# Patient Record
Sex: Female | Born: 1960
Health system: Southern US, Community
[De-identification: ages and names within clinical notes are randomized; demographics above are authoritative.]

## PROBLEM LIST (undated history)

## (undated) DIAGNOSIS — L409 Psoriasis, unspecified: Secondary | ICD-10-CM

## (undated) DIAGNOSIS — I951 Orthostatic hypotension: Secondary | ICD-10-CM

## (undated) DIAGNOSIS — C4490 Unspecified malignant neoplasm of skin, unspecified: Secondary | ICD-10-CM

## (undated) DIAGNOSIS — F419 Anxiety disorder, unspecified: Secondary | ICD-10-CM

## (undated) DIAGNOSIS — E079 Disorder of thyroid, unspecified: Secondary | ICD-10-CM

## (undated) DIAGNOSIS — M65839 Other synovitis and tenosynovitis, unspecified forearm: Secondary | ICD-10-CM

## (undated) DIAGNOSIS — M1990 Unspecified osteoarthritis, unspecified site: Secondary | ICD-10-CM

## (undated) DIAGNOSIS — M67439 Ganglion, unspecified wrist: Secondary | ICD-10-CM

## (undated) DIAGNOSIS — R42 Dizziness and giddiness: Secondary | ICD-10-CM

## (undated) DIAGNOSIS — R0602 Shortness of breath: Secondary | ICD-10-CM

## (undated) DIAGNOSIS — K5090 Crohn's disease, unspecified, without complications: Secondary | ICD-10-CM

## (undated) HISTORY — DX: Anxiety disorder, unspecified: F41.9

## (undated) HISTORY — DX: Disorder of thyroid, unspecified: E07.9

## (undated) HISTORY — DX: Dizziness and giddiness: R42

## (undated) HISTORY — DX: Shortness of breath: R06.02

## (undated) HISTORY — PX: OTHER SURGICAL HISTORY: SHX169

## (undated) HISTORY — DX: Psoriasis, unspecified: L40.9

## (undated) HISTORY — DX: Other synovitis and tenosynovitis, unspecified forearm: M65.839

## (undated) HISTORY — DX: Ganglion, unspecified wrist: M67.439

## (undated) HISTORY — DX: Orthostatic hypotension: I95.1

---

## 2001-04-11 HISTORY — PX: BREAST BIOPSY: SHX20

## 2002-04-11 HISTORY — PX: COLON SURGERY: SHX602

## 2002-04-11 HISTORY — PX: BREAST SURGERY: SHX581

## 2003-04-12 HISTORY — PX: REDUCTION MAMMAPLASTY: SUR839

## 2004-03-15 ENCOUNTER — Ambulatory Visit: Payer: Self-pay | Admitting: General Surgery

## 2004-03-19 ENCOUNTER — Ambulatory Visit: Payer: Self-pay | Admitting: General Surgery

## 2004-09-17 ENCOUNTER — Ambulatory Visit: Payer: Self-pay | Admitting: General Surgery

## 2005-03-16 ENCOUNTER — Ambulatory Visit: Payer: Self-pay | Admitting: General Surgery

## 2005-09-29 ENCOUNTER — Ambulatory Visit: Payer: Self-pay | Admitting: Internal Medicine

## 2005-12-27 ENCOUNTER — Ambulatory Visit: Payer: Self-pay | Admitting: Internal Medicine

## 2006-10-09 ENCOUNTER — Ambulatory Visit: Payer: Self-pay | Admitting: Internal Medicine

## 2007-10-10 ENCOUNTER — Ambulatory Visit: Payer: Self-pay | Admitting: Internal Medicine

## 2007-10-11 ENCOUNTER — Ambulatory Visit: Payer: Self-pay | Admitting: Internal Medicine

## 2007-10-17 ENCOUNTER — Ambulatory Visit: Payer: Self-pay | Admitting: Family Medicine

## 2007-11-06 ENCOUNTER — Ambulatory Visit: Payer: Self-pay | Admitting: Internal Medicine

## 2008-09-17 ENCOUNTER — Ambulatory Visit: Payer: Self-pay | Admitting: Internal Medicine

## 2008-10-30 ENCOUNTER — Ambulatory Visit: Payer: Self-pay | Admitting: Internal Medicine

## 2009-11-05 ENCOUNTER — Ambulatory Visit: Payer: Self-pay | Admitting: Internal Medicine

## 2010-12-16 ENCOUNTER — Ambulatory Visit: Payer: Self-pay

## 2010-12-17 ENCOUNTER — Ambulatory Visit: Payer: Self-pay

## 2011-01-26 ENCOUNTER — Ambulatory Visit: Payer: Self-pay

## 2011-03-18 ENCOUNTER — Ambulatory Visit: Payer: Self-pay

## 2011-03-23 ENCOUNTER — Ambulatory Visit: Payer: Self-pay

## 2011-12-08 ENCOUNTER — Ambulatory Visit: Payer: Self-pay

## 2011-12-08 LAB — CBC WITH DIFFERENTIAL/PLATELET
Basophil %: 0.7 %
Eosinophil %: 0.8 %
HCT: 38 % (ref 35.0–47.0)
Lymphocyte %: 22.2 %
MCH: 32.3 pg (ref 26.0–34.0)
Monocyte #: 0.5 x10 3/mm (ref 0.2–0.9)
Neutrophil #: 4.9 10*3/uL (ref 1.4–6.5)
Neutrophil %: 69.8 %
RBC: 4.06 10*6/uL (ref 3.80–5.20)
WBC: 7 10*3/uL (ref 3.6–11.0)

## 2011-12-08 LAB — URIC ACID: Uric Acid: 4.8 mg/dL (ref 2.6–6.0)

## 2011-12-15 ENCOUNTER — Ambulatory Visit: Payer: Self-pay | Admitting: Internal Medicine

## 2011-12-21 ENCOUNTER — Ambulatory Visit: Payer: Self-pay | Admitting: Internal Medicine

## 2012-05-29 ENCOUNTER — Ambulatory Visit: Payer: Self-pay | Admitting: Physician Assistant

## 2013-05-01 ENCOUNTER — Ambulatory Visit: Payer: Self-pay | Admitting: Family Medicine

## 2013-06-19 ENCOUNTER — Ambulatory Visit: Payer: Self-pay | Admitting: Physician Assistant

## 2013-07-08 ENCOUNTER — Ambulatory Visit: Payer: Self-pay

## 2013-11-21 DIAGNOSIS — R0602 Shortness of breath: Secondary | ICD-10-CM

## 2013-11-21 DIAGNOSIS — F419 Anxiety disorder, unspecified: Secondary | ICD-10-CM

## 2013-11-21 DIAGNOSIS — R42 Dizziness and giddiness: Secondary | ICD-10-CM

## 2013-11-21 HISTORY — DX: Shortness of breath: R06.02

## 2013-11-21 HISTORY — DX: Dizziness and giddiness: R42

## 2013-11-21 HISTORY — DX: Anxiety disorder, unspecified: F41.9

## 2013-11-22 DIAGNOSIS — I951 Orthostatic hypotension: Secondary | ICD-10-CM

## 2013-11-22 HISTORY — DX: Orthostatic hypotension: I95.1

## 2013-12-24 DIAGNOSIS — M65939 Unspecified synovitis and tenosynovitis, unspecified forearm: Secondary | ICD-10-CM

## 2013-12-24 DIAGNOSIS — M65839 Other synovitis and tenosynovitis, unspecified forearm: Secondary | ICD-10-CM

## 2013-12-24 DIAGNOSIS — M67439 Ganglion, unspecified wrist: Secondary | ICD-10-CM | POA: Insufficient documentation

## 2013-12-24 HISTORY — DX: Ganglion, unspecified wrist: M67.439

## 2013-12-24 HISTORY — DX: Unspecified synovitis and tenosynovitis, unspecified forearm: M65.939

## 2013-12-24 HISTORY — DX: Other synovitis and tenosynovitis, unspecified forearm: M65.839

## 2014-01-03 ENCOUNTER — Ambulatory Visit: Payer: Self-pay | Admitting: Unknown Physician Specialty

## 2014-03-11 ENCOUNTER — Ambulatory Visit: Payer: Self-pay

## 2014-03-11 LAB — CBC WITH DIFFERENTIAL/PLATELET
BASOS PCT: 0.7 %
Basophil #: 0.1 10*3/uL (ref 0.0–0.1)
EOS ABS: 0.1 10*3/uL (ref 0.0–0.7)
Eosinophil %: 0.8 %
HCT: 39.8 % (ref 35.0–47.0)
HGB: 13 g/dL (ref 12.0–16.0)
LYMPHS ABS: 1.5 10*3/uL (ref 1.0–3.6)
Lymphocyte %: 11.2 %
MCH: 30.1 pg (ref 26.0–34.0)
MCHC: 32.6 g/dL (ref 32.0–36.0)
MCV: 92 fL (ref 80–100)
Monocyte #: 0.6 x10 3/mm (ref 0.2–0.9)
Monocyte %: 4.2 %
NEUTROS ABS: 10.9 10*3/uL — AB (ref 1.4–6.5)
NEUTROS PCT: 83.1 %
Platelet: 172 10*3/uL (ref 150–440)
RBC: 4.32 10*6/uL (ref 3.80–5.20)
RDW: 13.1 % (ref 11.5–14.5)
WBC: 13.1 10*3/uL — AB (ref 3.6–11.0)

## 2014-03-11 LAB — COMPREHENSIVE METABOLIC PANEL
ANION GAP: 10 (ref 7–16)
AST: 28 U/L (ref 15–37)
Albumin: 3.9 g/dL (ref 3.4–5.0)
Alkaline Phosphatase: 77 U/L
BUN: 19 mg/dL — AB (ref 7–18)
Bilirubin,Total: 0.4 mg/dL (ref 0.2–1.0)
CALCIUM: 9.8 mg/dL (ref 8.5–10.1)
CO2: 27 mmol/L (ref 21–32)
Chloride: 101 mmol/L (ref 98–107)
Creatinine: 0.96 mg/dL (ref 0.60–1.30)
EGFR (African American): >60
EGFR (Non-African Amer.): >60
Glucose: 112 mg/dL — ABNORMAL HIGH (ref 65–99)
Osmolality: 279 (ref 275–301)
Potassium: 4.3 mmol/L (ref 3.5–5.1)
SGPT (ALT): 41 U/L
Sodium: 138 mmol/L (ref 136–145)
Total Protein: 7.8 g/dL (ref 6.4–8.2)

## 2014-03-11 LAB — LIPASE, BLOOD: Lipase: 130 U/L (ref 73–393)

## 2014-03-11 LAB — AMYLASE: AMYLASE: 36 U/L (ref 25–115)

## 2014-03-12 ENCOUNTER — Inpatient Hospital Stay: Payer: Self-pay | Admitting: Surgery

## 2014-03-12 LAB — URINALYSIS, COMPLETE
BACTERIA: NONE SEEN
Bilirubin,UR: NEGATIVE
Glucose,UR: NEGATIVE mg/dL (ref 0–75)
Ketone: NEGATIVE
Leukocyte Esterase: NEGATIVE
NITRITE: NEGATIVE
PROTEIN: NEGATIVE
Ph: 6 (ref 4.5–8.0)
Specific Gravity: 1.04 (ref 1.003–1.030)
Squamous Epithelial: <1
WBC UR: 2 /HPF (ref 0–5)

## 2014-03-12 LAB — BASIC METABOLIC PANEL
Anion Gap: 6 — ABNORMAL LOW (ref 7–16)
BUN: 8 mg/dL (ref 7–18)
CREATININE: 0.87 mg/dL (ref 0.60–1.30)
Calcium, Total: 8.3 mg/dL — ABNORMAL LOW (ref 8.5–10.1)
Chloride: 107 mmol/L (ref 98–107)
Co2: 28 mmol/L (ref 21–32)
EGFR (African American): >60
EGFR (Non-African Amer.): >60
GLUCOSE: 110 mg/dL — AB (ref 65–99)
OSMOLALITY: 280 (ref 275–301)
Potassium: 4.3 mmol/L (ref 3.5–5.1)
Sodium: 141 mmol/L (ref 136–145)

## 2014-03-13 LAB — BASIC METABOLIC PANEL
ANION GAP: 6 — AB (ref 7–16)
BUN: 7 mg/dL (ref 7–18)
CREATININE: 0.89 mg/dL (ref 0.60–1.30)
Calcium, Total: 8.2 mg/dL — ABNORMAL LOW (ref 8.5–10.1)
Chloride: 108 mmol/L — ABNORMAL HIGH (ref 98–107)
Co2: 28 mmol/L (ref 21–32)
Glucose: 108 mg/dL — ABNORMAL HIGH (ref 65–99)
OSMOLALITY: 282 (ref 275–301)
Potassium: 4.3 mmol/L (ref 3.5–5.1)
Sodium: 142 mmol/L (ref 136–145)

## 2014-03-13 LAB — CBC WITH DIFFERENTIAL/PLATELET
BASOS ABS: 0 10*3/uL (ref 0.0–0.1)
BASOS PCT: 0.7 %
EOS ABS: 0.2 10*3/uL (ref 0.0–0.7)
Eosinophil %: 4.1 %
HCT: 34.7 % — AB (ref 35.0–47.0)
HGB: 11.3 g/dL — AB (ref 12.0–16.0)
LYMPHS ABS: 2 10*3/uL (ref 1.0–3.6)
Lymphocyte %: 40.9 %
MCH: 30.8 pg (ref 26.0–34.0)
MCHC: 32.7 g/dL (ref 32.0–36.0)
MCV: 94 fL (ref 80–100)
MONOS PCT: 7 %
Monocyte #: 0.3 x10 3/mm (ref 0.2–0.9)
Neutrophil #: 2.3 10*3/uL (ref 1.4–6.5)
Neutrophil %: 47.3 %
PLATELETS: 135 10*3/uL — AB (ref 150–440)
RBC: 3.68 10*6/uL — ABNORMAL LOW (ref 3.80–5.20)
RDW: 13.5 % (ref 11.5–14.5)
WBC: 4.9 10*3/uL (ref 3.6–11.0)

## 2014-08-02 NOTE — H&P (Signed)
PATIENT NAME:  Ruth Morales, Ruth Morales MR#:  [id number] DATE OF BIRTH:  05/24/1960  DATE OF ADMISSION:  03/12/2014  CHIEF COMPLAINT: Abdominal pain.   HISTORY OF PRESENT ILLNESS: This is a patient who had the onset of abdominal pain and nausea at 1630 on September 1 less than 12 hours ago. She has never had an episode like this before but describes mid abdominal pain, nausea, no passing gas. Her last bowel movement was shortly before this onset of her symptoms. Of note, she has had a total colectomy with J pouch for ulcerative colitis. This was performed in 2004 by Dr. Marland KitchenDictation Anomaly) <<Karruda>> at [facility]. She has occasional pouchitis and takes Cipro periodically. She has taken 2 doses of Cipro in the last 10 days. She did have an episode of what she called obstruction in the immediate postoperative period. Possibly about 2 weeks after her surgery she had an ileostomy and that was ultimately closed. Her obstruction resolved without a nasogastric tube and with only hospitalization and resolved spontaneously without surgery. She states that she vomited after the CT scan this evening and her pain is much better now but still present. She has still not passed any gas. Denies melena or hematochezia.   PAST MEDICAL HISTORY: Ulcerative colitis and pouchitis.   PAST SURGICAL HISTORY: Subtotal colectomy, breast reduction, ileostomy closure.   ALLERGIES: BENADRYL, MORPHINE, PENICILLIN.   MEDICATIONS: See chart reconciliation.   FAMILY HISTORY: Noncontributory.   SOCIAL HISTORY: The patient works in an office. Does not smoke or drink.   REVIEW OF SYSTEMS: Ten system review was performed and negative with the exception of that mentioned in the HPI.   PHYSICAL EXAMINATION:  VITAL SIGNS: Pulse is 73, respirations 18, blood pressure 137/86, pain scale of 4, no temperature is available nor is a BMI or patient's weight or height.  GENERAL: She appears somewhat uncomfortable.  HEENT: No scleral icterus.   NECK: No palpable neck nodes.  CHEST: Clear to auscultation.  CARDIAC: Regular rate and rhythm.  ABDOMEN: Soft. There is a low Pfannenstiel-type incision which is Ruth Morales healed. I cannot see the incision or scar from ileostomy. The abdomen is nondistended, nontympanitic, essentially nontender. No guarding, no rebound, no percussion tenderness and nondistended.  EXTREMITIES: Without edema.  NEUROLOGIC: Grossly intact.  INTEGUMENT: No jaundice.   LABORATORY VALUES: Demonstrate a white blood cell count of 13.1, H and H of 13 and 40 with a platelet count of 172. Electrolytes are within normal limits. BUN slightly elevated at 19.   KUB and CT scan are personally reviewed demonstrating a fairly obvious obstruction in the right lower quadrant with dilated bowel proximal.   ASSESSMENT AND PLAN: This is a patient with a bowel obstruction. It has been going on for less than 12 hours, and in fact, after a single emesis, the patient feels better. I will hold off on placing a nasogastric tube although in all likelihood this will be required unless she spontaneously resolves.  My thoughts are that this patient may be resolving as she has had a single emesis only and feels better, but with the findings on CT scan, I would recommend repeating KUB after admission to the hospital, hydration, and re-examination. I will discuss the patient with Dr. Marina Gravel.  I have discussed this plan with the patient and her husband. They understood and agreed to proceed.   ____________________________ Jerrol Banana Burt Knack, MD rec:AT D: 03/12/2014 04:06:03 ET T: 03/12/2014 05:12:37 ET JOB#: [id number]  cc: Jerrol Banana. Burt Knack, MD, <Dictator>  Florene Glen MD ELECTRONICALLY SIGNED 03/12/2014 7:17

## 2014-08-02 NOTE — H&P (Signed)
Subjective/Chief Complaint abd pain   History of Present Illness mid abd [pain and nausea, single emesis, pain better started at 1630, last BM before that no flatus, single emesis after CT, now better   Past History PMH UC PSH total colectomy 2004, postop obstruction did not require surgery breast reduction   Past Med/Surgical Hx:  Ulcerative Colitis:   breast reduction:   Colectomy:   ALLERGIES:  Penicillin: Other  PCN: Other  Benadryl: Other  Morphine: Other  Family and Social History:  Family History Non-Contributory   Social History negative tobacco, negative ETOH, office   Place of Living Home   Review of Systems:  Fever/Chills No   Cough No   Abdominal Pain Yes   Diarrhea No   Constipation No   Nausea/Vomiting Yes   SOB/DOE No   Chest Pain No   Dysuria No   Tolerating Diet No  Nauseated  Vomiting   Medications/Allergies Reviewed Medications/Allergies reviewed   Physical Exam:  GEN uncomfortable   HEENT pink conjunctivae   NECK supple   RESP normal resp effort  clear BS   CARD regular rate   ABD denies tenderness  soft nondistended, nontender, scar   LYMPH negative neck   EXTR negative edema   SKIN normal to palpation   PSYCH alert, A+O to time, place, person, good insight   Lab Results: Routine UA:  02-Dec-15 03:03   Color (UA) Yellow  Clarity (UA) Clear  Glucose (UA) Negative  Bilirubin (UA) Negative  Ketones (UA) Negative  Specific Gravity (UA) 1.040  Blood (UA) 1+  pH (UA) 6.0  Protein (UA) Negative  Nitrite (UA) Negative  Leukocyte Esterase (UA) Negative (Result(s) reported on 12 Mar 2014 at 03:28AM.)  RBC (UA) 1 /HPF  WBC (UA) 2 /HPF  Bacteria (UA) NONE SEEN  Epithelial Cells (UA) <1 /HPF  Mucous (UA) PRESENT  Hyaline Cast (UA) 3 /LPF (Result(s) reported on 12 Mar 2014 at [facility].)   Radiology Results: XRay:    01-Dec-15 21:20, Abdomen 3-Way (Incl PA CXR) ([place])  Abdomen 3-Way (Incl PA CXR) ([place])   REASON FOR EXAM:    abdominal pain  COMMENTS:       PROCEDURE: MDR - MDR ABDOMEN 3-WAY (INCL PA [facility])  - Mar 11 2014  9:20PM     CLINICAL DATA:  Acute onset abdominal pain today, worsening.    EXAM:  ABDOMEN SERIES    COMPARISON:  None.    FINDINGS:  Single view of the chest demonstrates clear lungs and normal heart  size. No pneumothorax or pleural effusion.  Two views of the abdomen show no free intraperitoneal air. There  gas-filled and dilated loops of small bowel measuring up to 3.8 cm.  A markedly dilated loop of bowel is seen centrally in the pelvis  with extensive suture material just inferior to this loop. No free  intraperitoneal air is seen.     IMPRESSION:  Abnormal bowel gas pattern worrisome for obstruction. CT abdomen and  pelviswith contrast is recommended for further evaluation.    No acute cardiopulmonary disease.      Electronically Signed    By: Inge Rise M.D.    On: 03/11/2014 21:38         Verified By: Ramond Dial, M.D.,  CT:    02-Dec-15 01:33, CT Abdomen and Pelvis With Contrast  CT Abdomen and Pelvis With Contrast  REASON FOR EXAM:    (1) abd pain with h/o colectomy eval obstruction; (2)  abd pain with h/o colectom  COMMENTS:       PROCEDURE: CT  - CT ABDOMEN / PELVIS  W  - Mar 12 2014  1:33AM     CLINICAL DATA:  54 year old female with right-sided abdominal and  pelvic pain. History of ulcerative colitis and colectomy. Initial  encounter.    EXAM:  CT ABDOMEN AND PELVIS WITH CONTRAST    TECHNIQUE:  Multidetector CT imaging of the abdomen and pelvis was performed  using the standard protocol following bolus administration of  intravenous contrast.    CONTRAST:  100 cc intravenous Isovue 300    COMPARISON:  03/11/2014 radiographs.    FINDINGS:  Dilated proximal and mid small bowel loops are noted with collapsed  distal loops compatible with small bowel obstruction. The transition  is located within the right  abdomen without identifiable cause.  Inflammation along dilated small bowel loops distally noted. There  is no evidence of pneumoperitoneum or abscess.    Patient is status post colectomy.  The liver, spleen, adrenal glands, pancreas, gallbladder and kidneys  are unremarkable except for a nonobstructing 4 mm right lower pole  renal calculus.    There is no evidence of free fluid, enlarged lymph nodes, biliary  dilation or abdominal aortic aneurysm.    Bladder, uterus and adnexal regions are unremarkable.    No acute or suspicious bony abnormalities are identified.     IMPRESSION:  Small bowel obstruction with transition located in the right abdomen  without identifiable cause and may be related to an adhesion.  Inflammation along dilated distal small bowel loops noted without  pneumoperitoneum or abscess.      Electronically Signed    By: Hassan Rowan M.D.    On: 03/12/2014 02:57         Verified By: Lura Em, M.D.,    Assessment/Admission Diagnosis bowel obstruction single emesis and improvement in pain with benign exam will hold NG for now but may need it if vomits again reexamine will discuss with Dr Marina Gravel   Electronic Signatures: Florene Glen (MD)  (Signed 02-Dec-15 03:55)  Authored: CHIEF COMPLAINT and HISTORY, PAST MEDICAL/SURGIAL HISTORY, ALLERGIES, FAMILY AND SOCIAL HISTORY, REVIEW OF SYSTEMS, PHYSICAL EXAM, LABS, Radiology, ASSESSMENT AND PLAN   Last Updated: 02-Dec-15 03:55 by Florene Glen (MD)

## 2014-08-02 NOTE — Discharge Summary (Signed)
PATIENT NAME:  Ruth Morales, [place] MR#:  [id number] DATE OF BIRTH:  1961/01/12  DATE OF ADMISSION:  03/12/2014 DATE OF DISCHARGE:  03/13/2014    FINAL DIAGNOSES:  1.  Partial small bowel obstruction, resolved.  2.  History of ulcerative colitis status post total abdominal proctocolectomy with ileal pouch anal anastomosis approximately 12 years ago.   HOSPITAL COURSE:   The patient was admitted with what looked to be an early partial small bowel obstruction.  She had prompt resolution of her pain.  Follow-up x-rays later that day demonstrated fairly nonspecific pattern. Her diet was able to be advanced.  By hospital day 1, the patient was doing much better, passing gas, hungry.  Her diet was able to be advanced.  Her abdomen remained benign.  The patient was therefore discharged home in stable condition with outpatient follow-up as needed.  Medication reconciliation form was performed.  follow up in our office as needed.     ____________________________ Jeannette How Marina Gravel, MD mab:DT D: 03/16/2014 11:17:05 ET T: 03/16/2014 17:53:05 ET JOB#: [id number]  cc: Elta [place] A. Marina Gravel, MD, <Dictator> Hortencia Conradi MD ELECTRONICALLY SIGNED 03/16/2014 18:49

## 2015-01-15 ENCOUNTER — Encounter: Payer: Self-pay | Admitting: Emergency Medicine

## 2015-01-15 ENCOUNTER — Ambulatory Visit: Payer: 59

## 2015-01-15 ENCOUNTER — Ambulatory Visit
Admission: EM | Admit: 2015-01-15 | Discharge: 2015-01-15 | Disposition: A | Discharge status: Home / Self Care | Payer: 59 | Attending: Family Medicine | Admitting: Family Medicine

## 2015-01-15 DIAGNOSIS — N390 Urinary tract infection, site not specified: Secondary | ICD-10-CM

## 2015-01-15 DIAGNOSIS — R109 Unspecified abdominal pain: Secondary | ICD-10-CM

## 2015-01-15 HISTORY — DX: Crohn's disease, unspecified, without complications: K50.90

## 2015-01-15 LAB — URINALYSIS COMPLETE WITH MICROSCOPIC (ARMC ONLY)
Bilirubin Urine: NEGATIVE
Glucose, UA: NEGATIVE mg/dL
Hgb urine dipstick: NEGATIVE
KETONES UR: NEGATIVE mg/dL
Leukocytes, UA: NEGATIVE
NITRITE: NEGATIVE
PH: 5.5 (ref 5.0–8.0)
PROTEIN: NEGATIVE mg/dL
Specific Gravity, Urine: 1.02 (ref 1.005–1.030)

## 2015-01-15 MED ORDER — SULFAMETHOXAZOLE-TRIMETHOPRIM 800-160 MG PO TABS: 1.0000 | ORAL_TABLET | Freq: Two times a day (BID) | ORAL | Status: AC

## 2015-01-15 MED ORDER — TRAMADOL HCL 50 MG PO TABS: 50.0000 mg | ORAL_TABLET | Freq: Three times a day (TID) | ORAL | Status: DC | PRN

## 2015-01-15 NOTE — ED Notes (Signed)
Pelvic pain, lower back for 1 day

## 2015-01-15 NOTE — Discharge Instructions (Signed)
Urinary Tract Infection A urinary tract infection (UTI) can occur any place along the urinary tract. The tract includes the kidneys, ureters, bladder, and urethra. A type of germ called bacteria often causes a UTI. UTIs are often helped with antibiotic medicine.  HOME CARE   If given, take antibiotics as told by your doctor. Finish them even if you start to feel better.  Drink enough fluids to keep your pee (urine) clear or pale yellow.  Avoid tea, drinks with caffeine, and bubbly (carbonated) drinks.  Pee often. Avoid holding your pee in for a long time.  Pee before and after having sex (intercourse).  Wipe from front to back after you poop (bowel movement) if you are a woman. Use each tissue only once. GET HELP RIGHT AWAY IF:   You have back pain.  You have lower belly (abdominal) pain.  You have chills.  You feel sick to your stomach (nauseous).  You throw up (vomit).  Your burning or discomfort with peeing does not go away.  You have a fever.  Your symptoms are not better in 3 days. MAKE SURE YOU:   Understand these instructions.  Will watch your condition.  Will get help right away if you are not doing well or get worse.   This information is not intended to replace advice given to you by your health care provider. Make sure you discuss any questions you have with your health care provider.   Document Released: 09/14/2007 Document Revised: 04/18/2014 Document Reviewed: 10/27/2011 Elsevier Interactive Patient Education 2016 Elsevier Inc.  Flank Pain Flank pain refers to pain that is located on the side of the body between the upper abdomen and the back. The pain may occur over a short period of time (acute) or may be long-term or reoccurring (chronic). It may be mild or severe. Flank pain can be caused by many things. CAUSES  Some of the more common causes of flank pain include:  Muscle strains.   Muscle spasms.   A disease of your spine (vertebral disk  disease).   A lung infection (pneumonia).   Fluid around your lungs (pulmonary edema).   A kidney infection.   Kidney stones.   A very painful skin rash caused by the chickenpox virus (shingles).   Gallbladder disease.  [place] care will depend on the cause of your pain. In general,  Rest as directed by your caregiver.  Drink enough fluids to keep your urine clear or pale yellow.  Only take over-the-counter or prescription medicines as directed by your caregiver. Some medicines may help relieve the pain.  Tell your caregiver about any changes in your pain.  Follow up with your caregiver as directed. SEEK IMMEDIATE MEDICAL CARE IF:   Your pain is not controlled with medicine.   You have new or worsening symptoms.  Your pain increases.   You have abdominal pain.   You have shortness of breath.   You have persistent nausea or vomiting.   You have swelling in your abdomen.   You feel faint or pass out.   You have blood in your urine.  You have a fever or persistent symptoms for more than 2-3 days.  You have a fever and your symptoms suddenly get worse. MAKE SURE YOU:   Understand these instructions.  Will watch your condition.  Will get help right away if you are not doing well or get worse.   This information is not intended to replace advice given to  you by your health care provider. Make sure you discuss any questions you have with your health care provider.   Document Released: 05/19/2005 Document Revised: 12/21/2011 Document Reviewed: 11/10/2011 Elsevier Interactive Patient Education [employer].

## 2015-01-15 NOTE — ED Provider Notes (Signed)
[facility] Emergency Department Provider Note  ____________________________________________  Time seen: Approximately 8:49 AM  I have reviewed the triage vital signs and the nursing notes.   HISTORY  Chief Complaint Pelvic Pain   HPI Ruth Morales is a 54 y.o. female presents with a complaint of 1 day of pelvic discomfort. Patient states yesterday afternoon she was sitting still and noticed some right back pain and states that it is a very mild ache pain. States that she got up and stretch and the pain seemed to feel better. States yesterday as the day progressed she noticed that she had to feel like she had to urinate more frequently. Patient also states that when she went to urinate she had some discomfort urinating and states that she had some discomfort in between urinating. Patient states that this discomfort was located at her very low mid abdomen. Denies vaginal discomfort. Denies vaginal bleeding, discharge, redness or irritation. States she is postmenopausal. Denies recent sexual encounter. States current discomfort is 2 out of 10. Denies difficulty urinating or urinary stream. Denies hematuria. Denies history of kidney stones. Reports UTI "years ago."   Patient reports continues to move bowels well and normally. States last bowel movement this morning. States continued to pass flatus without any difficulty. Denies nausea, vomiting, diarrhea, constipation, chest pain, shortness of breath, current back pain. Patient reports she currently has no back pain. Denies fall or injury. Denies fever, pain radiation or rash.  Reports last BM this am, normal color and consistency. Denies blood in stool or toilet or black stool.    Past Medical History  Diagnosis Date  . Crohn disease ([place])     There are no active problems to display for this patient.   Past Surgical History  Procedure Laterality Date  . Colon surgery    . Breast surgery      breast reduction   History of ulcerative colitis status post colectomy and ileostomy reversal.    Current Outpatient Rx  Name  Route  Sig  Dispense  Refill  . loperamide (IMODIUM) 2 MG capsule   Oral   Take 2 mg by mouth as needed for diarrhea or loose stools.           Allergies Penicillins  No family history on file.  Social History Social History  Substance Use Topics  . Smoking status: Former Research scientist (life sciences)  . Smokeless tobacco: Never Used  . Alcohol Use: No    Review of Systems Constitutional: No fever/chills Eyes: No visual changes. ENT: No sore throat. Cardiovascular: Denies chest pain. Respiratory: Denies shortness of breath. Gastrointestinal: No abdominal pain.  No nausea, no vomiting.  No diarrhea.  No constipation. Genitourinary: positive for dysuria. Musculoskeletal: positive for back pain. Skin: Negative for rash. Neurological: Negative for headaches, focal weakness or numbness.  10-point ROS otherwise negative.  ____________________________________________   PHYSICAL EXAM:  VITAL SIGNS: ED Triage Vitals  Enc Vitals Group     BP 01/15/15 0828 143/82 mmHg     Pulse Rate 01/15/15 0828 76     Resp 01/15/15 0828 16     Temp 01/15/15 0828 97.5 F (36.4 C)     Temp Source 01/15/15 0828 Tympanic     SpO2 01/15/15 0828 100 %     Weight 01/15/15 0828 176 lb (79.833 kg)     Height 01/15/15 0828 5\' 9"  (1.753 m)     Head Cir --      Peak Flow --  Pain Score 01/15/15 0828 3     Pain Loc --      Pain Edu? --      Excl. in [place]? --     Constitutional: Alert and oriented. Well appearing and in no acute distress. Eyes: Conjunctivae are normal. PERRL. EOMI. Head: Atraumatic.  Nose: No congestion/rhinnorhea.  Mouth/Throat: Mucous membranes are moist.  Oropharynx non-erythematous. Neck: No stridor.  No cervical spine tenderness to palpation. Hematological/Lymphatic/Immunilogical: No cervical lymphadenopathy. Cardiovascular: Normal rate, regular rhythm. Grossly normal heart  sounds.  Good peripheral circulation. Respiratory: Normal respiratory effort.  No retractions. Lungs CTAB. Gastrointestinal: Soft. Minimal suprapubic TTP. Abdomen otherwise soft and nontender. No distention. Normal Bowel sounds.  No abdominal bruits. No CVA tenderness. Musculoskeletal: No lower or upper extremity tenderness nor edema.  No joint effusions. Bilateral pedal pulses equal and easily palpated. No midline cervical, thoracic or lumbar TTP.  Neurologic:  Normal speech and language. No gross focal neurologic deficits are appreciated. No gait instability. Skin:  Skin is warm, dry and intact. No rash noted. Psychiatric: Mood and affect are normal. Speech and behavior are normal.  ____________________________________________   LABS (all labs ordered are listed, but only abnormal results are displayed)  Labs Reviewed  URINALYSIS COMPLETEWITH MICROSCOPIC ([place]) - Abnormal; Notable for the following:    Color, Urine STRAW (*)    Squamous Epithelial / LPF Bacteria 0-5 (*) RARE    All other components within normal limits    RADIOLOGY  EXAM: ABDOMEN - 1 VIEW  COMPARISON: March 12, 2014  FINDINGS: There are multiple surgical clips in the upper abdomen and pelvic regions. There is a 5 mm calcification in the upper right pelvis which was not present on prior study. No other abnormal calcifications are identified. There is moderate stool in the colon. The bowel gas pattern is normal.  IMPRESSION: 5 mm calcification in the upper right pelvis. Suspect phlebolith, although a ureteral calculus could present in this manner. There are multiple surgical clips present. Bowel gas pattern unremarkable. No obstruction or free air apparent on this supine examination.   Electronically Signed By: Lowella Grip III M.D. On: 01/15/2015 09:38      I, Marylene Land, personally viewed and evaluated these images (plain radiographs) as part of my medical decision making.    _________________________________________   INITIAL IMPRESSION / ASSESSMENT AND PLAN / ED COURSE  Pertinent labs & imaging results that were available during my care of the patient were reviewed by me and considered in my medical decision making (see chart for details).   Very well-appearing patient. No acute distress. Presents for the complaint of one day of right lower back pain which is now resolved ( as of last night)  as well as some urinary frequency and suprapubic discomfort. Reports continues to eat and drink well. Denies fevers. Very mild suprapubic tenderness to palpation. Abdomen otherwise soft and nontender. Normal bowel sounds. Reports last BM this am.   Discussed patient and plan of with Dr Alveta Heimlich.   Patient reexamined. Abdomen soft and nontender. No suprapubic tenderness at this time. Patient states she is currently in no pain. Denies pain at this time. Denies flank pain. Urinalysis with with rare bacteria present. KUB reviewed. 5 mm calcification in the upper right pelvis. Per radiologist's suspect phlebolith, although a ureteral calculus could be present in this manner. There are multiple surgical clips present bowel gas pattern unremarkable. No instruction or  free air apparent on the supine examination.   After reviewing the x-ray and  urinalysis as well as reexamined patient. Patient very well-appearing and denies pain. States even yesterday when pain was present is very minimal. Suspect urinary tract infection other concern for possible ureteral calculus. Urine strainer given to patient. Patient denies pain medicine needed this time. States that she would "not even take ibuprofen at this time because it does not hurt.Marland Kitchen "Will treat patient with 3 day course of Bactrim, culture urine as well as use urine strainer. Prn tramadol. Directed to drink plenty of water. Rest. Patient to follow-up with primary care physician Dr. Humphrey Rolls next week. Discussed follow up with Primary care  physician. Discussed follow up and return parameters including no resolution or any worsening concerns. Patient verbalized understanding and agreed to plan.    ____________________________________________   FINAL CLINICAL IMPRESSION(S) / ED DIAGNOSES  Final diagnoses:  UTI (lower urinary tract infection)  Flank pain       Marylene Land, NP 01/15/15 1135

## 2015-01-17 LAB — URINE CULTURE: Special Requests: NORMAL

## 2015-01-19 NOTE — ED Notes (Signed)
Final report of UA culture shows in significant growth= negative report

## 2015-02-03 ENCOUNTER — Ambulatory Visit: Payer: Self-pay | Admitting: Obstetrics and Gynecology

## 2015-02-05 ENCOUNTER — Ambulatory Visit (INDEPENDENT_AMBULATORY_CARE_PROVIDER_SITE_OTHER): Payer: 59 | Admitting: Obstetrics and Gynecology

## 2015-02-05 ENCOUNTER — Encounter: Payer: Self-pay | Admitting: Obstetrics and Gynecology

## 2015-02-05 VITALS — BP 108/75 | HR 80 | Resp 16 | Ht 69.0 in | Wt 177.2 lb

## 2015-02-05 DIAGNOSIS — N200 Calculus of kidney: Secondary | ICD-10-CM

## 2015-02-05 LAB — URINALYSIS, COMPLETE
Bilirubin, UA: NEGATIVE
GLUCOSE, UA: NEGATIVE
Ketones, UA: NEGATIVE
Nitrite, UA: NEGATIVE
PROTEIN UA: NEGATIVE
Specific Gravity, UA: 1.02 (ref 1.005–1.030)
Urobilinogen, Ur: 0.2 mg/dL (ref 0.2–1.0)
pH, UA: 5 (ref 5.0–7.5)

## 2015-02-05 LAB — MICROSCOPIC EXAMINATION
BACTERIA UA: NONE SEEN
RENAL EPITHEL UA: NONE SEEN /HPF

## 2015-02-05 MED ORDER — TAMSULOSIN HCL 0.4 MG PO CAPS: 0.4000 mg | ORAL_CAPSULE | Freq: Every day | ORAL | Status: DC

## 2015-02-05 NOTE — Progress Notes (Signed)
02/05/2015 1:37 PM   Ruth Morales [birth year] [id number]  Referring provider: No referring provider defined for this encounter.  Chief Complaint  Patient presents with  . Establish Care    Urgent Care ref.  . Nephrolithiasis    HPI: Patient is a 54 year old female presenting today for follow-up after being seen at an urgent care on 01/15/15 with complaints of right flank pain radiating to right lower quadrant. A KUB was performed showing a 5 mm calcification in the upper right pelvis. It was suspected to be a phlebolith although a ureteral calculus could also present in this manner. Patient reports the pain has been mild and intermittent since. She was prescribed tramadol and has only required to take 2 tablets. She denies any urinary symptoms and specifically no dysuria gross hematuria.  No fevers. No previous history of renal stones.  PMH: Past Medical History  Diagnosis Date  . Crohn disease ([place])   . Hypotension, postural 11/22/2013  . Extensor tenosynovitis of wrist 12/24/2013  . Ganglion cyst of wrist 12/24/2013  . Anxiety 11/21/2013  . Breath shortness 11/21/2013  . Dizziness 11/21/2013    Surgical History: Past Surgical History  Procedure Laterality Date  . Colon surgery    . Breast surgery      breast reduction    Home Medications:    Medication List       This list is accurate as of: 02/05/15  1:37 PM.  Always use your most recent med list.               ciprofloxacin 500 MG tablet  Commonly known as:  CIPRO     cyanocobalamin 1000 MCG/ML injection  Commonly known as:  (VITAMIN B-12)  Inject into the muscle.     loperamide 2 MG capsule  Commonly known as:  IMODIUM  Take 2 mg by mouth as needed for diarrhea or loose stools.     tamsulosin 0.4 MG Caps capsule  Commonly known as:  FLOMAX  Take 1 capsule (0.4 mg total) by mouth daily.     traMADol 50 MG tablet  Commonly known as:  ULTRAM  Take 1 tablet (50 mg total) by mouth every 8 (eight) hours as  needed (Do not drive or operate machinery while taking as can cause drowsiness.).     Vitamin D (Ergocalciferol) 50000 UNITS Caps capsule  Commonly known as:  DRISDOL  Take 5,000 Units by mouth daily.        Allergies:  Allergies  Allergen Reactions  . Morphine     Other reaction(s): Other (See Comments) Hallucination.  Marland Kitchen Penicillins Anaphylaxis    headache    Family History: Family History  Problem Relation Age of Onset  . Diabetes Father   . Diabetes Mother   . Heart disease Maternal Grandmother     Social History:  reports that she has quit smoking. She has never used smokeless tobacco. She reports that she does not drink alcohol. Her drug history is not on file.  ROS: UROLOGY Frequent Urination?: No Hard to postpone urination?: No Burning/pain with urination?: No Get up at night to urinate?: No Leakage of urine?: No Urine stream starts and stops?: No Trouble starting stream?: No Do you have to strain to urinate?: Yes Blood in urine?: No Urinary tract infection?: Yes Sexually transmitted disease?: No Injury to kidneys or bladder?: No Painful intercourse?: Yes Weak stream?: No Currently pregnant?: No Vaginal bleeding?: No Last menstrual period?: 2013  Gastrointestinal Nausea?: No Vomiting?: No Indigestion/heartburn?: No  Diarrhea?: Yes Constipation?: No  Constitutional Fever: No Night sweats?: No Weight loss?: No Fatigue?: No  Skin Skin rash/lesions?: No Itching?: No  Eyes Blurred vision?: No Double vision?: No  Ears/Nose/Throat Sore throat?: No Sinus problems?: No  Hematologic/Lymphatic Swollen glands?: No Easy bruising?: No  Cardiovascular Leg swelling?: No Chest pain?: No  Respiratory Cough?: No Shortness of breath?: No  Endocrine Excessive thirst?: No  Musculoskeletal Back pain?: No Joint pain?: No  Neurological Headaches?: No Dizziness?: No  Psychologic Depression?: No Anxiety?: No  Physical Exam: BP 108/75  mmHg  Pulse 80  Resp 16  Ht 5\' 9"  (1.753 m)  Wt 177 lb 3.2 oz (80.377 kg)  BMI 26.16 kg/m2  Constitutional:  Alert and oriented, No acute distress. HEENT: [place] AT, moist mucus membranes.  Trachea midline, no masses. Cardiovascular: No clubbing, cyanosis, or edema. Respiratory: Normal respiratory effort, no increased work of breathing. GI: Abdomen is soft, nontender, nondistended, no abdominal masses GU: No CVA tenderness. Skin: No rashes, bruises or suspicious lesions. Lymph: No cervical or inguinal adenopathy. Neurologic: Grossly intact, no focal deficits, moving all 4 extremities. Psychiatric: Normal mood and affect.  Laboratory Data:   Urinalysis    Component Value Date/Time   COLORURINE STRAW* 01/15/2015 0830   COLORURINE Yellow 03/12/2014 0303   APPEARANCEUR CLEAR 01/15/2015 0830   APPEARANCEUR Clear 03/12/2014 0303   LABSPEC 1.020 01/15/2015 0830   LABSPEC 1.040 03/12/2014 0303   PHURINE 5.5 01/15/2015 0830   PHURINE 6.0 03/12/2014 0303   GLUCOSEU NEGATIVE 01/15/2015 0830   GLUCOSEU Negative 03/12/2014 0303   HGBUR NEGATIVE 01/15/2015 0830   HGBUR 1+ 03/12/2014 0303   BILIRUBINUR NEGATIVE 01/15/2015 0830   BILIRUBINUR Negative 03/12/2014 0303   KETONESUR NEGATIVE 01/15/2015 0830   KETONESUR Negative 03/12/2014 0303   PROTEINUR NEGATIVE 01/15/2015 0830   PROTEINUR Negative 03/12/2014 0303   NITRITE NEGATIVE 01/15/2015 0830   NITRITE Negative 03/12/2014 0303   LEUKOCYTESUR NEGATIVE 01/15/2015 0830   LEUKOCYTESUR Negative 03/12/2014 0303    Pertinent Imaging:  CLINICAL DATA: Right flank pain for 2 days  EXAM: ABDOMEN - 1 VIEW  COMPARISON: March 12, 2014  FINDINGS: There are multiple surgical clips in the upper abdomen and pelvic regions. There is a 5 mm calcification in the upper right pelvis which was not present on prior study. No other abnormal calcifications are identified. There is moderate stool in the colon. The bowel gas pattern is  normal.  IMPRESSION: 5 mm calcification in the upper right pelvis. Suspect phlebolith, although a ureteral calculus could present in this manner. There are multiple surgical clips present. Bowel gas pattern unremarkable. No obstruction or free air apparent on this supine examination.   Electronically Signed  By: Lowella Grip III M.D.  On: 01/15/2015 09:38      Assessment & Plan:    1. Nephrolithiasis- UA unremarkable. Possible 73mm right distal ureteral calculus vs phlebolith. Patient with continued intermittent pain. CT Renal Stone Protocol ordered. Patient advised to seek immediate medical attention for fevers, uncontrolled pain or vomiting. Tamsulosin prescribed. - Urinalysis, Complete   Return for CT results.  These notes generated with voice recognition software. I apologize for typographical errors.  Herbert Moors, [facility] [address] [place], [place] [address] [phone]

## 2015-02-17 ENCOUNTER — Ambulatory Visit
Admission: RE | Admit: 2015-02-17 | Discharge: 2015-02-17 | Disposition: A | Discharge status: Home / Self Care | Payer: 59 | Source: Ambulatory Visit | Attending: Obstetrics and Gynecology | Admitting: Obstetrics and Gynecology

## 2015-02-17 DIAGNOSIS — N201 Calculus of ureter: Secondary | ICD-10-CM | POA: Insufficient documentation

## 2015-02-17 DIAGNOSIS — N200 Calculus of kidney: Secondary | ICD-10-CM | POA: Diagnosis present

## 2015-02-17 DIAGNOSIS — K429 Umbilical hernia without obstruction or gangrene: Secondary | ICD-10-CM | POA: Diagnosis not present

## 2015-02-19 ENCOUNTER — Ambulatory Visit (INDEPENDENT_AMBULATORY_CARE_PROVIDER_SITE_OTHER): Payer: 59 | Admitting: Obstetrics and Gynecology

## 2015-02-19 ENCOUNTER — Encounter: Payer: Self-pay | Admitting: Obstetrics and Gynecology

## 2015-02-19 VITALS — BP 114/73 | HR 76 | Resp 16 | Ht 69.0 in | Wt 176.6 lb

## 2015-02-19 DIAGNOSIS — N200 Calculus of kidney: Secondary | ICD-10-CM

## 2015-02-19 LAB — URINALYSIS, COMPLETE
Bilirubin, UA: NEGATIVE
GLUCOSE, UA: NEGATIVE
KETONES UA: NEGATIVE
NITRITE UA: NEGATIVE
PH UA: 6.5 (ref 5.0–7.5)
Protein, UA: NEGATIVE
Specific Gravity, UA: 1.02 (ref 1.005–1.030)
Urobilinogen, Ur: 0.2 mg/dL (ref 0.2–1.0)

## 2015-02-19 LAB — MICROSCOPIC EXAMINATION
Bacteria, UA: NONE SEEN
RBC MICROSCOPIC, UA: NONE SEEN /HPF (ref 0–?)

## 2015-02-19 NOTE — Progress Notes (Signed)
02/19/2015 1:32 PM   Ruth Morales May 09, 1960 [id number]  Referring provider: Christie Nottingham, [place], [place] [address]  Chief Complaint  Patient presents with  . Nephrolithiasis  . Results    HPI: Patient is a 54 year old female with a history of right flank pain presenting today to review her recent CT results. CT performed 02/17/15 shows a 5 x 4 mm distal RIGHT ureteral calculus several cm above the ureterovesical junction without hydronephrosis or hydroureter. Patient continues to denies fevers or gross hematuria.  Previous History: Patient is a 54 year old female presenting today for follow-up after being seen at an urgent care on 01/15/15 with complaints of right flank pain radiating to right lower quadrant. A KUB was performed showing a 5 mm calcification in the upper right pelvis. It was suspected to be a phlebolith although a ureteral calculus could also present in this manner. Patient reports the pain has been mild and intermittent since. She was prescribed tramadol and has only required to take 2 tablets. She denies any urinary symptoms and specifically no dysuria gross hematuria. No fevers. No previous history of renal stones.    PMH: Past Medical History  Diagnosis Date  . Crohn disease ([place])   . Hypotension, postural 11/22/2013  . Extensor tenosynovitis of wrist 12/24/2013  . Ganglion cyst of wrist 12/24/2013  . Anxiety 11/21/2013  . Breath shortness 11/21/2013  . Dizziness 11/21/2013    Surgical History: Past Surgical History  Procedure Laterality Date  . Colon surgery    . Breast surgery      breast reduction    Home Medications:    Medication List       This list is accurate as of: 02/19/15  1:32 PM.  Always use your most recent med list.               ciprofloxacin 500 MG tablet  Commonly known as:  CIPRO     cyanocobalamin 1000 MCG/ML injection  Commonly known as:  (VITAMIN B-12)  Inject into the muscle.     loperamide 2 MG capsule   Commonly known as:  IMODIUM  Take 2 mg by mouth as needed for diarrhea or loose stools.     tamsulosin 0.4 MG Caps capsule  Commonly known as:  FLOMAX  Take 1 capsule (0.4 mg total) by mouth daily.     traMADol 50 MG tablet  Commonly known as:  ULTRAM  Take 1 tablet (50 mg total) by mouth every 8 (eight) hours as needed (Do not drive or operate machinery while taking as can cause drowsiness.).     Vitamin D (Ergocalciferol) 50000 UNITS Caps capsule  Commonly known as:  DRISDOL  Take 5,000 Units by mouth daily.        Allergies:  Allergies  Allergen Reactions  . Morphine     Other reaction(s): Other (See Comments) Hallucination.  Marland Kitchen Penicillins Anaphylaxis    headache    Family History: Family History  Problem Relation Age of Onset  . Diabetes Father   . Diabetes Mother   . Heart disease Maternal Grandmother     Social History:  reports that she has quit smoking. She has never used smokeless tobacco. She reports that she does not drink alcohol. Her drug history is not on file.  ROS: UROLOGY Frequent Urination?: No Hard to postpone urination?: No Burning/pain with urination?: No Get up at night to urinate?: No Leakage of urine?: No Urine stream starts and stops?: No Trouble starting stream?:  No Do you have to strain to urinate?: No Blood in urine?: No Urinary tract infection?: No Sexually transmitted disease?: No Injury to kidneys or bladder?: No Painful intercourse?: No Weak stream?: No Currently pregnant?: No Vaginal bleeding?: No Last menstrual period?: n  Gastrointestinal Nausea?: No Vomiting?: No Indigestion/heartburn?: No Diarrhea?: No Constipation?: No  Constitutional Fever: No Night sweats?: No Weight loss?: No Fatigue?: No  Skin Skin rash/lesions?: No Itching?: No  Eyes Blurred vision?: No Double vision?: No  Ears/Nose/Throat Sore throat?: No Sinus problems?: No  Hematologic/Lymphatic Swollen glands?: No Easy bruising?:  No  Cardiovascular Leg swelling?: No Chest pain?: No  Respiratory Cough?: No Shortness of breath?: No  Endocrine Excessive thirst?: No  Musculoskeletal Back pain?: No Joint pain?: No  Neurological Headaches?: No Dizziness?: No  Psychologic Depression?: No Anxiety?: No  Physical Exam: BP 114/73 mmHg  Pulse 76  Resp 16  Ht 5\' 9"  (1.753 m)  Wt 176 lb 9.6 oz (80.105 kg)  BMI 26.07 kg/m2  Constitutional:  Alert and oriented, No acute distress. HEENT: [place] AT, moist mucus membranes.  Trachea midline, no masses. Cardiovascular: No clubbing, cyanosis, or edema, RRR Respiratory: Normal respiratory effort, no increased work of breathing, CTAB GI: Abdomen is soft, nontender, nondistended, no abdominal masses GU: No CVA tenderness.  Skin: No rashes, bruises or suspicious lesions. Lymph: No cervical or inguinal adenopathy. Neurologic: Grossly intact, no focal deficits, moving all 4 extremities. Psychiatric: Normal mood and affect.  Laboratory Data:   Urinalysis    Component Value Date/Time   COLORURINE STRAW* 01/15/2015 0830   COLORURINE Yellow 03/12/2014 0303   APPEARANCEUR CLEAR 01/15/2015 0830   APPEARANCEUR Clear 03/12/2014 0303   LABSPEC 1.020 01/15/2015 0830   LABSPEC 1.040 03/12/2014 0303   PHURINE 5.5 01/15/2015 0830   PHURINE 6.0 03/12/2014 0303   GLUCOSEU Negative 02/19/2015 0842   GLUCOSEU Negative 03/12/2014 0303   HGBUR NEGATIVE 01/15/2015 0830   HGBUR 1+ 03/12/2014 0303   BILIRUBINUR Negative 02/19/2015 0842   BILIRUBINUR NEGATIVE 01/15/2015 0830   BILIRUBINUR Negative 03/12/2014 0303   KETONESUR NEGATIVE 01/15/2015 0830   KETONESUR Negative 03/12/2014 0303   PROTEINUR NEGATIVE 01/15/2015 0830   PROTEINUR Negative 03/12/2014 0303   NITRITE Negative 02/19/2015 0842   NITRITE NEGATIVE 01/15/2015 0830   NITRITE Negative 03/12/2014 0303   LEUKOCYTESUR 1+* 02/19/2015 0842   LEUKOCYTESUR NEGATIVE 01/15/2015 0830   LEUKOCYTESUR Negative 03/12/2014 0303     Pertinent Imaging: CLINICAL DATA: RIGHT flank pain since 01/15/2015, no gross hematuria, nephrolithiasis; personal history of Crohn's disease with total colectomy, former smoker  EXAM: CT ABDOMEN AND PELVIS WITHOUT CONTRAST  TECHNIQUE: Multidetector CT imaging of the abdomen and pelvis was performed following the standard protocol without IV contrast. Sagittal and coronal MPR images reconstructed from axial data set.  COMPARISON: 03/12/2014  FINDINGS: Lung bases clear.  Liver, gallbladder, spleen, pancreas, kidneys, and adrenal glands grossly unremarkable for technique.  Specifically, no RIGHT hydronephrosis or RIGHT renal calculus are identified.  However, newly seen is a 5 x 4 mm distal RIGHT ureteral calculus several cm above the ureterovesical junction ; this was previously located at the inferior pole of the RIGHT kidney.  No significant ureteral dilatation.  Prior colectomy with ileorectal versus ileoanal anastomosis.  Visualized bowel loops are unopacified and incompletely distended, making it impossible to exclude subtle wall thickening at multiple sites, including the distal anastomosis.  No definite areas of bowel wall thickening, dilatation, or point of obstruction seen.  Stomach unremarkable.  Scattered normal sized para-aortic and  mesenteric lymph nodes.  Minimal atherosclerotic calcification.  Unremarkable uterus and adnexa.  No mass, adenopathy, free air or free fluid.  Tiny umbilical hernia containing fat.  Osseous structures unremarkable.  IMPRESSION: Post colectomy as above.  5 x 4 mm distal RIGHT ureteral calculus several cm above the ureterovesical junction without hydronephrosis or hydroureter.  Tiny umbilical hernia containing fat. Electronically Signed  By: Lavonia Dana M.D.  On: 02/17/2015 10:35  Assessment & Plan:  1.   Nephrolithiasis-  5 x 4 mm distal RIGHT ureteral calculus several cm above  the ureterovesical junction without hydronephrosis or hydroureter. Patient offered continued medical management in hopes that she will pass the stone spontaneously. She opted to be scheduled for stone removal procedure. We discussed various treatment options including ESWL vs. ureteroscopy, laser lithotripsy, and stent. We discussed the risks and benefits of both including bleeding, infection, damage to surrounding structures, efficacy with need for possible further intervention, and need for temporary ureteral stent.We discussed general stone prevention techniques including drinking plenty water with goal of producing 2.5 L urine daily, increased citric acid intake, avoidance of high oxalate containing foods, and decreased salt intake.  Information about dietary recommendations given today.  Patient elected to proceed with ESWL.  Risks and benefits reviewed including damage to surrounding organs, bleeding, infection and anesthesia risk. Patient states understanding and willingness to proceed.  There are no diagnoses linked to this encounter.  No Follow-up on file.  These notes generated with voice recognition software. I apologize for typographical errors.  Herbert Moors, [facility] [address], [place], [place] [address] [phone]

## 2015-02-19 NOTE — Patient Instructions (Signed)
Lithotripsy Lithotripsy is a treatment that can sometimes help eliminate kidney stones and pain that they cause. A form of lithotripsy, also known as extracorporeal shock wave lithotripsy, is a nonsurgical procedure that helps your body rid itself of the kidney stone when it is too big to pass on its own. Extracorporeal shock wave lithotripsy is a method of crushing a kidney stone with shock waves. These shock waves pass through your body and are focused on your stone. They cause the kidney stones to crumble while still in the urinary tract. It is then easier for the smaller pieces of stone to pass in the urine. Lithotripsy usually takes about an hour. It is done in a hospital, a lithotripsy center, or a mobile unit. It usually does not require an overnight stay. Your health care provider will instruct you on preparation for the procedure. Your health care provider will tell you what to expect afterward. LET [facility] CARE PROVIDER KNOW ABOUT:  Any allergies you have.  All medicines you are taking, including vitamins, herbs, eye drops, creams, and over-the-counter medicines.  Previous problems you or members of your family have had with the use of anesthetics.  Any blood disorders you have.  Previous surgeries you have had.  Medical conditions you have. RISKS AND COMPLICATIONS Generally, lithotripsy for kidney stones is a safe procedure. However, as with any procedure, complications can occur. Possible complications include:  Infection.  Bleeding of the kidney.  Bruising of the kidney or skin.  Obstruction of the ureter.  Failure of the stone to fragment. BEFORE THE PROCEDURE  Do not eat or drink for 6-8 hours prior to the procedure. You may, however, take the medications with a sip of water that your physician instructs you to take  Do not take aspirin or aspirin-containing products for 7 days prior to your procedure  Do not take nonsteroidal anti-inflammatory products for 7 days  prior to your procedure PROCEDURE A stent (flexible tube with holes) may be placed in your ureter. The ureter is the tube that transports the urine from the kidneys to the bladder. Your health care provider may place a stent before the procedure. This will help keep urine flowing from the kidney if the fragments of the stone block the ureter. You may have an IV tube placed in one of your veins to give you fluids and medicines. These medicines may help you relax or make you sleep. During the procedure, you will lie comfortably on a fluid-filled cushion or in a warm-water bath. After an X-ray or ultrasound exam to locate your stone, shock waves are aimed at the stone. If you are awake, you may feel a tapping sensation as the shock waves pass through your body. If large stone particles remain after treatment, a second procedure may be necessary at a later date. For comfort during the test:  Relax as much as possible.  Try to remain still as much as possible.  Try to follow instructions to speed up the test.  Let your health care provider know if you are uncomfortable, anxious, or in pain. AFTER THE PROCEDURE  After surgery, you will be taken to the recovery area. A nurse will watch and check your progress. Once you're awake, stable, and taking fluids well, you will be allowed to go home as long as there are no problems. You will also be allowed to pass your urine before discharge.You may be given antibiotics to help prevent infection. You may also be prescribed pain medicine if  needed. In a week or two, your health care provider may remove your stent, if you have one. You may first have an X-ray exam to check on how successful the fragmentation of your stone has been and how much of the stone has passed. Your health care provider will check to see whether or not stone particles remain. SEEK IMMEDIATE MEDICAL CARE IF:  You develop a fever or shaking chills.  Your pain is not relieved by  medicine.  You feel sick to your stomach (nauseated) and you vomit.  You develop heavy bleeding.  You have difficulty urinating.  You start to pass your stent from your penis.   This information is not intended to replace advice given to you by your health care provider. Make sure you discuss any questions you have with your health care provider.   Document Released: 03/25/2000 Document Revised: 04/18/2014 Document Reviewed: 10/11/2012 Elsevier Interactive Patient Education 2016 [place]. Dietary Guidelines to Help Prevent Kidney Stones Your risk of kidney stones can be decreased by adjusting the foods you eat. The most important thing you can do is drink enough fluid. You should drink enough fluid to keep your urine clear or pale yellow. The following guidelines provide specific information for the type of kidney stone you have had. GUIDELINES ACCORDING TO TYPE OF KIDNEY STONE Calcium Oxalate Kidney Stones  Reduce the amount of salt you eat. Foods that have a lot of salt cause your body to release excess calcium into your urine. The excess calcium can combine with a substance called oxalate to form kidney stones.  Reduce the amount of animal protein you eat if the amount you eat is excessive. Animal protein causes your body to release excess calcium into your urine. Ask your dietitian how much protein from animal sources you should be eating.  Avoid foods that are high in oxalates. If you take vitamins, they should have less than 500 mg of vitamin C. Your body turns vitamin C into oxalates. You do not need to avoid fruits and vegetables high in vitamin C. Calcium Phosphate Kidney Stones  Reduce the amount of salt you eat to help prevent the release of excess calcium into your urine.  Reduce the amount of animal protein you eat if the amount you eat is excessive. Animal protein causes your body to release excess calcium into your urine. Ask your dietitian how much protein from animal  sources you should be eating.  Get enough calcium from food or take a calcium supplement (ask your dietitian for recommendations). Food sources of calcium that do not increase your risk of kidney stones include:  Broccoli.  Dairy products, such as cheese and yogurt.  Pudding. Uric Acid Kidney Stones  Do not have more than 6 oz of animal protein per day. FOOD SOURCES Animal Protein Sources  Meat (all types).  Poultry.  Eggs.  Fish, seafood. Foods High in [employer] seasonings.  Soy sauce.  Teriyaki sauce.  Cured and processed meats.  Salted crackers and snack foods.  Fast food.  Canned soups and most canned foods. Foods High in Oxalates  Grains:  Amaranth.  Barley.  Grits.  Wheat germ.  Bran.  Buckwheat flour.  All bran cereals.  Pretzels.  Whole wheat bread.  Vegetables:  Beans (wax).  Beets and beet greens.  Collard greens.  Eggplant.  [place].  Leeks.  Okra.  Parsley.  Rutabagas.  Spinach.  Swiss chard.  Tomato paste.  Fried potatoes.  Sweet potatoes.  Fruits:  Red currants.  Figs.  Kiwi.  Rhubarb.  Meat and Other Protein Sources:  Beans (dried).  Soy burgers and other soybean products.  Miso.  Nuts (peanuts, almonds, pecans, cashews, hazelnuts).  Nut butters.  Sesame seeds and tahini (paste made of sesame seeds).  Poppy seeds.  Beverages:  Chocolate drink mixes.  Soy milk.  Instant iced tea.  Juices made from high-oxalate fruits or vegetables.  Other:  Carob.  Chocolate.  Fruitcake.  Marmalades.   This information is not intended to replace advice given to you by your health care provider. Make sure you discuss any questions you have with your health care provider.   Document Released: 07/23/2010 Document Revised: 04/02/2013 Document Reviewed: 02/22/2013 Elsevier Interactive Patient Education [employer].

## 2015-02-22 LAB — CULTURE, URINE COMPREHENSIVE

## 2015-02-23 ENCOUNTER — Telehealth: Payer: Self-pay | Admitting: Obstetrics and Gynecology

## 2015-02-23 MED ORDER — CIPROFLOXACIN HCL 500 MG PO TABS: 500.0000 mg | ORAL_TABLET | Freq: Two times a day (BID) | ORAL | Status: DC

## 2015-02-23 NOTE — Telephone Encounter (Signed)
Please notify patient that her urine culture did grow out some bacteria. It is most likely vaginal contamination but since she is going to have a procedure 3 days from now would like to start her on antibiotic. I sent in prescription for Cipro for her to take twice a day 7 days.  thanks

## 2015-02-25 ENCOUNTER — Encounter: Payer: Self-pay | Admitting: *Deleted

## 2015-02-26 ENCOUNTER — Other Ambulatory Visit: Payer: Self-pay | Admitting: Urology

## 2015-02-26 ENCOUNTER — Encounter
Admission: RE | Disposition: A | Discharge status: Home / Self Care | Payer: Self-pay | Source: Ambulatory Visit | Attending: Urology

## 2015-02-26 ENCOUNTER — Ambulatory Visit
Admission: RE | Admit: 2015-02-26 | Discharge: 2015-02-26 | Disposition: A | Discharge status: Home / Self Care | Payer: 59 | Source: Ambulatory Visit | Attending: Urology | Admitting: Urology

## 2015-02-26 ENCOUNTER — Ambulatory Visit: Payer: 59

## 2015-02-26 ENCOUNTER — Encounter: Payer: Self-pay | Admitting: *Deleted

## 2015-02-26 DIAGNOSIS — Z880 Allergy status to penicillin: Secondary | ICD-10-CM | POA: Insufficient documentation

## 2015-02-26 DIAGNOSIS — Z885 Allergy status to narcotic agent status: Secondary | ICD-10-CM | POA: Diagnosis not present

## 2015-02-26 DIAGNOSIS — R42 Dizziness and giddiness: Secondary | ICD-10-CM | POA: Diagnosis not present

## 2015-02-26 DIAGNOSIS — N201 Calculus of ureter: Secondary | ICD-10-CM | POA: Diagnosis not present

## 2015-02-26 DIAGNOSIS — N200 Calculus of kidney: Secondary | ICD-10-CM

## 2015-02-26 DIAGNOSIS — Z85038 Personal history of other malignant neoplasm of large intestine: Secondary | ICD-10-CM | POA: Diagnosis not present

## 2015-02-26 DIAGNOSIS — Z79899 Other long term (current) drug therapy: Secondary | ICD-10-CM | POA: Insufficient documentation

## 2015-02-26 DIAGNOSIS — I951 Orthostatic hypotension: Secondary | ICD-10-CM | POA: Diagnosis not present

## 2015-02-26 DIAGNOSIS — K5090 Crohn's disease, unspecified, without complications: Secondary | ICD-10-CM | POA: Insufficient documentation

## 2015-02-26 DIAGNOSIS — F419 Anxiety disorder, unspecified: Secondary | ICD-10-CM | POA: Insufficient documentation

## 2015-02-26 DIAGNOSIS — Z888 Allergy status to other drugs, medicaments and biological substances status: Secondary | ICD-10-CM | POA: Insufficient documentation

## 2015-02-26 HISTORY — PX: EXTRACORPOREAL SHOCK WAVE LITHOTRIPSY: SHX1557

## 2015-02-26 SURGERY — LITHOTRIPSY, ESWL
Anesthesia: Moderate Sedation | Laterality: Right

## 2015-02-26 MED ORDER — DIAZEPAM 5 MG PO TABS
ORAL_TABLET | ORAL | Status: AC
  Filled 2015-02-26: qty 2

## 2015-02-26 MED ORDER — CIPROFLOXACIN HCL 500 MG PO TABS
ORAL_TABLET | ORAL | Status: AC
  Filled 2015-02-26: qty 1

## 2015-02-26 MED ORDER — DIAZEPAM 5 MG PO TABS
10.0000 mg | ORAL_TABLET | ORAL | Status: AC
  Administered 2015-02-26: 10 mg via ORAL

## 2015-02-26 MED ORDER — CIPROFLOXACIN HCL 500 MG PO TABS
500.0000 mg | ORAL_TABLET | ORAL | Status: AC
  Administered 2015-02-26: 500 mg via ORAL

## 2015-02-26 MED ORDER — DEXTROSE-NACL 5-0.45 % IV SOLN: INTRAVENOUS | Status: DC

## 2015-02-26 NOTE — Discharge Instructions (Addendum)
Follow Dr. Guinevere Ferrari postop instruction sheet as reviewed

## 2015-02-27 ENCOUNTER — Encounter: Payer: Self-pay | Admitting: Urology

## 2015-03-12 ENCOUNTER — Ambulatory Visit (INDEPENDENT_AMBULATORY_CARE_PROVIDER_SITE_OTHER): Payer: 59 | Admitting: Obstetrics and Gynecology

## 2015-03-12 ENCOUNTER — Ambulatory Visit
Admission: RE | Admit: 2015-03-12 | Discharge: 2015-03-12 | Disposition: A | Discharge status: Home / Self Care | Payer: 59 | Source: Ambulatory Visit | Attending: Urology | Admitting: Urology

## 2015-03-12 ENCOUNTER — Encounter: Payer: Self-pay | Admitting: Obstetrics and Gynecology

## 2015-03-12 VITALS — BP 133/84 | HR 84 | Resp 16 | Ht 69.0 in | Wt 176.9 lb

## 2015-03-12 DIAGNOSIS — Z09 Encounter for follow-up examination after completed treatment for conditions other than malignant neoplasm: Secondary | ICD-10-CM | POA: Diagnosis not present

## 2015-03-12 DIAGNOSIS — N200 Calculus of kidney: Secondary | ICD-10-CM | POA: Diagnosis not present

## 2015-03-12 DIAGNOSIS — R109 Unspecified abdominal pain: Secondary | ICD-10-CM | POA: Diagnosis not present

## 2015-03-12 NOTE — Progress Notes (Signed)
03/12/2015 4:27 PM   Ruth Morales [birth year] [id number]  Referring provider: Christie Nottingham, [place], [place] [address]  Chief Complaint  Patient presents with  . Results    KUB  . Nephrolithiasis    HPI: Patient is a 54 year old female who recently underwent ESWL on 02/26/15 for management of 5 x 4 mm distal RIGHT ureteral calculus several cm above the ureterovesical junction.  She has passed some stone fragments which she brought with her today.  Pain has completley resolved. She denies fevers or gross hematuria.    PMH: Past Medical History  Diagnosis Date  . Crohn disease ([place])   . Hypotension, postural 11/22/2013  . Extensor tenosynovitis of wrist 12/24/2013  . Ganglion cyst of wrist 12/24/2013  . Anxiety 11/21/2013  . Breath shortness 11/21/2013  . Dizziness 11/21/2013    Surgical History: Past Surgical History  Procedure Laterality Date  . Breast surgery Bilateral 2004    breast reduction  . Colon surgery  2004    colon removed  . Extracorporeal shock wave lithotripsy Right 02/26/2015    Procedure: EXTRACORPOREAL SHOCK WAVE LITHOTRIPSY (ESWL);  Surgeon: Collier Flowers, MD;  Location: [facility] ORS;  Service: Urology;  Laterality: Right;    Home Medications:    Medication List       This list is accurate as of: 03/12/15  4:27 PM.  Always use your most recent med list.               cyanocobalamin 1000 MCG/ML injection  Commonly known as:  (VITAMIN B-12)  Inject into the muscle every 30 (thirty) days.     loperamide 2 MG capsule  Commonly known as:  IMODIUM  Take 2 mg by mouth as needed for diarrhea or loose stools.     tamsulosin 0.4 MG Caps capsule  Commonly known as:  FLOMAX  Take 1 capsule (0.4 mg total) by mouth daily.     traMADol 50 MG tablet  Commonly known as:  ULTRAM  Take 1 tablet (50 mg total) by mouth every 8 (eight) hours as needed (Do not drive or operate machinery while taking as can cause drowsiness.).     Vitamin D  (Ergocalciferol) 50000 UNITS Caps capsule  Commonly known as:  DRISDOL  Take 5,000 Units by mouth daily.        Allergies:  Allergies  Allergen Reactions  . Morphine     Other reaction(s): Other (See Comments) Hallucination.  Marland Kitchen Penicillins Anaphylaxis    Headaches per patient  . Benadryl [Diphenhydramine Hcl] Anxiety    Per patient "craziness"    Family History: Family History  Problem Relation Age of Onset  . Diabetes Father   . Diabetes Mother   . Heart disease Maternal Grandmother     Social History:  reports that she has quit smoking. She has never used smokeless tobacco. She reports that she does not drink alcohol. Her drug history is not on file.  ROS: UROLOGY Frequent Urination?: No Hard to postpone urination?: No Burning/pain with urination?: No Get up at night to urinate?: No Leakage of urine?: No Urine stream starts and stops?: No Trouble starting stream?: No Do you have to strain to urinate?: No Blood in urine?: No Urinary tract infection?: No Sexually transmitted disease?: No Injury to kidneys or bladder?: No Painful intercourse?: No Weak stream?: No Currently pregnant?: No Vaginal bleeding?: No Last menstrual period?: n  Gastrointestinal Nausea?: No Vomiting?: No Indigestion/heartburn?: No Diarrhea?: No Constipation?: No  Constitutional  Fever: No Night sweats?: No Weight loss?: No Fatigue?: No  Skin Skin rash/lesions?: No Itching?: No  Eyes Blurred vision?: No Double vision?: No  Ears/Nose/Throat Sore throat?: No Sinus problems?: No  Hematologic/Lymphatic Swollen glands?: No Easy bruising?: No  Cardiovascular Leg swelling?: No Chest pain?: No  Respiratory Cough?: No Shortness of breath?: No  Endocrine Excessive thirst?: No  Musculoskeletal Back pain?: No Joint pain?: No  Neurological Headaches?: No Dizziness?: No  Psychologic Depression?: No Anxiety?: No  Physical Exam: BP 133/84 mmHg  Pulse 84  Resp  16  Ht 5\' 9"  (1.753 m)  Wt 176 lb 14.4 oz (80.241 kg)  BMI 26.11 kg/m2  Constitutional:  Alert and oriented, No acute distress. HEENT: [place] AT, moist mucus membranes.  Trachea midline, no masses. Cardiovascular: No clubbing, cyanosis, or edema. Respiratory: Normal respiratory effort, no increased work of breathing. Skin: No rashes, bruises or suspicious lesions. Neurologic: Grossly intact, no focal deficits, moving all 4 extremities. Psychiatric: Normal mood and affect.  Laboratory Data:   Urinalysis  Pertinent Imaging:   Assessment & Plan:    1.  Kidney stones- Status post ESWL. Patient reports feeling well today. Stone fragments sent for analysis.  Follow-up in 4 weeks to review renal ultrasound results as well as stone analysis report.  2. Flank pain- Resolved.  RUS scheduled in 4 weeks.   There are no diagnoses linked to this encounter.  Return for RUS in 4 weeks results after.  These notes generated with voice recognition software. I apologize for typographical errors.  Herbert Moors, [facility] [address] [place], [place] [address] [phone]

## 2015-03-23 ENCOUNTER — Ambulatory Visit
Admission: RE | Admit: 2015-03-23 | Discharge: 2015-03-23 | Disposition: A | Discharge status: Home / Self Care | Payer: 59 | Source: Ambulatory Visit | Attending: Obstetrics and Gynecology | Admitting: Obstetrics and Gynecology

## 2015-03-23 DIAGNOSIS — R109 Unspecified abdominal pain: Secondary | ICD-10-CM | POA: Diagnosis present

## 2015-03-23 DIAGNOSIS — N200 Calculus of kidney: Secondary | ICD-10-CM

## 2015-03-24 ENCOUNTER — Encounter: Payer: Self-pay | Admitting: Obstetrics and Gynecology

## 2015-03-27 ENCOUNTER — Ambulatory Visit: Payer: 59

## 2015-04-09 ENCOUNTER — Ambulatory Visit (INDEPENDENT_AMBULATORY_CARE_PROVIDER_SITE_OTHER): Payer: 59 | Admitting: Obstetrics and Gynecology

## 2015-04-09 ENCOUNTER — Encounter: Payer: Self-pay | Admitting: Obstetrics and Gynecology

## 2015-04-09 VITALS — BP 118/80 | HR 77 | Resp 16 | Ht 69.0 in | Wt 176.7 lb

## 2015-04-09 DIAGNOSIS — N200 Calculus of kidney: Secondary | ICD-10-CM | POA: Diagnosis not present

## 2015-04-09 NOTE — Patient Instructions (Signed)
Dietary Guidelines to Help Prevent Kidney Stones Your risk of kidney stones can be decreased by adjusting the foods you eat. The most important thing you can do is drink enough fluid. You should drink enough fluid to keep your urine clear or pale yellow. The following guidelines provide specific information for the type of kidney stone you have had. GUIDELINES ACCORDING TO TYPE OF KIDNEY STONE Calcium Oxalate Kidney Stones  Reduce the amount of salt you eat. Foods that have a lot of salt cause your body to release excess calcium into your urine. The excess calcium can combine with a substance called oxalate to form kidney stones.  Reduce the amount of animal protein you eat if the amount you eat is excessive. Animal protein causes your body to release excess calcium into your urine. Ask your dietitian how much protein from animal sources you should be eating.  Avoid foods that are high in oxalates. If you take vitamins, they should have less than 500 mg of vitamin C. Your body turns vitamin C into oxalates. You do not need to avoid fruits and vegetables high in vitamin C. Calcium Phosphate Kidney Stones  Reduce the amount of salt you eat to help prevent the release of excess calcium into your urine.  Reduce the amount of animal protein you eat if the amount you eat is excessive. Animal protein causes your body to release excess calcium into your urine. Ask your dietitian how much protein from animal sources you should be eating.  Get enough calcium from food or take a calcium supplement (ask your dietitian for recommendations). Food sources of calcium that do not increase your risk of kidney stones include:  Broccoli.  Dairy products, such as cheese and yogurt.  Pudding. Uric Acid Kidney Stones  Do not have more than 6 oz of animal protein per day. FOOD SOURCES Animal Protein Sources  Meat (all types).  Poultry.  Eggs.  Fish, seafood. Foods High in Salt  Salt seasonings.  Soy  sauce.  Teriyaki sauce.  Cured and processed meats.  Salted crackers and snack foods.  Fast food.  Canned soups and most canned foods. Foods High in Oxalates  Grains:  Amaranth.  Barley.  Grits.  Wheat germ.  Bran.  Buckwheat flour.  All bran cereals.  Pretzels.  Whole wheat bread.  Vegetables:  Beans (wax).  Beets and beet greens.  Collard greens.  Eggplant.  [place]ole.  Leeks.  Okra.  Parsley.  Rutabagas.  Spinach.  Swiss chard.  Tomato paste.  Fried potatoes.  Sweet potatoes.  Fruits:  Red currants.  Figs.  Kiwi.  Rhubarb.  Meat and Other Protein Sources:  Beans (dried).  Soy burgers and other soybean products.  Miso.  Nuts (peanuts, almonds, pecans, cashews, hazelnuts).  Nut butters.  Sesame seeds and tahini (paste made of sesame seeds).  Poppy seeds.  Beverages:  Chocolate drink mixes.  Soy milk.  Instant iced tea.  Juices made from high-oxalate fruits or vegetables.  Other:  Carob.  Chocolate.  Fruitcake.  Marmalades.   This information is not intended to replace advice given to you by your health care provider. Make sure you discuss any questions you have with your health care provider.   Document Released: 07/23/2010 Document Revised: 04/02/2013 Document Reviewed: 02/22/2013 Elsevier Interactive Patient Education 2016 Elsevier Inc.  

## 2015-04-09 NOTE — Progress Notes (Signed)
11:08 AM   Ruth Morales [birth year] [id number]  Referring provider: Christie Nottingham, [place], [place] [address]  Chief Complaint  Patient presents with  . Results    RUS    HPI: Patient is a 54 year old female who recently underwent ESWL on 02/26/15 for management of 5 x 4 mm distal RIGHT ureteral calculus several cm above the ureterovesical junction.  She has passed some stone fragments which she brought with her today.  Pain has completley resolved. She denies fevers or gross hematuria.    Current Status: Presents today to review renal ultrasound results. She reports no further flank pain. No gross hematuria or any other urinary symptoms. No fevers.  PMH: Past Medical History  Diagnosis Date  . Crohn disease ([place])   . Hypotension, postural 11/22/2013  . Extensor tenosynovitis of wrist 12/24/2013  . Ganglion cyst of wrist 12/24/2013  . Anxiety 11/21/2013  . Breath shortness 11/21/2013  . Dizziness 11/21/2013    Surgical History: Past Surgical History  Procedure Laterality Date  . Breast surgery Bilateral 2004    breast reduction  . Colon surgery  2004    colon removed  . Extracorporeal shock wave lithotripsy Right 02/26/2015    Procedure: EXTRACORPOREAL SHOCK WAVE LITHOTRIPSY (ESWL);  Surgeon: Collier Flowers, MD;  Location: [facility] ORS;  Service: Urology;  Laterality: Right;    Home Medications:    Medication List       This list is accurate as of: 04/09/15 11:08 AM.  Always use your most recent med list.               cyanocobalamin 1000 MCG/ML injection  Commonly known as:  (VITAMIN B-12)  Inject into the muscle every 30 (thirty) days.     loperamide 2 MG capsule  Commonly known as:  IMODIUM  Take 2 mg by mouth as needed for diarrhea or loose stools.     Vitamin D (Ergocalciferol) 50000 units Caps capsule  Commonly known as:  DRISDOL  Take 5,000 Units by mouth daily.        Allergies:  Allergies  Allergen Reactions  . Morphine     Other  reaction(s): Other (See Comments) Hallucination.  Marland Kitchen Penicillins Anaphylaxis    Headaches per patient  . Benadryl [Diphenhydramine Hcl] Anxiety    Per patient "craziness"    Family History: Family History  Problem Relation Age of Onset  . Diabetes Father   . Diabetes Mother   . Heart disease Maternal Grandmother     Social History:  reports that she has quit smoking. She has never used smokeless tobacco. She reports that she does not drink alcohol. Her drug history is not on file.  ROS: UROLOGY Frequent Urination?: No Hard to postpone urination?: No Burning/pain with urination?: No Get up at night to urinate?: No Leakage of urine?: No Urine stream starts and stops?: No Trouble starting stream?: No Do you have to strain to urinate?: No Blood in urine?: No Urinary tract infection?: No Sexually transmitted disease?: No Injury to kidneys or bladder?: No Painful intercourse?: No Weak stream?: No Currently pregnant?: No Vaginal bleeding?: No Last menstrual period?: n  Gastrointestinal Nausea?: No Vomiting?: No Indigestion/heartburn?: No Diarrhea?: No Constipation?: No  Constitutional Fever: No Night sweats?: No Weight loss?: No Fatigue?: No  Skin Skin rash/lesions?: No Itching?: No  Eyes Blurred vision?: No Double vision?: No  Ears/Nose/Throat Sore throat?: No Sinus problems?: No  Hematologic/Lymphatic Swollen glands?: No Easy bruising?: No  Cardiovascular Leg swelling?:  No Chest pain?: No  Respiratory Cough?: No Shortness of breath?: No  Endocrine Excessive thirst?: No  Musculoskeletal Back pain?: No Joint pain?: No  Neurological Headaches?: No Dizziness?: No  Psychologic Depression?: No Anxiety?: No  Physical Exam: BP 118/80 mmHg  Pulse 77  Resp 16  Ht 5\' 9"  (1.753 m)  Wt 176 lb 11.2 oz (80.151 kg)  BMI 26.08 kg/m2  Constitutional:  Alert and oriented, No acute distress. HEENT: [place] AT, moist mucus membranes.  Trachea midline,  no masses. Cardiovascular: No clubbing, cyanosis, or edema. Respiratory: Normal respiratory effort, no increased work of breathing. Skin: No rashes, bruises or suspicious lesions. Neurologic: Grossly intact, no focal deficits, moving all 4 extremities. Psychiatric: Normal mood and affect.  Laboratory Data:   Urinalysis CLINICAL DATA: Flank pain. Personal history of nephrolithiasis.  EXAM: RENAL / URINARY TRACT ULTRASOUND COMPLETE  COMPARISON: CT of the abdomen and pelvis 02/17/2015  FINDINGS: Right Kidney:  Length: 10.5 cm, within normal limits. Echogenicity within normal limits. No mass or hydronephrosis visualized.  Left Kidney:  Length: 11.1 cm, within normal limits. Echogenicity within normal limits. No mass or hydronephrosis visualized.  Bladder:  Appears normal for degree of bladder distention.  IMPRESSION: Negative bilateral renal ultrasound. Electronically Signed  By: San Morelle M.D.  Pertinent Imaging:   Assessment & Plan:    1.  Kidney stones- Status post ESWL. Patient reports feeling well today. Stone fragments sent for analysis.  Follow-up renal ultrasound negative for hydronephrosis or other GU abnormalities. We discussed general stone prevention techniques including drinking plenty water with goal of producing 2.5 L urine daily, increased citric acid intake, avoidance of high oxalate containing foods, and decreased salt intake.  Information about dietary recommendations given today.   2. Flank pain- Resolved.  RUS negative.    There are no diagnoses linked to this encounter.  Return if symptoms worsen or fail to improve.  These notes generated with voice recognition software. I apologize for typographical errors.  Herbert Moors, [facility] [address] [place], [place] [address] [phone]

## 2015-08-11 ENCOUNTER — Ambulatory Visit
Admission: EM | Admit: 2015-08-11 | Discharge: 2015-08-11 | Disposition: A | Discharge status: Home / Self Care | Payer: 59 | Attending: Family Medicine | Admitting: Family Medicine

## 2015-08-11 DIAGNOSIS — J040 Acute laryngitis: Secondary | ICD-10-CM

## 2015-08-11 DIAGNOSIS — J029 Acute pharyngitis, unspecified: Secondary | ICD-10-CM | POA: Diagnosis not present

## 2015-08-11 HISTORY — DX: Unspecified malignant neoplasm of skin, unspecified: C44.90

## 2015-08-11 LAB — RAPID STREP SCREEN (MED CTR MEBANE ONLY): Streptococcus, Group A Screen (Direct): NEGATIVE

## 2015-08-11 MED ORDER — LIDOCAINE VISCOUS 2 % MT SOLN: 15.0000 mL | Freq: Three times a day (TID) | OROMUCOSAL | Status: DC | PRN

## 2015-08-11 NOTE — ED Provider Notes (Signed)
[facility]  ____________________________________________  Time seen: Approximately 9:40 AM  I have reviewed the triage vital signs and the nursing notes.   HISTORY  Chief Complaint Sore Throat  HPI Ruth Morales is a 55 y.o. female presents for the complaint of worsening voice and sore throat for the last 2 days. Patient states that when she woke up Sunday morning with a mild scratchy throat that then progressed to a hoarse voice as Sunday went on. Patient states that she had more of a sore throat this morning upon awakening. Patient states that she is frequently around her grandchildren who have also had runny nose and sore throats, and patient states she will make sure she did not strep throat. Patient reports that she does not normally have seasonal allergies but states the symptoms occurred after her having worked outside on Saturday.  Reports continues to eat and drink well. Denies fevers. Denies cough, nasal congestion, chest pain, shortness of breath, abdominal pain, dysuria, neck pain, back pain or other complaints.  PCP: Radford Pax  No LMP recorded. Patient is postmenopausal.   Past Medical History  Diagnosis Date  . Crohn disease ([place])   . Hypotension, postural 11/22/2013  . Extensor tenosynovitis of wrist 12/24/2013  . Ganglion cyst of wrist 12/24/2013  . Anxiety 11/21/2013  . Breath shortness 11/21/2013  . Dizziness 11/21/2013  . Skin cancer     Patient Active Problem List   Diagnosis Date Noted  . Extensor tenosynovitis of wrist 12/24/2013  . Ganglion cyst of wrist 12/24/2013  . Hypotension, postural 11/22/2013  . Anxiety 11/21/2013  . Dizziness 11/21/2013  . Breath shortness 11/21/2013    Past Surgical History  Procedure Laterality Date  . Breast surgery Bilateral 2004    breast reduction  . Colon surgery  2004    colon removed  . Extracorporeal shock wave lithotripsy Right 02/26/2015    Procedure: EXTRACORPOREAL SHOCK WAVE LITHOTRIPSY (ESWL);  Surgeon:  Collier Flowers, MD;  Location: [facility] ORS;  Service: Urology;  Laterality: Right;    Current Outpatient Rx  Name  Route  Sig  Dispense  Refill  . loperamide (IMODIUM) 2 MG capsule   Oral   Take 2 mg by mouth as needed for diarrhea or loose stools.         . cyanocobalamin (,VITAMIN B-12,) 1000 MCG/ML injection   Intramuscular   Inject into the muscle every 30 (thirty) days.          .           . Vitamin D, Ergocalciferol, (DRISDOL) 50000 UNITS CAPS capsule   Oral   Take 5,000 Units by mouth daily.           Allergies Morphine; Penicillins; and Benadryl  Family History  Problem Relation Age of Onset  . Diabetes Father   . Diabetes Mother   . Heart disease Maternal Grandmother     Social History Social History  Substance Use Topics  . Smoking status: Former Research scientist (life sciences)  . Smokeless tobacco: Never Used  . Alcohol Use: No     Comment: occasional drink, once a month    Review of Systems Constitutional: No fever/chills Eyes: No visual changes. ENT: As above. Cardiovascular: Denies chest pain. Respiratory: Denies shortness of breath. Gastrointestinal: No abdominal pain.  No nausea, no vomiting.  No diarrhea.  No constipation. Genitourinary: Negative for dysuria. Musculoskeletal: Negative for back pain. Skin: Negative for rash. Neurological: Negative for headaches, focal weakness or numbness.  10-point ROS otherwise negative.  ____________________________________________   PHYSICAL EXAM:  VITAL SIGNS: ED Triage Vitals  Enc Vitals Group     BP 08/11/15 0820 114/64 mmHg     Pulse Rate 08/11/15 0820 64     Resp 08/11/15 0820 18     Temp 08/11/15 0820 97.8 F (36.6 C)     Temp Source 08/11/15 0820 Oral     SpO2 08/11/15 0820 100 %     Weight 08/11/15 0820 178 lb (80.74 kg)     Height 08/11/15 0820 5' 8.5" (1.74 m)     Head Cir --      Peak Flow --      Pain Score 08/11/15 0824 1     Pain Loc --      Pain Edu? --      Excl. in [place]? --      Constitutional: Alert and oriented. Well appearing and in no acute distress. Eyes: Conjunctivae are normal. PERRL. EOMI. Head: Atraumatic.No sinus tenderness to palpation. No swelling. No erythema.  Ears: no erythema, normal TMs bilaterally.   Nose: No congestion/rhinnorhea.  Mouth/Throat: Mucous membranes are moist.  Oropharynx non-erythematous. No tonsillar swelling or exudate. Neck: No stridor.  No cervical spine tenderness to palpation. Hematological/Lymphatic/Immunilogical: No cervical lymphadenopathy. Cardiovascular: Normal rate, regular rhythm. Grossly normal heart sounds.  Good peripheral circulation. Respiratory: Normal respiratory effort.  No retractions. Lungs CTAB. Gastrointestinal: Soft and nontender.  Musculoskeletal: No lower or upper extremity tenderness nor edema.   Neurologic:  Normal speech and language. No gross focal neurologic deficits are appreciated. No gait instability. Skin:  Skin is warm, dry and intact. No rash noted. Psychiatric: Mood and affect are normal. Speech and behavior are normal.  ____________________________________________   LABS (all labs ordered are listed, but only abnormal results are displayed)  Labs Reviewed  RAPID STREP SCREEN (NOT AT [facility])  CULTURE, GROUP A STREP [facility])     INITIAL IMPRESSION / ASSESSMENT AND PLAN / ED COURSE  Pertinent labs & imaging results that were available during my care of the patient were reviewed by me and considered in my medical decision making (see chart for details).  Very well-appearing patient. No acute distress. Presents for complaints of 2 days of hoarse voice and mild sore throat. Denies fevers or other complaints symptoms. Recently around grandchildren with similar symptoms. Reports continues to eat and drink well. Quick strep negative, will culture. Suspect viral laryngitis and pharyngitis. Encouraged rest, fluids, when necessary Viscous Lidocaine gargles, lozenges. Work note for today  given.  Discussed follow up with Primary care physician this week. Discussed follow up and return parameters including no resolution or any worsening concerns. Patient verbalized understanding and agreed to plan.   ____________________________________________   FINAL CLINICAL IMPRESSION(S) / ED DIAGNOSES  Final diagnoses:  Laryngitis  Pharyngitis      Note: This dictation was prepared with Dragon dictation along with smaller phrase technology. Any transcriptional errors that result from this process are unintentional.    Marylene Land, NP 08/11/15 0945  Marylene Land, NP 08/11/15 0945

## 2015-08-11 NOTE — Discharge Instructions (Signed)
Take medication as prescribed. Rest. Drink plenty of fluids. Use lozenges as needed.   Follow up with your primary care physician this week as needed. Return to Urgent care for new or worsening concerns.    Laryngitis Laryngitis is inflammation of your vocal cords. This causes hoarseness, coughing, loss of voice, sore throat, or a dry throat. Your vocal cords are two bands of muscles that are found in your throat. When you speak, these cords come together and vibrate. These vibrations come out through your mouth as sound. When your vocal cords are inflamed, your voice sounds different. Laryngitis can be temporary (acute) or long-term (chronic). Most cases of acute laryngitis improve with time. Chronic laryngitis is laryngitis that lasts for more than three weeks. CAUSES Acute laryngitis may be caused by:  A viral infection.  Lots of talking, yelling, or singing. This is also called vocal strain.  Bacterial infections. Chronic laryngitis may be caused by:  Vocal strain.  Injury to your vocal cords.  Acid reflux (gastroesophageal reflux disease or GERD).  Allergies.  Sinus infection.  Smoking.  Alcohol abuse.  Breathing in chemicals or dust.  Growths on the vocal cords. RISK FACTORS Risk factors for laryngitis include:  Smoking.  Alcohol abuse.  Having allergies. SIGNS AND SYMPTOMS Symptoms of laryngitis may include:  Low, hoarse voice.  Loss of voice.  Dry cough.  Sore throat.  Stuffy nose. DIAGNOSIS Laryngitis may be diagnosed by:  Physical exam.  Throat culture.  Blood test.  Laryngoscopy. This procedure allows your health care provider to look at your vocal cords with a mirror or viewing tube. TREATMENT Treatment for laryngitis depends on what is causing it. Usually, treatment involves resting your voice and using medicines to soothe your throat. However, if your laryngitis is caused by a bacterial infection, you may need to take antibiotic  medicine. If your laryngitis is caused by a growth, you may need to have a procedure to remove it. HOME CARE INSTRUCTIONS  Drink enough fluid to keep your urine clear or pale yellow.  Breathe in moist air. Use a humidifier if you live in a dry climate.  Take medicines only as directed by your health care provider.  If you were prescribed an antibiotic medicine, finish it all even if you start to feel better.  Do not smoke cigarettes or electronic cigarettes. If you need help quitting, ask your health care provider.  Talk as little as possible. Also avoid whispering, which can cause vocal strain.  Write instead of talking. Do this until your voice is back to normal. SEEK MEDICAL CARE IF:  You have a fever.  You have increasing pain.  You have difficulty swallowing. SEEK IMMEDIATE MEDICAL CARE IF:  You cough up blood.  You have trouble breathing.   This information is not intended to replace advice given to you by your health care provider. Make sure you discuss any questions you have with your health care provider.   Document Released: 03/28/2005 Document Revised: 04/18/2014 Document Reviewed: 09/10/2013 Elsevier Interactive Patient Education 2016 Elsevier Inc[employer]  Pharyngitis Pharyngitis is redness, pain, and swelling (inflammation) of your pharynx.  CAUSES  Pharyngitis is usually caused by infection. Most of the time, these infections are from viruses (viral) and are part of a cold. However, sometimes pharyngitis is caused by bacteria (bacterial). Pharyngitis can also be caused by allergies. Viral pharyngitis may be spread from person to person by coughing, sneezing, and personal items or utensils (cups, forks, spoons, toothbrushes). Bacterial pharyngitis may be  spread from person to person by more intimate contact, such as kissing.  SIGNS AND SYMPTOMS  Symptoms of pharyngitis include:   Sore throat.   Tiredness (fatigue).   Low-grade fever.   Headache.  Joint pain  and muscle aches.  Skin rashes.  Swollen lymph nodes.  Plaque-like film on throat or tonsils (often seen with bacterial pharyngitis). DIAGNOSIS  Your health care provider will ask you questions about your illness and your symptoms. Your medical history, along with a physical exam, is often all that is needed to diagnose pharyngitis. Sometimes, a rapid strep test is done. Other lab tests may also be done, depending on the suspected cause.  TREATMENT  Viral pharyngitis will usually get better in 3-4 days without the use of medicine. Bacterial pharyngitis is treated with medicines that kill germs (antibiotics).  HOME CARE INSTRUCTIONS   Drink enough water and fluids to keep your urine clear or pale yellow.   Only take over-the-counter or prescription medicines as directed by your health care provider:   If you are prescribed antibiotics, make sure you finish them even if you start to feel better.   Do not take aspirin.   Get lots of rest.   Gargle with 8 oz of salt water ( tsp of salt per 1 qt of water) as often as every 1-2 hours to soothe your throat.   Throat lozenges (if you are not at risk for choking) or sprays may be used to soothe your throat. SEEK MEDICAL CARE IF:   You have large, tender lumps in your neck.  You have a rash.  You cough up green, yellow-brown, or bloody spit. SEEK IMMEDIATE MEDICAL CARE IF:   Your neck becomes stiff.  You drool or are unable to swallow liquids.  You vomit or are unable to keep medicines or liquids down.  You have severe pain that does not go away with the use of recommended medicines.  You have trouble breathing (not caused by a stuffy nose). MAKE SURE YOU:   Understand these instructions.  Will watch your condition.  Will get help right away if you are not doing well or get worse.   This information is not intended to replace advice given to you by your health care provider. Make sure you discuss any questions you  have with your health care provider.   Document Released: 03/28/2005 Document Revised: 01/16/2013 Document Reviewed: 12/03/2012 Elsevier Interactive Patient Education [employer].

## 2015-08-13 LAB — CULTURE, GROUP A STREP (THRC)

## 2015-08-17 MED ORDER — AZITHROMYCIN 250 MG PO TABS: ORAL_TABLET | ORAL | Status: DC

## 2015-08-19 ENCOUNTER — Telehealth: Payer: Self-pay | Admitting: Emergency Medicine

## 2015-08-19 NOTE — ED Notes (Signed)
Patient was notified that her throat culture came back positive for Strep and that Marylene Land, NP sent in a prescription for a Z-Pack to her pharmacy.  Patient was instructed to take her antibiotic as directed and to follow-up here or with her PCP if her symptoms do not improve or worsen.  Patient verbalized understanding.

## 2015-11-13 ENCOUNTER — Other Ambulatory Visit: Payer: Self-pay | Admitting: Physician Assistant

## 2015-11-13 DIAGNOSIS — Z1231 Encounter for screening mammogram for malignant neoplasm of breast: Secondary | ICD-10-CM

## 2015-11-18 ENCOUNTER — Ambulatory Visit: Admission: RE | Admit: 2015-11-18 | Payer: 59 | Source: Ambulatory Visit

## 2015-11-23 ENCOUNTER — Ambulatory Visit
Admission: RE | Admit: 2015-11-23 | Discharge: 2015-11-23 | Disposition: A | Discharge status: Home / Self Care | Payer: 59 | Source: Ambulatory Visit | Attending: Physician Assistant | Admitting: Physician Assistant

## 2015-11-23 ENCOUNTER — Other Ambulatory Visit: Payer: Self-pay | Admitting: Physician Assistant

## 2015-11-23 DIAGNOSIS — Z1231 Encounter for screening mammogram for malignant neoplasm of breast: Secondary | ICD-10-CM

## 2016-03-12 ENCOUNTER — Encounter: Payer: Self-pay | Admitting: Emergency Medicine

## 2016-03-12 ENCOUNTER — Ambulatory Visit
Admission: EM | Admit: 2016-03-12 | Discharge: 2016-03-12 | Disposition: A | Discharge status: Home / Self Care | Payer: 59 | Attending: Family Medicine | Admitting: Family Medicine

## 2016-03-12 DIAGNOSIS — J0100 Acute maxillary sinusitis, unspecified: Secondary | ICD-10-CM

## 2016-03-12 MED ORDER — AZITHROMYCIN 250 MG PO TABS: ORAL_TABLET | ORAL | 0 refills | Status: DC

## 2016-03-12 NOTE — ED Provider Notes (Signed)
[facility]    CSN: [id number] Arrival date & time: 03/12/16  1349     History   Chief Complaint Chief Complaint  Patient presents with  . Facial Pain    HPI Ruth Morales is a 55 y.o. female.   The history is provided by the patient.  URI  Presenting symptoms: congestion, cough, facial pain and fatigue   Severity:  Moderate Onset quality:  Sudden Duration:  2 weeks Timing:  Constant Progression:  Worsening Chronicity:  New Relieved by:  Nothing Ineffective treatments:  OTC medications Associated symptoms: headaches and sinus pain   Associated symptoms: no wheezing   Risk factors: not elderly, no chronic cardiac disease, no chronic kidney disease, no chronic respiratory disease, no diabetes mellitus, no immunosuppression, no recent illness, no recent travel and no sick contacts     Past Medical History:  Diagnosis Date  . Anxiety 11/21/2013  . Breath shortness 11/21/2013  . Crohn disease ([place])   . Dizziness 11/21/2013  . Extensor tenosynovitis of wrist 12/24/2013  . Ganglion cyst of wrist 12/24/2013  . Hypotension, postural 11/22/2013  . Skin cancer     Patient Active Problem List   Diagnosis Date Noted  . Extensor tenosynovitis of wrist 12/24/2013  . Ganglion cyst of wrist 12/24/2013  . Hypotension, postural 11/22/2013  . Anxiety 11/21/2013  . Dizziness 11/21/2013  . Breath shortness 11/21/2013    Past Surgical History:  Procedure Laterality Date  . BREAST BIOPSY Left 2003   needle bx-neg  . BREAST SURGERY Bilateral 2004   breast reduction  . COLON SURGERY  2004   colon removed  . EXTRACORPOREAL SHOCK WAVE LITHOTRIPSY Right 02/26/2015   Procedure: EXTRACORPOREAL SHOCK WAVE LITHOTRIPSY (ESWL);  Surgeon: Collier Flowers, MD;  Location: [facility] ORS;  Service: Urology;  Laterality: Right;  . REDUCTION MAMMAPLASTY Bilateral 2005    OB History    No data available       Home Medications    Prior to Admission medications   Medication Sig Start  Date End Date Taking? Authorizing Provider  azithromycin (ZITHROMAX Z-PAK) 250 MG tablet 2 tabs po once day 1, then 1 tab po qd for next 4 days 03/12/16   Norval Gable, MD  cyanocobalamin (,VITAMIN B-12,) 1000 MCG/ML injection Inject into the muscle every 30 (thirty) days.  11/21/13   Historical Provider, MD  lidocaine (XYLOCAINE) 2 % solution Use as directed 15 mLs in the mouth or throat every 8 (eight) hours as needed (sore throat. gargle and spit as needed for sore throat.). 08/11/15   Marylene Land, NP  loperamide (IMODIUM) 2 MG capsule Take 2 mg by mouth as needed for diarrhea or loose stools.    Historical Provider, MD  Vitamin D, Ergocalciferol, (DRISDOL) 50000 UNITS CAPS capsule Take 5,000 Units by mouth daily.    Historical Provider, MD    Family History Family History  Problem Relation Age of Onset  . Diabetes Father   . Diabetes Mother   . Heart disease Maternal Grandmother   . Breast cancer Neg Hx     Social History Social History  Substance Use Topics  . Smoking status: Former Research scientist (life sciences)  . Smokeless tobacco: Never Used  . Alcohol use No     Comment: occasional drink, once a month     Allergies   Morphine; Penicillins; and Benadryl [diphenhydramine hcl]   Review of Systems Review of Systems  Constitutional: Positive for fatigue.  HENT: Positive for congestion and sinus pain.   Respiratory:  Positive for cough. Negative for wheezing.   Neurological: Positive for headaches.     Physical Exam Triage Vital Signs ED Triage Vitals  Enc Vitals Group     BP 03/12/16 1444 111/70     Pulse Rate 03/12/16 1444 72     Resp 03/12/16 1444 16     Temp 03/12/16 1444 98.3 F (36.8 C)     Temp Source 03/12/16 1444 Oral     SpO2 03/12/16 1444 100 %     Weight 03/12/16 1443 180 lb (81.6 kg)     Height 03/12/16 1443 5' 8.5" (1.74 m)     Head Circumference --      Peak Flow --      Pain Score 03/12/16 1445 4     Pain Loc --      Pain Edu? --      Excl. in [place]? --    No data  found.   Updated Vital Signs BP 111/70 (BP Location: Right Arm)   Pulse 72   Temp 98.3 F (36.8 C) (Oral)   Resp 16   Ht 5' 8.5" (1.74 m)   Wt 180 lb (81.6 kg)   SpO2 100%   BMI 26.97 kg/m   Visual Acuity Right Eye Distance:   Left Eye Distance:   Bilateral Distance:    Right Eye Near:   Left Eye Near:    Bilateral Near:     Physical Exam  Constitutional: She appears well-developed and well-nourished. No distress.  HENT:  Head: Normocephalic and atraumatic.  Right Ear: Tympanic membrane, external ear and ear canal normal.  Left Ear: Tympanic membrane, external ear and ear canal normal.  Nose: Mucosal edema and rhinorrhea present. No nose lacerations, sinus tenderness, nasal deformity, septal deviation or nasal septal hematoma. No epistaxis.  No foreign bodies. Right sinus exhibits maxillary sinus tenderness and frontal sinus tenderness. Left sinus exhibits maxillary sinus tenderness and frontal sinus tenderness.  Mouth/Throat: Uvula is midline, oropharynx is clear and moist and mucous membranes are normal. No oropharyngeal exudate.  Eyes: Conjunctivae and EOM are normal. Pupils are equal, round, and reactive to light. Right eye exhibits no discharge. Left eye exhibits no discharge. No scleral icterus.  Neck: Normal range of motion. Neck supple. No thyromegaly present.  Cardiovascular: Normal rate, regular rhythm and normal heart sounds.   Pulmonary/Chest: Effort normal and breath sounds normal. No respiratory distress. She has no wheezes. She has no rales.  Lymphadenopathy:    She has no cervical adenopathy.  Skin: She is not diaphoretic.  Nursing note and vitals reviewed.    UC Treatments / Results  Labs (all labs ordered are listed, but only abnormal results are displayed) Labs Reviewed - No data to display  EKG  EKG Interpretation None       Radiology No results found.  Procedures Procedures (including critical care time)  Medications Ordered in  UC Medications - No data to display   Initial Impression / Assessment and Plan / UC Course  I have reviewed the triage vital signs and the nursing notes.  Pertinent labs & imaging results that were available during my care of the patient were reviewed by me and considered in my medical decision making (see chart for details).  Clinical Course       Final Clinical Impressions(s) / UC Diagnoses   Final diagnoses:  Acute maxillary sinusitis, recurrence not specified    New Prescriptions Discharge Medication List as of 03/12/2016  2:57 PM    START  taking these medications   Details  azithromycin (ZITHROMAX Z-PAK) 250 MG tablet 2 tabs po once day 1, then 1 tab po qd for next 4 days, Normal       1.  diagnosis reviewed with patient 2. rx as per orders above; reviewed possible side effects, interactions, risks and benefits  3.Follow-up prn if symptoms worsen or don't improve   Norval Gable, MD 03/12/16 1521

## 2016-03-12 NOTE — ED Triage Notes (Signed)
Patient c/o sinus pain and congestion and cough for 2 weeks.  Patient denies fevers.

## 2016-03-15 ENCOUNTER — Telehealth: Payer: Self-pay

## 2016-03-15 NOTE — Telephone Encounter (Signed)
Courtesy call back completed today for patient's recent visit at [facility]. Patient did not answer, left message on machine to call back with any questions or concerns.   

## 2016-05-18 DIAGNOSIS — L570 Actinic keratosis: Secondary | ICD-10-CM | POA: Diagnosis not present

## 2016-05-26 DIAGNOSIS — S161XXA Strain of muscle, fascia and tendon at neck level, initial encounter: Secondary | ICD-10-CM | POA: Diagnosis not present

## 2016-06-07 DIAGNOSIS — D519 Vitamin B12 deficiency anemia, unspecified: Secondary | ICD-10-CM | POA: Diagnosis not present

## 2016-07-11 DIAGNOSIS — E041 Nontoxic single thyroid nodule: Secondary | ICD-10-CM | POA: Diagnosis not present

## 2016-07-11 DIAGNOSIS — Z780 Asymptomatic menopausal state: Secondary | ICD-10-CM | POA: Diagnosis not present

## 2016-08-25 DIAGNOSIS — N390 Urinary tract infection, site not specified: Secondary | ICD-10-CM | POA: Diagnosis not present

## 2016-09-29 DIAGNOSIS — N959 Unspecified menopausal and perimenopausal disorder: Secondary | ICD-10-CM | POA: Diagnosis not present

## 2016-11-14 ENCOUNTER — Other Ambulatory Visit: Payer: Self-pay | Admitting: Nurse Practitioner

## 2016-11-14 DIAGNOSIS — N959 Unspecified menopausal and perimenopausal disorder: Secondary | ICD-10-CM | POA: Diagnosis not present

## 2016-11-14 DIAGNOSIS — Z0001 Encounter for general adult medical examination with abnormal findings: Secondary | ICD-10-CM | POA: Diagnosis not present

## 2016-11-14 DIAGNOSIS — Z1231 Encounter for screening mammogram for malignant neoplasm of breast: Secondary | ICD-10-CM

## 2016-11-14 DIAGNOSIS — R7301 Impaired fasting glucose: Secondary | ICD-10-CM | POA: Diagnosis not present

## 2016-11-14 DIAGNOSIS — E039 Hypothyroidism, unspecified: Secondary | ICD-10-CM | POA: Diagnosis not present

## 2016-11-29 ENCOUNTER — Ambulatory Visit
Admission: RE | Admit: 2016-11-29 | Discharge: 2016-11-29 | Disposition: A | Discharge status: Home / Self Care | Payer: 59 | Source: Ambulatory Visit | Attending: Nurse Practitioner | Admitting: Nurse Practitioner

## 2016-11-29 DIAGNOSIS — Z1231 Encounter for screening mammogram for malignant neoplasm of breast: Secondary | ICD-10-CM | POA: Insufficient documentation

## 2017-02-06 DIAGNOSIS — L570 Actinic keratosis: Secondary | ICD-10-CM | POA: Diagnosis not present

## 2017-02-06 DIAGNOSIS — L578 Other skin changes due to chronic exposure to nonionizing radiation: Secondary | ICD-10-CM | POA: Diagnosis not present

## 2017-02-06 DIAGNOSIS — Z859 Personal history of malignant neoplasm, unspecified: Secondary | ICD-10-CM | POA: Diagnosis not present

## 2017-04-14 ENCOUNTER — Encounter: Payer: Self-pay | Admitting: Nurse Practitioner

## 2017-04-14 ENCOUNTER — Ambulatory Visit (INDEPENDENT_AMBULATORY_CARE_PROVIDER_SITE_OTHER): Payer: 59 | Admitting: Nurse Practitioner

## 2017-04-14 VITALS — BP 126/82 | HR 66 | Resp 16 | Ht 69.0 in | Wt 184.6 lb

## 2017-04-14 DIAGNOSIS — G441 Vascular headache, not elsewhere classified: Secondary | ICD-10-CM | POA: Insufficient documentation

## 2017-04-14 DIAGNOSIS — E538 Deficiency of other specified B group vitamins: Secondary | ICD-10-CM | POA: Insufficient documentation

## 2017-04-14 DIAGNOSIS — E663 Overweight: Secondary | ICD-10-CM | POA: Diagnosis not present

## 2017-04-14 DIAGNOSIS — R55 Syncope and collapse: Secondary | ICD-10-CM | POA: Insufficient documentation

## 2017-04-14 DIAGNOSIS — M545 Low back pain, unspecified: Secondary | ICD-10-CM | POA: Insufficient documentation

## 2017-04-14 DIAGNOSIS — D519 Vitamin B12 deficiency anemia, unspecified: Secondary | ICD-10-CM | POA: Insufficient documentation

## 2017-04-14 DIAGNOSIS — Z6829 Body mass index (BMI) 29.0-29.9, adult: Secondary | ICD-10-CM | POA: Insufficient documentation

## 2017-04-14 DIAGNOSIS — E559 Vitamin D deficiency, unspecified: Secondary | ICD-10-CM | POA: Diagnosis not present

## 2017-04-14 DIAGNOSIS — F419 Anxiety disorder, unspecified: Secondary | ICD-10-CM

## 2017-04-14 DIAGNOSIS — N959 Unspecified menopausal and perimenopausal disorder: Secondary | ICD-10-CM | POA: Diagnosis not present

## 2017-04-14 DIAGNOSIS — B373 Candidiasis of vulva and vagina: Secondary | ICD-10-CM | POA: Insufficient documentation

## 2017-04-14 DIAGNOSIS — E782 Mixed hyperlipidemia: Secondary | ICD-10-CM | POA: Insufficient documentation

## 2017-04-14 DIAGNOSIS — Z6827 Body mass index (BMI) 27.0-27.9, adult: Secondary | ICD-10-CM | POA: Insufficient documentation

## 2017-04-14 DIAGNOSIS — E539 Vitamin B deficiency, unspecified: Secondary | ICD-10-CM | POA: Insufficient documentation

## 2017-04-14 DIAGNOSIS — B3731 Acute candidiasis of vulva and vagina: Secondary | ICD-10-CM | POA: Insufficient documentation

## 2017-04-14 MED ORDER — PHENDIMETRAZINE TARTRATE ER 105 MG PO CP24: 1.0000 | ORAL_CAPSULE | Freq: Every day | ORAL | 0 refills | Status: DC

## 2017-04-14 NOTE — Progress Notes (Signed)
[facility] [place], [place] [address]  Internal MEDICINE  Office Visit Note  Patient Name: Ruth Morales  [id number]  [id number]  Date of Service: 04/14/2017     Complaints/HPI Pt is here for routine follow up.  The patient is here for routine follow up for weight management. Currently taking Bontril SR fo suppress appetite. She did not have the medication for most of the past month. Made it through holiday season with less than 1 pound weight gain. No negative side effects associated with taking appetite suppressant.     Current Medication: Outpatient Encounter Medications as of 04/14/2017  Medication Sig Note  . buPROPion (WELLBUTRIN) 100 MG tablet Take 100 mg by mouth as needed.   Marland Kitchen estradiol (ESTRACE) 1 MG tablet Take 1 mg by mouth daily.   Marland Kitchen loperamide (IMODIUM) 2 MG capsule Take 2 mg by mouth as needed for diarrhea or loose stools.   . Phendimetrazine Tartrate 105 MG CP24 Take by mouth daily.   . progesterone (PROMETRIUM) 100 MG capsule Take 100 mg by mouth daily.   . Vitamin D, Ergocalciferol, (DRISDOL) 50000 UNITS CAPS capsule Take 5,000 Units by mouth daily.   Marland Kitchen azithromycin (ZITHROMAX Z-PAK) 250 MG tablet 2 tabs po once day 1, then 1 tab po qd for next 4 days (Patient not taking: Reported on 04/14/2017)   . cyanocobalamin (,VITAMIN B-12,) 1000 MCG/ML injection Inject into the muscle every 30 (thirty) days.  02/05/2015: Received from: [place]  . lidocaine (XYLOCAINE) 2 % solution Use as directed 15 mLs in the mouth or throat every 8 (eight) hours as needed (sore throat. gargle and spit as needed for sore throat.). (Patient not taking: Reported on 04/14/2017)    No facility-administered encounter medications on file as of 04/14/2017.     Surgical History: Past Surgical History:  Procedure Laterality Date  . BREAST BIOPSY Left 2003   needle bx-neg  . BREAST SURGERY Bilateral 2004   breast reduction  . COLON SURGERY  2004   colon  removed  . EXTRACORPOREAL SHOCK WAVE LITHOTRIPSY Right 02/26/2015   Procedure: EXTRACORPOREAL SHOCK WAVE LITHOTRIPSY (ESWL);  Surgeon: Collier Flowers, MD;  Location: [facility] ORS;  Service: Urology;  Laterality: Right;  . REDUCTION MAMMAPLASTY Bilateral 2005    Medical History: Past Medical History:  Diagnosis Date  . Anxiety 11/21/2013  . Breath shortness 11/21/2013  . Crohn disease ([place])   . Dizziness 11/21/2013  . Extensor tenosynovitis of wrist 12/24/2013  . Ganglion cyst of wrist 12/24/2013  . Hypotension, postural 11/22/2013  . Skin cancer     Family History: Family History  Problem Relation Age of Onset  . Diabetes Father   . Diabetes Mother   . Heart disease Maternal Grandmother   . Breast cancer Neg Hx     Social History   Socioeconomic History  . Marital status: Married    Spouse name: Not on file  . Number of children: Not on file  . Years of education: Not on file  . Highest education level: Not on file  Social Needs  . Financial resource strain: Not on file  . Food insecurity - worry: Not on file  . Food insecurity - inability: Not on file  . Transportation needs - medical: Not on file  . Transportation needs - non-medical: Not on file  Occupational History  . Not on file  Tobacco Use  . Smoking status: Former Research scientist (life sciences)  . Smokeless tobacco: Never Used  Substance  and Sexual Activity  . Alcohol use: Yes    Comment: occasional drink, once a month  . Drug use: No  . Sexual activity: Yes    Partners: Male  Other Topics Concern  . Not on file  Social History Narrative  . Not on file    Today's Vitals   04/14/17 0840  BP: 126/82  Pulse: 66  Resp: 16  SpO2: 99%  Weight: 184 lb 9.6 oz (83.7 kg)  Height: 5\' 9"  (1.753 m)    Review of Systems  Constitutional: Negative for chills and fever.       1 pound weight gain sonce most recent visit   HENT: Negative for sinus pain and sore throat.   Eyes: Negative.   Respiratory: Negative for cough, shortness of  breath and wheezing.   Cardiovascular: Negative for chest pain and orthopnea.  Gastrointestinal: Negative for constipation, diarrhea and nausea.  Genitourinary: Negative.   Musculoskeletal: Negative for neck pain.  Neurological: Negative for dizziness, weakness and headaches.  Psychiatric/Behavioral: Negative for depression. The patient is nervous/anxious.   All other systems reviewed and are negative.     Physical Exam  Constitutional: She is oriented to person, place, and time and well-developed, well-nourished, and in no distress.  HENT:  Head: Normocephalic and atraumatic.  Eyes: Pupils are equal, round, and reactive to light.  Neck: Normal range of motion. Neck supple. No thyromegaly present.  Cardiovascular: Normal rate, regular rhythm and normal heart sounds.  Pulmonary/Chest: Effort normal and breath sounds normal. She has no wheezes.  Abdominal: Soft. Bowel sounds are normal. There is no tenderness.  Musculoskeletal: Normal range of motion.  Neurological: She is alert and oriented to person, place, and time.  Skin: Skin is warm and dry.  Psychiatric: Affect normal.  Nursing note and vitals reviewed.    ICD-10-CM   1. Overweight E66.3   2. Vitamin D deficiency, unspecified E55.9   3. Anxiety F41.9   4. Menopausal and perimenopausal disorder N95.9    1. Renew bontril SR 105mg  daily. Recommend 1200 calorie diet and participation in regular exercise program.  2. Continue OTC vitamin d every day 3. Has not started wellbutrin yet as work related stress has improved. Will keep on active medication list in case she starts this medication 4. Continue estradiol and progresterone as prescribed. Will check into rx for Addyi to help improve libido.    General Counseling: I have discussed the findings of the evaluation and examination with Ruth Morales.  I have also discussed any further diagnostic evaluation that may be needed or ordered today. Ruth Morales verbalizes understanding of the  findings of todays visit. We also reviewed her medications today. she has been encouraged to call the office with any questions or concerns that should arise related to todays visit.  This patient was seen by Leretha Pol, FNP- C in Collaboration with Dr Lavera Guise as a part of collaborative care agreement  She should follow up in 6 weeks  Patient Active Problem List   Diagnosis Date Noted  . Overweight 04/14/2017  . Candidiasis of vulva and vagina 04/14/2017  . Mixed hyperlipidemia 04/14/2017  . Syncope and collapse 04/14/2017  . Low back pain 04/14/2017  . Vitamin D deficiency, unspecified 04/14/2017  . Vitamin B12 deficiency anemia, unspecified 04/14/2017  . Vitamin B deficiency, unspecified 04/14/2017  . Vascular headache, not elsewhere classified 04/14/2017  . Extensor tenosynovitis of wrist 12/24/2013  . Ganglion cyst of wrist 12/24/2013  . Hypotension, postural 11/22/2013  . Anxiety  11/21/2013  . Dizziness 11/21/2013  . Breath shortness 11/21/2013

## 2017-05-26 ENCOUNTER — Encounter: Payer: Self-pay | Admitting: Nurse Practitioner

## 2017-05-26 ENCOUNTER — Ambulatory Visit (INDEPENDENT_AMBULATORY_CARE_PROVIDER_SITE_OTHER): Payer: 59 | Admitting: Nurse Practitioner

## 2017-05-26 VITALS — BP 125/69 | HR 69 | Resp 16 | Ht 68.5 in | Wt 179.8 lb

## 2017-05-26 DIAGNOSIS — E663 Overweight: Secondary | ICD-10-CM | POA: Diagnosis not present

## 2017-05-26 DIAGNOSIS — N959 Unspecified menopausal and perimenopausal disorder: Secondary | ICD-10-CM | POA: Diagnosis not present

## 2017-05-26 MED ORDER — PHENDIMETRAZINE TARTRATE ER 105 MG PO CP24: 1.0000 | ORAL_CAPSULE | Freq: Every day | ORAL | 1 refills | Status: DC

## 2017-05-26 NOTE — Progress Notes (Signed)
[facility] [place], [place] [address]  Internal MEDICINE  Office Visit Note  Patient Name: Ruth Morales  [id number]  [id number]  Date of Service: 05/26/2017  Chief Complaint  Patient presents with  . Follow-up    6 week weight management    The patient is here for routine follow up. Restarted Botril 105mg  daily at her last visit. She has lost 5 pounds since then . Is walking on treadmill a few times a week. Lowered her calorie intake. Doing well with no negative side effects .    Pt is here for routine follow up.    Current Medication: Outpatient Encounter Medications as of 05/26/2017  Medication Sig Note  . ciprofloxacin (CIPRO) 500 MG tablet    . cyclobenzaprine (FLEXERIL) 10 MG tablet Take by mouth.   . estradiol (ESTRACE) 1 MG tablet Take 1 mg by mouth daily.   Marland Kitchen loperamide (IMODIUM) 2 MG capsule Take 2 mg by mouth as needed for diarrhea or loose stools.   . Phendimetrazine Tartrate 105 MG CP24 Take 1 capsule (105 mg total) by mouth daily.   . progesterone (PROMETRIUM) 100 MG capsule Take 100 mg by mouth daily.   . [DISCONTINUED] Phendimetrazine Tartrate 105 MG CP24 Take 1 capsule (105 mg total) by mouth daily.   . [DISCONTINUED] Phendimetrazine Tartrate 105 MG CP24 Take 1 capsule (105 mg total) by mouth daily.   . [DISCONTINUED] azithromycin (ZITHROMAX Z-PAK) 250 MG tablet 2 tabs po once day 1, then 1 tab po qd for next 4 days (Patient not taking: Reported on 04/14/2017)   . [DISCONTINUED] buPROPion (WELLBUTRIN) 100 MG tablet Take 100 mg by mouth as needed.   . [DISCONTINUED] cyanocobalamin (,VITAMIN B-12,) 1000 MCG/ML injection Inject into the muscle every 30 (thirty) days.  02/05/2015: Received from: [facility]  . [DISCONTINUED] lidocaine (XYLOCAINE) 2 % solution Use as directed 15 mLs in the mouth or throat every 8 (eight) hours as needed (sore throat. gargle and spit as needed for sore throat.). (Patient not taking: Reported on  04/14/2017)   . [DISCONTINUED] phentermine 37.5 MG capsule Take by mouth.   . [DISCONTINUED] Vitamin D, Ergocalciferol, (DRISDOL) 50000 UNITS CAPS capsule Take 5,000 Units by mouth daily.    No facility-administered encounter medications on file as of 05/26/2017.     Surgical History: Past Surgical History:  Procedure Laterality Date  . BREAST BIOPSY Left 2003   needle bx-neg  . BREAST SURGERY Bilateral 2004   breast reduction  . COLON SURGERY  2004   colon removed  . EXTRACORPOREAL SHOCK WAVE LITHOTRIPSY Right 02/26/2015   Procedure: EXTRACORPOREAL SHOCK WAVE LITHOTRIPSY (ESWL);  Surgeon: Collier Flowers, MD;  Location: [facility] ORS;  Service: Urology;  Laterality: Right;  . REDUCTION MAMMAPLASTY Bilateral 2005    Medical History: Past Medical History:  Diagnosis Date  . Anxiety 11/21/2013  . Breath shortness 11/21/2013  . Crohn disease ([place])   . Dizziness 11/21/2013  . Extensor tenosynovitis of wrist 12/24/2013  . Ganglion cyst of wrist 12/24/2013  . Hypotension, postural 11/22/2013  . Skin cancer     Family History: Family History  Problem Relation Age of Onset  . Diabetes Father   . Diabetes Mother   . Heart disease Maternal Grandmother   . Breast cancer Neg Hx     Social History   Socioeconomic History  . Marital status: Married    Spouse name: Not on file  . Number of children: Not on file  .  Years of education: Not on file  . Highest education level: Not on file  Social Needs  . Financial resource strain: Not on file  . Food insecurity - worry: Not on file  . Food insecurity - inability: Not on file  . Transportation needs - medical: Not on file  . Transportation needs - non-medical: Not on file  Occupational History  . Not on file  Tobacco Use  . Smoking status: Former Research scientist (life sciences)  . Smokeless tobacco: Never Used  Substance and Sexual Activity  . Alcohol use: Yes    Comment: occasional drink, once a month  . Drug use: No  . Sexual activity: Yes    Partners:  Male  Other Topics Concern  . Not on file  Social History Narrative  . Not on file   Review of Systems  Constitutional: Negative for chills and fever.       5 pound weight loss since last visit .  HENT: Negative for sinus pain and sore throat.   Eyes: Negative.   Respiratory: Negative for cough, shortness of breath and wheezing.   Cardiovascular: Negative for chest pain and orthopnea.  Gastrointestinal: Negative for constipation, diarrhea and nausea.  Genitourinary: Negative.   Musculoskeletal: Negative for neck pain.  Neurological: Negative for dizziness, weakness and headaches.  Psychiatric/Behavioral: Negative for depression. The patient is nervous/anxious.   All other systems reviewed and are negative.     Today's Vitals   05/26/17 0949  BP: 125/69  Pulse: 69  Resp: 16  SpO2: 99%  Weight: 179 lb 12.8 oz (81.6 kg)  Height: 5' 8.5" (1.74 m)   Physical Exam  Constitutional: She is oriented to person, place, and time. She appears well-developed and well-nourished. No distress.  HENT:  Head: Normocephalic and atraumatic.  Mouth/Throat: Oropharynx is clear and moist. No oropharyngeal exudate.  Eyes: EOM are normal. Pupils are equal, round, and reactive to light.  Neck: Normal range of motion. Neck supple. No JVD present. No tracheal deviation present. No thyromegaly present.  Cardiovascular: Normal rate, regular rhythm and normal heart sounds. Exam reveals no gallop and no friction rub.  No murmur heard. Pulmonary/Chest: Effort normal. No respiratory distress. She has no wheezes. She has no rales. She exhibits no tenderness.  Abdominal: Soft. Bowel sounds are normal.  Musculoskeletal: Normal range of motion.  Lymphadenopathy:    She has no cervical adenopathy.  Neurological: She is alert and oriented to person, place, and time. No cranial nerve deficit.  Skin: Skin is warm and dry. She is not diaphoretic.  Psychiatric: She has a normal mood and affect. Her behavior is  normal. Judgment and thought content normal.  Nursing note and vitals reviewed.  Assessment/Plan:  .1. Overweight - Phendimetrazine Tartrate 105 MG CP24; Take 1 capsule (105 mg total) by mouth daily.  Dispense: 30 capsule; Refill: 1 Continue low calorie diet and regular exercise routine  2. Unspecified menopausal and perimenopausal disorder Continue hormone therapy as prescribed  General Counseling: Maevyn verbalizes understanding of the findings of todays visit and agrees with plan of treatment. I have discussed any further diagnostic evaluation that may be needed or ordered today. We also reviewed her medications today. she has been encouraged to call the office with any questions or concerns that should arise related to todays visit.   This patient was seen by Leretha Pol, FNP- C in Collaboration with Dr Lavera Guise as a part of collaborative care agreement     Meds ordered this encounter  Medications  .  DISCONTD: Phendimetrazine Tartrate 105 MG CP24    Sig: Take 1 capsule (105 mg total) by mouth daily.    Dispense:  30 capsule    Refill:  1    Order Specific Question:   Supervising Provider    Answer:   Lavera Guise [[id number]]  . Phendimetrazine Tartrate 105 MG CP24    Sig: Take 1 capsule (105 mg total) by mouth daily.    Dispense:  30 capsule    Refill:  1    Order Specific Question:   Supervising Provider    Answer:   Lavera Guise [[id number]]    Time spent: 69 Minutes     Dr Lavera Guise Internal medicine

## 2017-06-20 ENCOUNTER — Ambulatory Visit (INDEPENDENT_AMBULATORY_CARE_PROVIDER_SITE_OTHER): Payer: 59 | Admitting: Internal Medicine

## 2017-06-20 ENCOUNTER — Encounter: Payer: Self-pay | Admitting: Internal Medicine

## 2017-06-20 ENCOUNTER — Telehealth: Payer: Self-pay

## 2017-06-20 VITALS — BP 142/88 | HR 63 | Temp 97.7°F | Resp 16 | Ht 65.5 in | Wt 181.0 lb

## 2017-06-20 DIAGNOSIS — N952 Postmenopausal atrophic vaginitis: Secondary | ICD-10-CM | POA: Diagnosis not present

## 2017-06-20 DIAGNOSIS — R300 Dysuria: Secondary | ICD-10-CM | POA: Diagnosis not present

## 2017-06-20 DIAGNOSIS — R030 Elevated blood-pressure reading, without diagnosis of hypertension: Secondary | ICD-10-CM | POA: Diagnosis not present

## 2017-06-20 DIAGNOSIS — R102 Pelvic and perineal pain: Secondary | ICD-10-CM

## 2017-06-20 LAB — POCT URINALYSIS DIPSTICK
Bilirubin, UA: NEGATIVE
GLUCOSE UA: NEGATIVE
Ketones, UA: NEGATIVE
LEUKOCYTES UA: NEGATIVE
Nitrite, UA: NEGATIVE
RBC UA: NEGATIVE
Spec Grav, UA: 1.01 (ref 1.010–1.025)
Urobilinogen, UA: 0.2 E.U./dL
pH, UA: 5 (ref 5.0–8.0)

## 2017-06-20 MED ORDER — ESTROGENS, CONJUGATED 0.625 MG/GM VA CREA: TOPICAL_CREAM | VAGINAL | 3 refills | Status: DC

## 2017-06-20 MED ORDER — ESTRADIOL 0.025 MG/24HR TD PTWK: 0.0250 mg | MEDICATED_PATCH | TRANSDERMAL | 12 refills | Status: DC

## 2017-06-20 NOTE — Progress Notes (Signed)
[facility] [place], [place] [address]  Internal MEDICINE  Office Visit Note  Patient Name: Ruth Morales  [id number]  [id number]  Date of Service: 06/20/2017  Chief Complaint  Patient presents with  . Urinary Tract Infection    going on for few weeks      Urinary Tract Infection   This is a recurrent problem. The current episode started in the past 7 days. The problem occurs intermittently. The problem has been gradually improving. Associated symptoms include flank pain. Pertinent negatives include no chills, frequency, nausea or vomiting. She has tried nothing for the symptoms. The treatment provided mild relief.   Pt is here for a sick visit.c/o burning even without going to use the bathroom, c/o severe dryness, she is not sexually active at this time. bp is slightly elevated   Medical History: Past Medical History:  Diagnosis Date  . Anxiety 11/21/2013  . Breath shortness 11/21/2013  . Crohn disease ([place])   . Dizziness 11/21/2013  . Extensor tenosynovitis of wrist 12/24/2013  . Ganglion cyst of wrist 12/24/2013  . Hypotension, postural 11/22/2013  . Skin cancer      Vital Signs: BP (!) 142/88   Pulse 63   Temp 97.7 F (36.5 C)   Resp 16   Ht 5' 5.5" (1.664 m)   Wt 181 lb (82.1 kg)   SpO2 100%   BMI 29.66 kg/m    Review of Systems  Constitutional: Negative for chills, fatigue and unexpected weight change.  HENT: Negative for postnasal drip and rhinorrhea.   Respiratory: Negative for cough and shortness of breath.   Cardiovascular: Negative for chest pain.  Gastrointestinal: Positive for abdominal pain. Negative for constipation, diarrhea, nausea and vomiting.  Genitourinary: Positive for dysuria, flank pain and pelvic pain. Negative for frequency.  Musculoskeletal: Negative for arthralgias, back pain, joint swelling and neck pain.  Skin: Negative for rash.  Neurological: Negative.  Negative for tremors and numbness.   Psychiatric/Behavioral: Negative for sleep disturbance and suicidal ideas. Behavioral problem: Depression. The patient is not nervous/anxious.     Physical Exam  Constitutional: She appears well-developed and well-nourished.  HENT:  Head: Normocephalic and atraumatic.  Eyes: Conjunctivae and EOM are normal. Pupils are equal, round, and reactive to light.  Neck: Normal range of motion.  Cardiovascular: Normal rate and regular rhythm.  Pulmonary/Chest: Effort normal and breath sounds normal.  Abdominal: Soft.  Genitourinary:  Genitourinary Comments: Suprapubic tenderness present   Assessment/Plan: 1. Dysuria - POCT Urinalysis Dipstick, Neg send urine for c/s  2. Atrophic vaginitis - DC estradiol tabs, start estradiol (CLIMARA - DOSED IN MG/24 HR) 0.025 mg/24hr patch; Place 1 patch (0.025 mg total) onto the skin once a week.  Dispense: 4 patch; Refill: 12 - conjugated estrogens (PREMARIN) vaginal cream; Use a small 1/3 applicator at night intravaginal  Dispense: 42.5 g; Refill: 3  3. Elevated BP without diagnosis of hypertension - Monitor at home   4. Pelvic pain - US PELVIS (TRANSABDOMINAL ONLY); Future  General Counseling: Marietta verbalizes understanding of the findings of todays visit and agrees with plan of treatment. I have discussed any further diagnostic evaluation that may be needed or ordered today. We also reviewed her medications today. she has been encouraged to call the office with any questions or concerns that should arise related to todays visit. Monitor bp slightly elevated   Orders Placed This Encounter  Procedures  . US PELVIS (TRANSABDOMINAL ONLY)  . POCT Urinalysis Dipstick  Meds ordered this encounter  Medications  . estradiol (CLIMARA - DOSED IN MG/24 HR) 0.025 mg/24hr patch    Sig: Place 1 patch (0.025 mg total) onto the skin once a week.    Dispense:  4 patch    Refill:  12  . conjugated estrogens (PREMARIN) vaginal cream    Sig: Use a small 1/3  applicator at night intravaginal    Dispense:  42.5 g    Refill:  3    Time spent:25 Minutes

## 2017-06-20 NOTE — Telephone Encounter (Signed)
Called pt per DFK and advised her to keep a check on her Blood pressure with her being on weight loss medications.  dbs

## 2017-06-22 ENCOUNTER — Other Ambulatory Visit: Payer: Self-pay | Admitting: Nurse Practitioner

## 2017-06-22 NOTE — Progress Notes (Signed)
Patient scheduled for US Transabdomen on 06/26/17 @ 3:15 [place]; pt should come with full blader; I also faxed a savings card for premarin to her pharmacy.Ruth Morales

## 2017-06-25 IMAGING — CR DG ABDOMEN 1V
2 series · 2 of 2 positions shown · non-contrast
Comparison: March 12, 2014

CLINICAL DATA: Right flank pain for 2 days

EXAM:
ABDOMEN - 1 VIEW

[abdomen kub (1 of 2)]
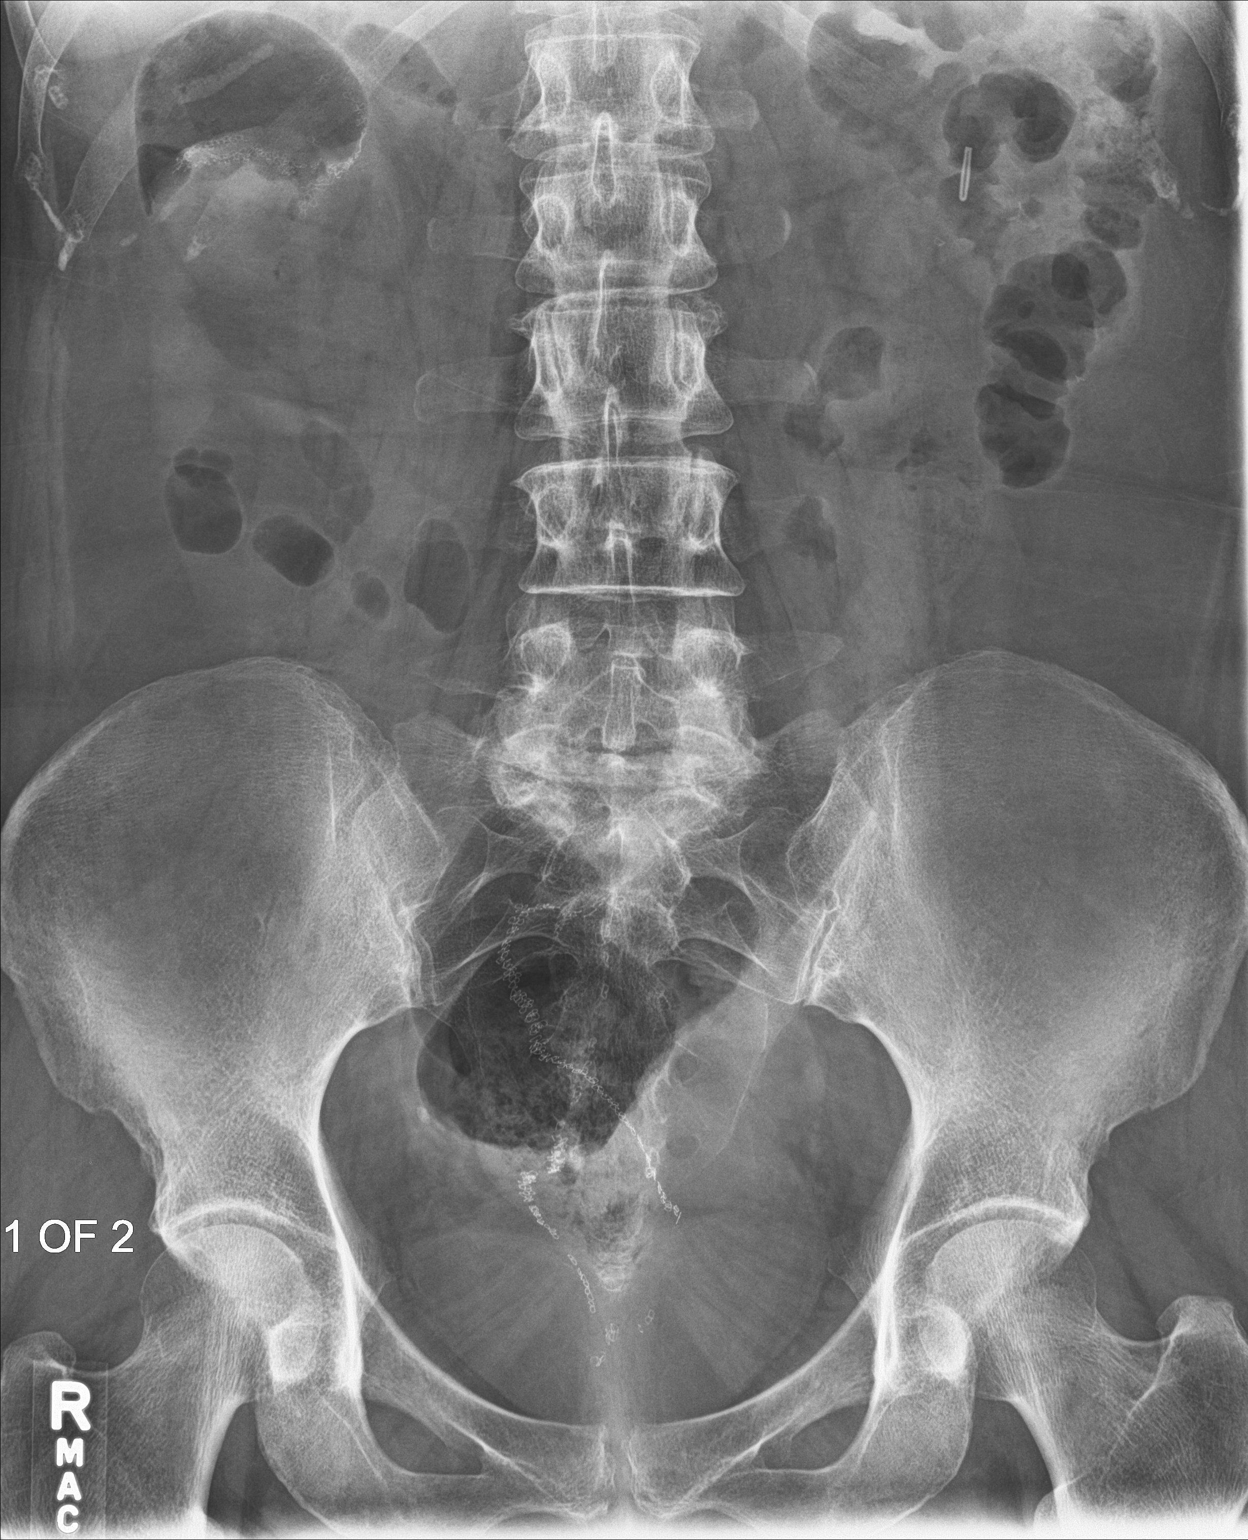

[abdomen kub (2 of 2)]
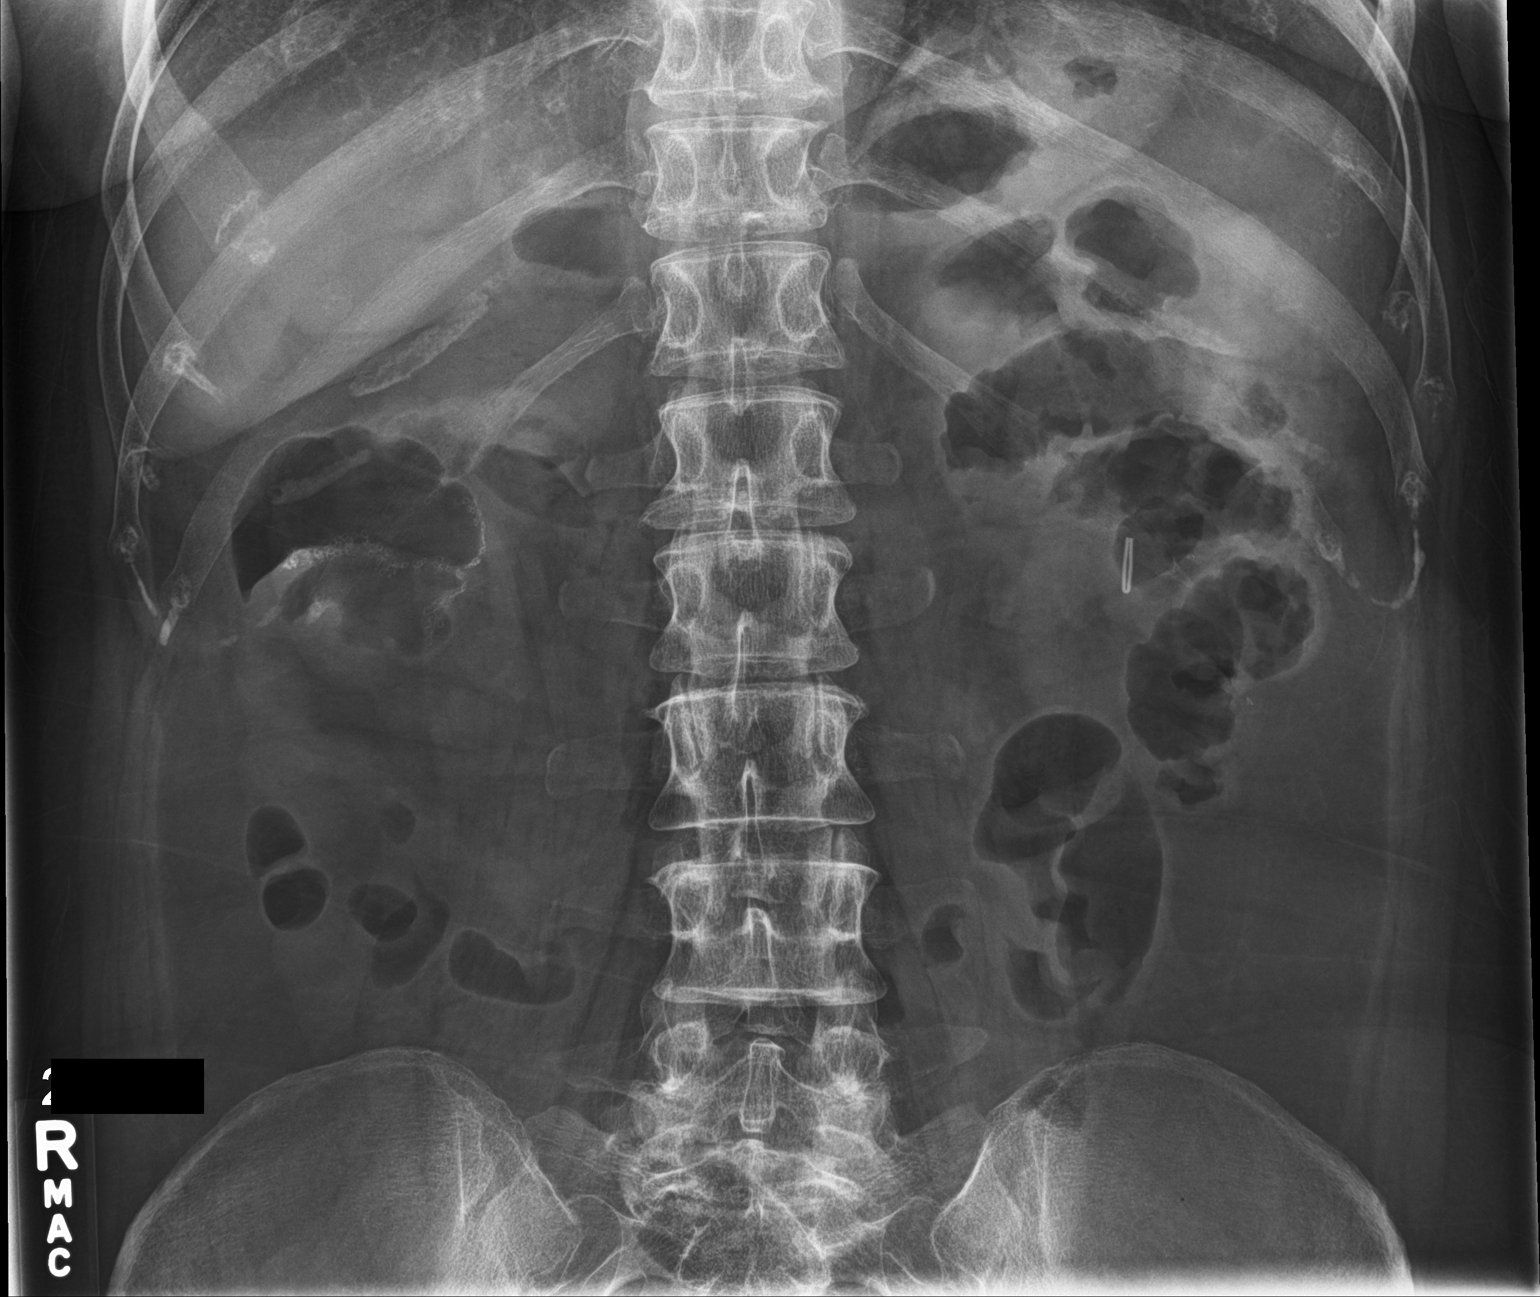

[2 of 2 positions shown; findings below may reference images not displayed]

FINDINGS: There are multiple surgical clips in the upper abdomen and pelvic
regions. There is a 5 mm calcification in the upper right pelvis
which was not present on prior study. No other abnormal
calcifications are identified. There is moderate stool in the colon.
The bowel gas pattern is normal.
IMPRESSION: 5 mm calcification in the upper right pelvis. Suspect phlebolith,
although a ureteral calculus could present in this manner. There are
multiple surgical clips present. Bowel gas pattern unremarkable. No
obstruction or free air apparent on this supine examination.

## 2017-06-26 ENCOUNTER — Ambulatory Visit
Admission: RE | Admit: 2017-06-26 | Discharge: 2017-06-26 | Disposition: A | Discharge status: Home / Self Care | Payer: 59 | Source: Ambulatory Visit | Attending: Internal Medicine | Admitting: Internal Medicine

## 2017-06-26 DIAGNOSIS — R102 Pelvic and perineal pain: Secondary | ICD-10-CM | POA: Insufficient documentation

## 2017-06-29 ENCOUNTER — Telehealth: Payer: Self-pay

## 2017-06-30 NOTE — Telephone Encounter (Signed)
Sent request to provider for results of [place].  dbs

## 2017-06-30 NOTE — Telephone Encounter (Signed)
Looks like no acute abnormalities were noted, but study is limited because they did study transabdominally. Might be better done transvaginally. We can order second study to be done this way, which does show the ovaries much better, if she would like.

## 2017-07-03 ENCOUNTER — Telehealth: Payer: Self-pay

## 2017-07-03 NOTE — Telephone Encounter (Signed)
Spoke to pt and advised the [place] results.  Pt would like to be scheduled to have transvaginal US.  Will sent to [facility] to get [place] set up.  dbs

## 2017-07-04 ENCOUNTER — Ambulatory Visit: Payer: Self-pay | Admitting: Nurse Practitioner

## 2017-07-05 ENCOUNTER — Other Ambulatory Visit: Payer: Self-pay | Admitting: Nurse Practitioner

## 2017-07-05 ENCOUNTER — Telehealth: Payer: Self-pay

## 2017-07-05 DIAGNOSIS — R102 Pelvic and perineal pain: Secondary | ICD-10-CM

## 2017-07-05 NOTE — Telephone Encounter (Signed)
Patient has been scheduled for US transvaginal on 07/10/17 @ 1:45 [place]

## 2017-07-10 ENCOUNTER — Ambulatory Visit (INDEPENDENT_AMBULATORY_CARE_PROVIDER_SITE_OTHER): Payer: 59 | Admitting: Nurse Practitioner

## 2017-07-10 ENCOUNTER — Other Ambulatory Visit: Payer: 59

## 2017-07-10 ENCOUNTER — Encounter: Payer: Self-pay | Admitting: Nurse Practitioner

## 2017-07-10 VITALS — BP 128/80 | HR 69 | Resp 16 | Ht 68.5 in | Wt 181.0 lb

## 2017-07-10 DIAGNOSIS — N390 Urinary tract infection, site not specified: Secondary | ICD-10-CM

## 2017-07-10 DIAGNOSIS — E663 Overweight: Secondary | ICD-10-CM | POA: Diagnosis not present

## 2017-07-10 DIAGNOSIS — R319 Hematuria, unspecified: Secondary | ICD-10-CM

## 2017-07-10 DIAGNOSIS — R300 Dysuria: Secondary | ICD-10-CM

## 2017-07-10 LAB — POCT URINALYSIS DIPSTICK
Glucose, UA: NEGATIVE
Ketones, UA: NEGATIVE
NITRITE UA: NEGATIVE
PH UA: 5 (ref 5.0–8.0)
Spec Grav, UA: >=1.03 — AB (ref 1.010–1.025)
UROBILINOGEN UA: 0.2 U/dL

## 2017-07-10 MED ORDER — CIPROFLOXACIN HCL 500 MG PO TABS: 500.0000 mg | ORAL_TABLET | Freq: Two times a day (BID) | ORAL | 0 refills | Status: DC

## 2017-07-10 MED ORDER — PHENDIMETRAZINE TARTRATE ER 105 MG PO CP24: 1.0000 | ORAL_CAPSULE | Freq: Every day | ORAL | 1 refills | Status: DC

## 2017-07-10 NOTE — Progress Notes (Signed)
[facility] [place], [place] [address]  Internal MEDICINE  Office Visit Note  Patient Name: Ruth Morales  [id number]  [id number]  Date of Service: 08/02/2017   Pt is here for routine follow up.   Chief Complaint  Patient presents with  . Urinary Tract Infection  . Weight Loss    Urinary Tract Infection   This is a recurrent problem. The current episode started in the past 7 days. The problem occurs intermittently. The problem has been gradually improving. The quality of the pain is described as aching. The pain is at a severity of 2/10. The pain is mild. There has been no fever. She is sexually active. There is no history of pyelonephritis. Associated symptoms include flank pain, frequency and urgency. Pertinent negatives include no chills, nausea or vomiting. She has tried antibiotics for the symptoms. The treatment provided mild relief. Her past medical history is significant for recurrent UTIs.       Current Medication: Outpatient Encounter Medications as of 07/10/2017  Medication Sig  . ciprofloxacin (CIPRO) 500 MG tablet Take 1 tablet (500 mg total) by mouth 2 (two) times daily.  Marland Kitchen conjugated estrogens (PREMARIN) vaginal cream Use a small 1/3 applicator at night intravaginal  . cyclobenzaprine (FLEXERIL) 10 MG tablet Take by mouth.  . estradiol (CLIMARA - DOSED IN MG/24 HR) 0.025 mg/24hr patch Place 1 patch (0.025 mg total) onto the skin once a week.  . loperamide (IMODIUM) 2 MG capsule Take 2 mg by mouth as needed for diarrhea or loose stools.  . Phendimetrazine Tartrate 105 MG CP24 Take 1 capsule (105 mg total) by mouth daily.  . [DISCONTINUED] ciprofloxacin (CIPRO) 500 MG tablet   . [DISCONTINUED] Phendimetrazine Tartrate 105 MG CP24 Take 1 capsule (105 mg total) by mouth daily.  . [DISCONTINUED] progesterone (PROMETRIUM) 100 MG capsule Take 100 mg by mouth daily.   No facility-administered encounter medications on file as of 07/10/2017.      Surgical History: Past Surgical History:  Procedure Laterality Date  . BREAST BIOPSY Left 2003   needle bx-neg  . BREAST SURGERY Bilateral 2004   breast reduction  . COLON SURGERY  2004   colon removed  . EXTRACORPOREAL SHOCK WAVE LITHOTRIPSY Right 02/26/2015   Procedure: EXTRACORPOREAL SHOCK WAVE LITHOTRIPSY (ESWL);  Surgeon: Collier Flowers, MD;  Location: [facility] ORS;  Service: Urology;  Laterality: Right;  . REDUCTION MAMMAPLASTY Bilateral 2005    Medical History: Past Medical History:  Diagnosis Date  . Anxiety 11/21/2013  . Breath shortness 11/21/2013  . Crohn disease ([place])   . Dizziness 11/21/2013  . Extensor tenosynovitis of wrist 12/24/2013  . Ganglion cyst of wrist 12/24/2013  . Hypotension, postural 11/22/2013  . Skin cancer     Family History: Family History  Problem Relation Age of Onset  . Diabetes Father   . Diabetes Mother   . Heart disease Maternal Grandmother   . Breast cancer Neg Hx     Social History   Socioeconomic History  . Marital status: Married    Spouse name: Not on file  . Number of children: Not on file  . Years of education: Not on file  . Highest education level: Not on file  Occupational History  . Not on file  Social Needs  . Financial resource strain: Not on file  . Food insecurity:    Worry: Not on file    Inability: Not on file  . Transportation needs:    Medical: Not on  file    Non-medical: Not on file  Tobacco Use  . Smoking status: Former Research scientist (life sciences)  . Smokeless tobacco: Never Used  Substance and Sexual Activity  . Alcohol use: Yes    Comment: occasional drink, once a month  . Drug use: No  . Sexual activity: Yes    Partners: Male  Lifestyle  . Physical activity:    Days per week: Not on file    Minutes per session: Not on file  . Stress: Not on file  Relationships  . Social connections:    Talks on phone: Not on file    Gets together: Not on file    Attends religious service: Not on file    Active member of club  or organization: Not on file    Attends meetings of clubs or organizations: Not on file    Relationship status: Not on file  . Intimate partner violence:    Fear of current or ex partner: Not on file    Emotionally abused: Not on file    Physically abused: Not on file    Forced sexual activity: Not on file  Other Topics Concern  . Not on file  Social History Narrative  . Not on file      Review of Systems  Constitutional: Positive for fatigue. Negative for activity change, chills and unexpected weight change.       No weight gain or weight loss since her last visit.  HENT: Negative for congestion, postnasal drip, rhinorrhea and sore throat.   Eyes: Negative.   Respiratory: Negative for cough, shortness of breath and wheezing.   Cardiovascular: Negative for chest pain.  Gastrointestinal: Positive for abdominal pain. Negative for constipation, diarrhea, nausea and vomiting.  Endocrine: Negative for cold intolerance, heat intolerance, polydipsia, polyphagia and polyuria.  Genitourinary: Positive for dysuria, flank pain, frequency, pelvic pain and urgency.  Musculoskeletal: Negative for arthralgias, back pain, joint swelling and neck pain.  Skin: Negative for rash.  Allergic/Immunologic: Negative for environmental allergies.  Neurological: Positive for headaches. Negative for tremors and numbness.  Psychiatric/Behavioral: Negative for sleep disturbance and suicidal ideas. Behavioral problem: Depression. The patient is not nervous/anxious.     Today's Vitals   07/10/17 1551 07/10/17 1708  BP: (!) 146/83 128/80  Pulse: 69   Resp: 16   SpO2: 100%   Weight: 181 lb (82.1 kg)   Height: 5' 8.5" (1.74 m)     Physical Exam  Constitutional: She is oriented to person, place, and time. She appears well-developed and well-nourished.  HENT:  Head: Normocephalic and atraumatic.  Eyes: Pupils are equal, round, and reactive to light. Conjunctivae and EOM are normal.  Neck: Normal range of  motion. Neck supple. No thyromegaly present.  Cardiovascular: Normal rate, regular rhythm and normal heart sounds.  Pulmonary/Chest: Effort normal and breath sounds normal. She has no wheezes.  Abdominal: Soft. Bowel sounds are normal. There is tenderness.  Slight suprapubic tenderness present.   Genitourinary:  Genitourinary Comments: Urine sample positive for moderate WBC, trace blood, and trace protein.   Musculoskeletal: Normal range of motion.  Lymphadenopathy:    She has no cervical adenopathy.  Neurological: She is alert and oriented to person, place, and time. No cranial nerve deficit.  Skin: Skin is warm and dry.  Psychiatric: She has a normal mood and affect. Her behavior is normal. Judgment and thought content normal.  Nursing note and vitals reviewed.   Assessment/Plan: 1. Dysuria - POCT Urinalysis Dipstick positive for moderate WBC, trace blood,  and trace protein. Recommend OTC AZO to alleviate bladder pain/spasms.   2. Urinary tract infection with hematuria, site unspecified Treat with cipro 500mg  bid for 10 days. Adjust abx as indicated per culture and sensitivity.  - CULTURE, URINE COMPREHENSIVE - ciprofloxacin (CIPRO) 500 MG tablet; Take 1 tablet (500 mg total) by mouth 2 (two) times daily.  Dispense: 20 tablet; Refill: 0  3. Overweight Restart bontril SR 105mg  daily. Limit calorie intake to 1500 calories per day. Participate in routine exercise.  - Phendimetrazine Tartrate 105 MG CP24; Take 1 capsule (105 mg total) by mouth daily.  Dispense: 30 capsule; Refill: 1  General Counseling: Gerardo verbalizes understanding of the findings of todays visit and agrees with plan of treatment. I have discussed any further diagnostic evaluation that may be needed or ordered today. We also reviewed her medications today. she has been encouraged to call the office with any questions or concerns that should arise related to todays visit.  This patient was seen by Leretha Pol, FNP- C  in Collaboration with Dr Lavera Guise as a part of collaborative care agreement    Orders Placed This Encounter  Procedures  . CULTURE, URINE COMPREHENSIVE  . POCT Urinalysis Dipstick    Meds ordered this encounter  Medications  . ciprofloxacin (CIPRO) 500 MG tablet    Sig: Take 1 tablet (500 mg total) by mouth 2 (two) times daily.    Dispense:  20 tablet    Refill:  0    Order Specific Question:   Supervising Provider    Answer:   Lavera Guise [[id number]]  . Phendimetrazine Tartrate 105 MG CP24    Sig: Take 1 capsule (105 mg total) by mouth daily.    Dispense:  30 capsule    Refill:  1    Order Specific Question:   Supervising Provider    Answer:   Lavera Guise [[id number]]    Time spent: 36 Minutes     Dr Lavera Guise Internal medicine

## 2017-07-11 ENCOUNTER — Other Ambulatory Visit: Payer: Self-pay

## 2017-07-11 ENCOUNTER — Ambulatory Visit
Admission: RE | Admit: 2017-07-11 | Discharge: 2017-07-11 | Disposition: A | Discharge status: Home / Self Care | Payer: 59 | Source: Ambulatory Visit | Attending: Nurse Practitioner | Admitting: Nurse Practitioner

## 2017-07-11 DIAGNOSIS — R102 Pelvic and perineal pain: Secondary | ICD-10-CM | POA: Insufficient documentation

## 2017-07-11 MED ORDER — PROGESTERONE MICRONIZED 100 MG PO CAPS: 100.0000 mg | ORAL_CAPSULE | Freq: Every day | ORAL | 1 refills | Status: DC

## 2017-07-13 LAB — CULTURE, URINE COMPREHENSIVE

## 2017-07-13 NOTE — Progress Notes (Signed)
Patient started on cipro for uti at visit

## 2017-07-17 NOTE — Progress Notes (Signed)
Pt was notified to continue taking cipro.

## 2017-07-28 IMAGING — CT CT RENAL STONE PROTOCOL
2 of 4 series · 15 of 46 positions shown, 17 images · non-contrast
Comparison: 03/12/2014

CLINICAL DATA: RIGHT flank pain since 01/15/2015, no gross
hematuria, nephrolithiasis; personal history of Crohn's disease with
total colectomy, former smoker

EXAM:
CT ABDOMEN AND PELVIS WITHOUT CONTRAST
TECHNIQUE: Multidetector CT imaging of the abdomen and pelvis was performed
following the standard protocol without IV contrast. Sagittal and
coronal MPR images reconstructed from axial data set.

[Series 2: stone · axial · 0.66mm/px · z∈[-1154,-714]mm · 12 of 98 slices shown, 14 images]
[im 5/98  soft-tissue]
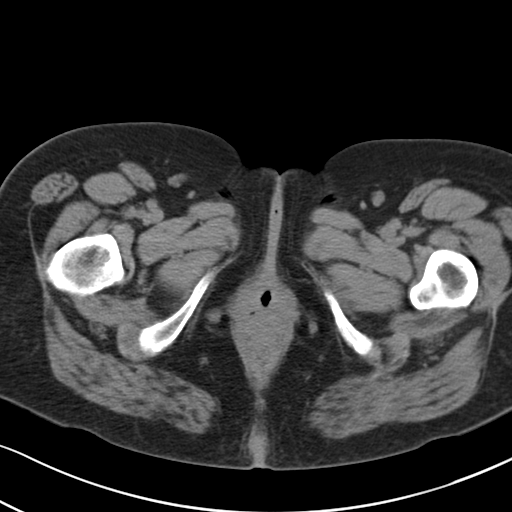
[im 5/98  bone]
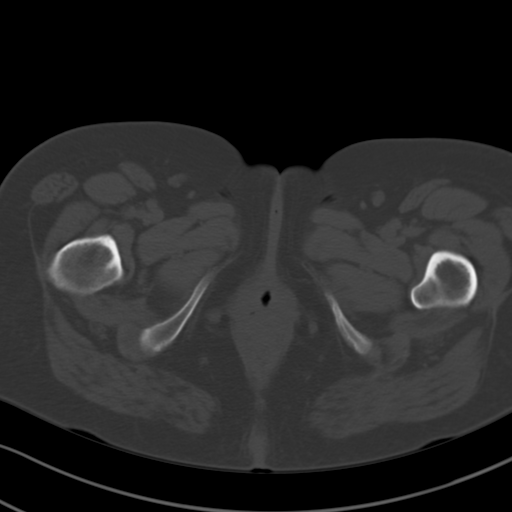
[im 14/98  soft-tissue]
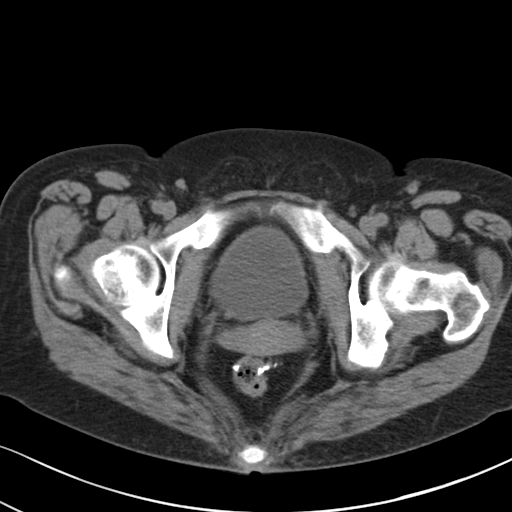
[im 23/98  soft-tissue]
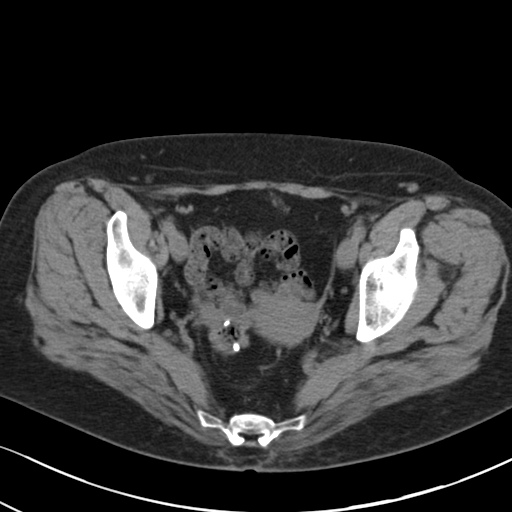
[im 31/98  soft-tissue]
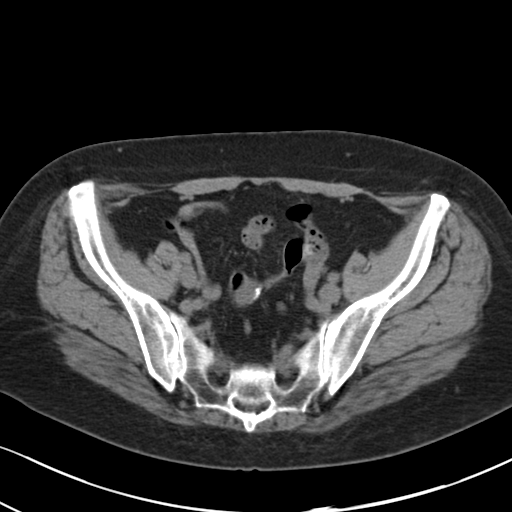
[im 36/98  soft-tissue]
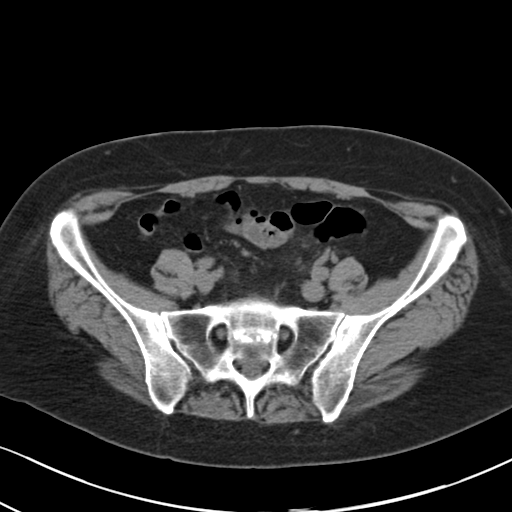
[im 45/98  soft-tissue]
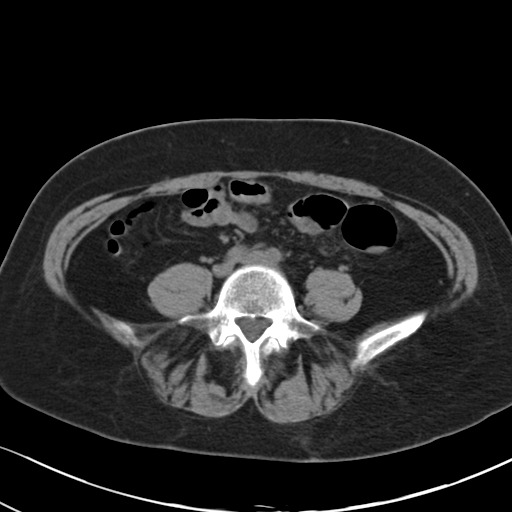
[im 53/98  soft-tissue]
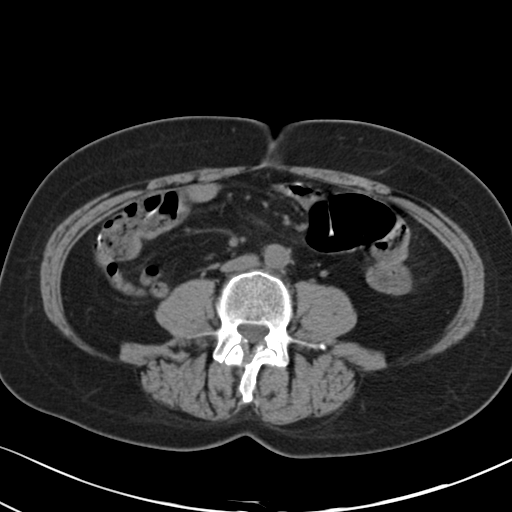
[im 62/98  soft-tissue]
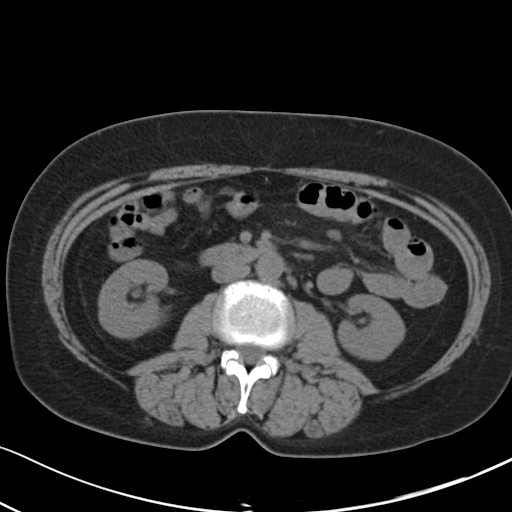
[im 67/98  soft-tissue]
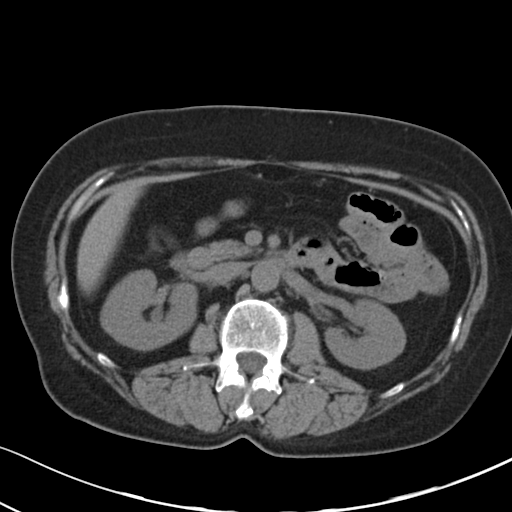
[im 67/98  bone]
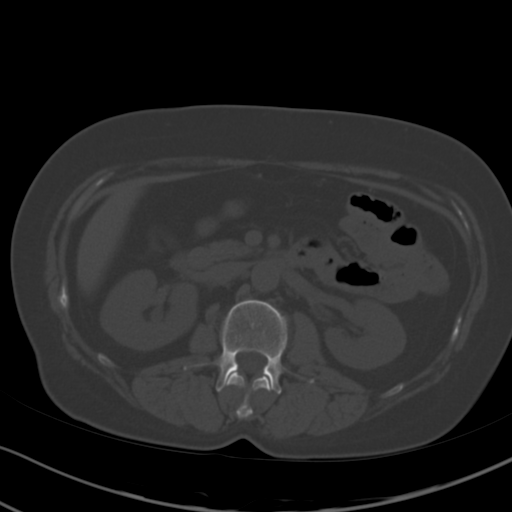
[im 75/98  soft-tissue]
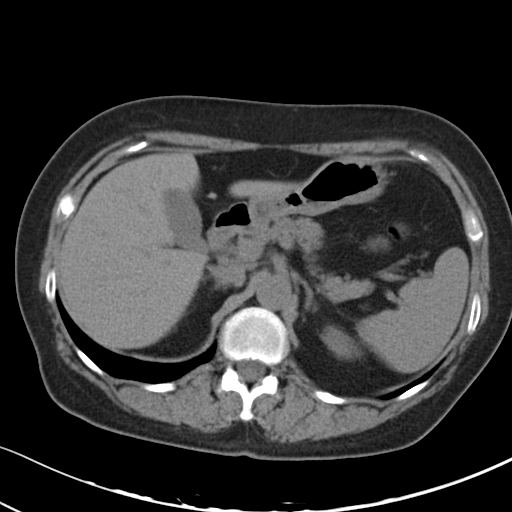
[im 84/98  soft-tissue]
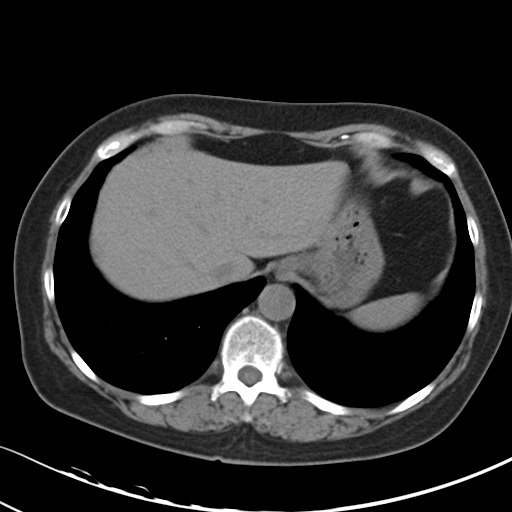
[im 93/98  soft-tissue]
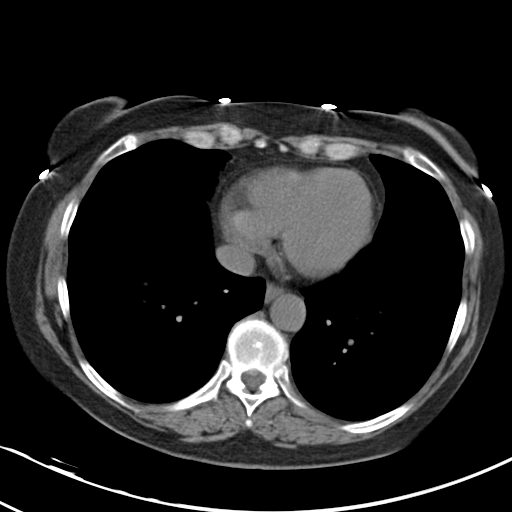

[Series 5: cor · coronal · 0.65mm/px · 3 of 129 slices shown]
[im 43/129  soft-tissue]
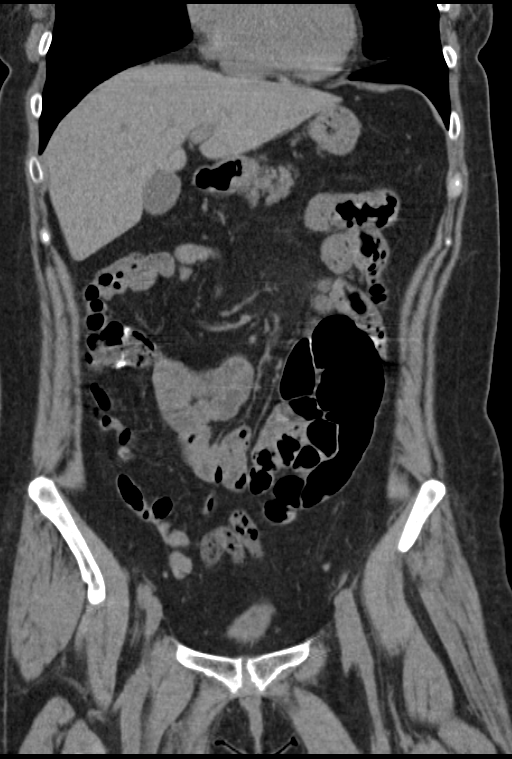
[im 57/129  soft-tissue]
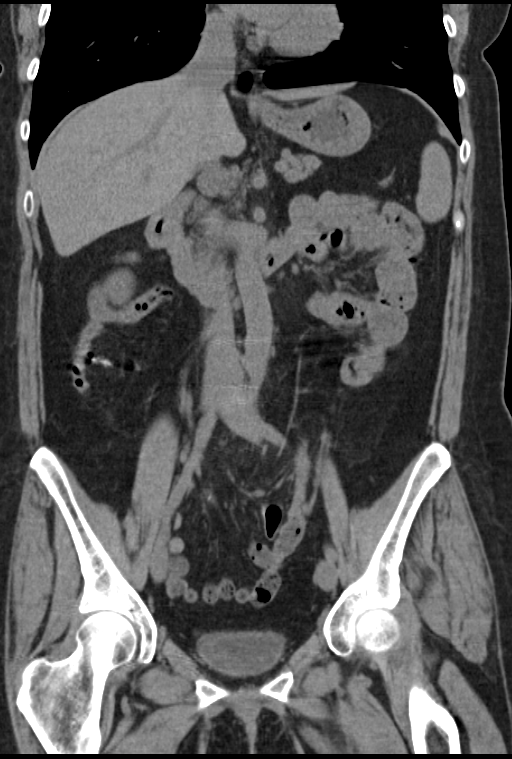
[im 72/129  soft-tissue]
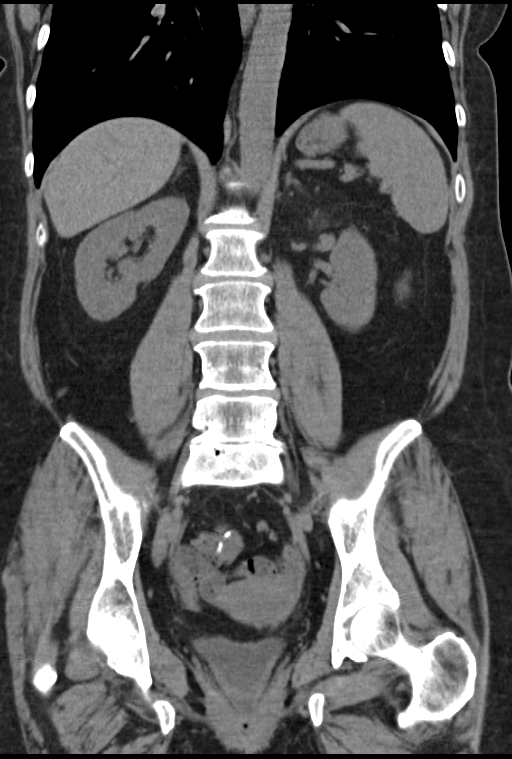

[15 of 46 positions shown; findings below may reference images not displayed]

FINDINGS: Lung bases clear.

Liver, gallbladder, spleen, pancreas, kidneys, and adrenal glands
grossly unremarkable for technique.

Specifically, no RIGHT hydronephrosis or RIGHT renal calculus are
identified.

However, newly seen is a 5 x 4 mm distal RIGHT ureteral calculus
several cm above the ureterovesical junction ; this was previously
located at the inferior pole of the RIGHT kidney.

No significant ureteral dilatation.

Prior colectomy with ileorectal versus ileoanal anastomosis.

Visualized bowel loops are unopacified and incompletely distended,
making it impossible to exclude subtle wall thickening at multiple
sites, including the distal anastomosis.

No definite areas of bowel wall thickening, dilatation, or point of
obstruction seen.

Stomach unremarkable.

Scattered normal sized para-aortic and mesenteric lymph nodes.

Minimal atherosclerotic calcification.

Unremarkable uterus and adnexa.

No mass, adenopathy, free air or free fluid.

Tiny umbilical hernia containing fat.

Osseous structures unremarkable.
IMPRESSION: Post colectomy as above.

5 x 4 mm distal RIGHT ureteral calculus several cm above the
ureterovesical junction without hydronephrosis or hydroureter.

Tiny umbilical hernia containing fat.

## 2017-08-02 DIAGNOSIS — R300 Dysuria: Secondary | ICD-10-CM | POA: Insufficient documentation

## 2017-08-02 DIAGNOSIS — N390 Urinary tract infection, site not specified: Secondary | ICD-10-CM | POA: Insufficient documentation

## 2017-08-02 DIAGNOSIS — R319 Hematuria, unspecified: Secondary | ICD-10-CM

## 2017-08-10 ENCOUNTER — Ambulatory Visit: Payer: Self-pay | Admitting: Nurse Practitioner

## 2017-08-21 ENCOUNTER — Ambulatory Visit (INDEPENDENT_AMBULATORY_CARE_PROVIDER_SITE_OTHER): Payer: 59 | Admitting: Nurse Practitioner

## 2017-08-21 ENCOUNTER — Encounter: Payer: Self-pay | Admitting: Nurse Practitioner

## 2017-08-21 VITALS — BP 139/74 | HR 70 | Resp 16 | Ht 68.5 in | Wt 184.0 lb

## 2017-08-21 DIAGNOSIS — E663 Overweight: Secondary | ICD-10-CM | POA: Diagnosis not present

## 2017-08-21 DIAGNOSIS — R102 Pelvic and perineal pain unspecified side: Secondary | ICD-10-CM

## 2017-08-21 DIAGNOSIS — N959 Unspecified menopausal and perimenopausal disorder: Secondary | ICD-10-CM | POA: Diagnosis not present

## 2017-08-21 DIAGNOSIS — F419 Anxiety disorder, unspecified: Secondary | ICD-10-CM

## 2017-08-21 MED ORDER — PHENDIMETRAZINE TARTRATE ER 105 MG PO CP24: 1.0000 | ORAL_CAPSULE | Freq: Every day | ORAL | 1 refills | Status: DC

## 2017-08-21 NOTE — Progress Notes (Signed)
[facility] [place], [place] [address]  Internal MEDICINE  Office Visit Note  Patient Name: Ruth Morales  [id number]  [id number]  Date of Service: 08/21/2017   Pt is here for routine follow up.   Chief Complaint  Patient presents with  . Weight Loss    The patient has had 3 pound weight gain since her last visit. Had to stop taking appetite suppressant because she went on antibiotics for abscessed tooth. She was afraid Bontril would interact with antibiotic, plus she had to take antibiotic with food. She has been off appetite suppressant for a few weeks now. Has root canal scheduled for this morning. Would like to start back with weight loss program as soon as she heals from this procedure.  She had transvaginal ultrasound due to hormonal treatment for menopausal symptoms. Currently uses Vivelle dot, changed every week. She uses premarin cream and progesterone tow to three times per week. She is doing well with this dosing. Ultrasound showed small amount of free fluid in endometrial cavity, but was otherwise unremarkable.       Current Medication: Outpatient Encounter Medications as of 08/21/2017  Medication Sig  . ciprofloxacin (CIPRO) 500 MG tablet Take 1 tablet (500 mg total) by mouth 2 (two) times daily.  Marland Kitchen conjugated estrogens (PREMARIN) vaginal cream Use a small 1/3 applicator at night intravaginal  . cyclobenzaprine (FLEXERIL) 10 MG tablet Take by mouth.  . estradiol (CLIMARA - DOSED IN MG/24 HR) 0.025 mg/24hr patch Place 1 patch (0.025 mg total) onto the skin once a week.  . loperamide (IMODIUM) 2 MG capsule Take 2 mg by mouth as needed for diarrhea or loose stools.  . Phendimetrazine Tartrate 105 MG CP24 Take 1 capsule (105 mg total) by mouth daily.  . progesterone (PROMETRIUM) 100 MG capsule Take 1 capsule (100 mg total) by mouth daily.  . [DISCONTINUED] Phendimetrazine Tartrate 105 MG CP24 Take 1 capsule (105 mg total) by mouth daily.   No  facility-administered encounter medications on file as of 08/21/2017.     Surgical History: Past Surgical History:  Procedure Laterality Date  . BREAST BIOPSY Left 2003   needle bx-neg  . BREAST SURGERY Bilateral 2004   breast reduction  . COLON SURGERY  2004   colon removed  . EXTRACORPOREAL SHOCK WAVE LITHOTRIPSY Right 02/26/2015   Procedure: EXTRACORPOREAL SHOCK WAVE LITHOTRIPSY (ESWL);  Surgeon: Collier Flowers, MD;  Location: [facility] ORS;  Service: Urology;  Laterality: Right;  . REDUCTION MAMMAPLASTY Bilateral 2005    Medical History: Past Medical History:  Diagnosis Date  . Anxiety 11/21/2013  . Breath shortness 11/21/2013  . Crohn disease ([place])   . Dizziness 11/21/2013  . Extensor tenosynovitis of wrist 12/24/2013  . Ganglion cyst of wrist 12/24/2013  . Hypotension, postural 11/22/2013  . Skin cancer     Family History: Family History  Problem Relation Age of Onset  . Diabetes Father   . Diabetes Mother   . Heart disease Maternal Grandmother   . Breast cancer Neg Hx     Social History   Socioeconomic History  . Marital status: Married    Spouse name: Not on file  . Number of children: Not on file  . Years of education: Not on file  . Highest education level: Not on file  Occupational History  . Not on file  Social Needs  . Financial resource strain: Not on file  . Food insecurity:    Worry: Not on file  Inability: Not on file  . Transportation needs:    Medical: Not on file    Non-medical: Not on file  Tobacco Use  . Smoking status: Former Research scientist (life sciences)  . Smokeless tobacco: Never Used  Substance and Sexual Activity  . Alcohol use: Yes    Comment: occasional drink, once a month  . Drug use: No  . Sexual activity: Yes    Partners: Male  Lifestyle  . Physical activity:    Days per week: Not on file    Minutes per session: Not on file  . Stress: Not on file  Relationships  . Social connections:    Talks on phone: Not on file    Gets together: Not on file     Attends religious service: Not on file    Active member of club or organization: Not on file    Attends meetings of clubs or organizations: Not on file    Relationship status: Not on file  . Intimate partner violence:    Fear of current or ex partner: Not on file    Emotionally abused: Not on file    Physically abused: Not on file    Forced sexual activity: Not on file  Other Topics Concern  . Not on file  Social History Narrative  . Not on file      Review of Systems  Constitutional: Positive for fatigue. Negative for activity change, chills and unexpected weight change.       Three pound weight gain since her last visit .  HENT: Negative for congestion, postnasal drip, rhinorrhea and sore throat.   Eyes: Negative.   Respiratory: Negative for cough, shortness of breath and wheezing.   Cardiovascular: Negative for chest pain and palpitations.  Gastrointestinal: Negative for abdominal pain, constipation, diarrhea, nausea and vomiting.  Endocrine: Negative for cold intolerance, heat intolerance, polydipsia, polyphagia and polyuria.  Genitourinary: Negative for dysuria, flank pain, frequency, pelvic pain and urgency.  Musculoskeletal: Negative for arthralgias, back pain, joint swelling and neck pain.  Skin: Negative for rash.  Allergic/Immunologic: Negative for environmental allergies.  Neurological: Positive for headaches. Negative for tremors and numbness.  Hematological: Negative for adenopathy.  Psychiatric/Behavioral: Negative for sleep disturbance and suicidal ideas. Behavioral problem: Depression. The patient is not nervous/anxious.     Vital Signs: BP 139/74   Pulse 70   Resp 16   Ht 5' 8.5" (1.74 m)   Wt 184 lb (83.5 kg)   SpO2 98%   BMI 27.57 kg/m    Physical Exam  Constitutional: She is oriented to person, place, and time. She appears well-developed and well-nourished.  HENT:  Head: Normocephalic and atraumatic.  Eyes: Pupils are equal, round, and  reactive to light. Conjunctivae and EOM are normal.  Neck: Normal range of motion. Neck supple. No thyromegaly present.  Cardiovascular: Normal rate, regular rhythm and normal heart sounds.  Pulmonary/Chest: Effort normal and breath sounds normal. She has no wheezes.  Abdominal: Soft. Bowel sounds are normal. There is no tenderness.  Slight suprapubic tenderness present.   Musculoskeletal: Normal range of motion.  Lymphadenopathy:    She has no cervical adenopathy.  Neurological: She is alert and oriented to person, place, and time. No cranial nerve deficit.  Skin: Skin is warm and dry.  Psychiatric: She has a normal mood and affect. Her behavior is normal. Judgment and thought content normal.  Nursing note and vitals reviewed.  Assessment/Plan:  1. Overweight Will try additional month on appetite suppressant. Recommend 1500 calorie diet and  increased exercise. Will take "holiday" from appetite suppressant if no progress at next visit.  - Phendimetrazine Tartrate 105 MG CP24; Take 1 capsule (105 mg total) by mouth daily.  Dispense: 30 capsule; Refill: 1  2. Pelvic pain Reviewed u/s results with the patient. Small amount free fluid in endometrial cavity, but otherwise unremarkable. Will monitor.   3. Menopausal and perimenopausal disorder Continue hormone supplements as prescribed.   General Counseling: Dominica verbalizes understanding of the findings of todays visit and agrees with plan of treatment. I have discussed any further diagnostic evaluation that may be needed or ordered today. We also reviewed her medications today. she has been encouraged to call the office with any questions or concerns that should arise related to todays visit.    There is a liability release in patients' chart. There has been a 10 minute discussion about the side effects including but not limited to elevated blood pressure, anxiety, lack of sleep and dry mouth. Pt understands and will like to start/continue  on appetite suppressant at this time. There will be one month RX given at the time of visit with proper follow up. Nova diet plan with restricted calories is given to the pt. Pt understands and agrees with  plan of treatment  This patient was seen by Leretha Pol, FNP- C in Collaboration with Dr Lavera Guise as a part of collaborative care agreement  Meds ordered this encounter  Medications  . Phendimetrazine Tartrate 105 MG CP24    Sig: Take 1 capsule (105 mg total) by mouth daily.    Dispense:  30 capsule    Refill:  1    Order Specific Question:   Supervising Provider    Answer:   Lavera Guise [[id number]]    Time spent:15 Minutes      Dr Lavera Guise Internal medicine

## 2017-09-18 ENCOUNTER — Other Ambulatory Visit: Payer: Self-pay | Admitting: Internal Medicine

## 2017-09-18 DIAGNOSIS — N952 Postmenopausal atrophic vaginitis: Secondary | ICD-10-CM

## 2017-10-03 ENCOUNTER — Ambulatory Visit: Payer: 59 | Admitting: Nurse Practitioner

## 2017-10-03 ENCOUNTER — Encounter: Payer: Self-pay | Admitting: Nurse Practitioner

## 2017-10-03 VITALS — BP 139/78 | HR 65 | Resp 16 | Ht 69.0 in | Wt 184.0 lb

## 2017-10-03 DIAGNOSIS — E663 Overweight: Secondary | ICD-10-CM

## 2017-10-03 DIAGNOSIS — N959 Unspecified menopausal and perimenopausal disorder: Secondary | ICD-10-CM | POA: Diagnosis not present

## 2017-10-03 MED ORDER — PHENTERMINE HCL 37.5 MG PO TABS: 37.5000 mg | ORAL_TABLET | Freq: Every day | ORAL | 1 refills | Status: DC

## 2017-10-03 NOTE — Progress Notes (Signed)
[facility] [place], [place] [address]  Internal MEDICINE  Office Visit Note  Patient Name: Ruth Morales  [id number]  [id number]  Date of Service: 10/25/2017   Pt is here for routine follow up.   Chief Complaint  Patient presents with  . weight management    6wk follow up    The patient has not had weight gain or weight loss since her last visit. Still feels as though this is working, but maybe not as well as it was working when she first started.        Current Medication: Outpatient Encounter Medications as of 10/03/2017  Medication Sig  . cyclobenzaprine (FLEXERIL) 10 MG tablet Take by mouth.  . estradiol (CLIMARA - DOSED IN MG/24 HR) 0.025 mg/24hr patch PLACE 1 PATCH (0.025 MG TOTAL) ONTO THE SKIN ONCE A WEEK.  Marland Kitchen loperamide (IMODIUM) 2 MG capsule Take 2 mg by mouth as needed for diarrhea or loose stools.  . progesterone (PROMETRIUM) 100 MG capsule Take 1 capsule (100 mg total) by mouth daily.  . [DISCONTINUED] ciprofloxacin (CIPRO) 500 MG tablet Take 1 tablet (500 mg total) by mouth 2 (two) times daily.  . [DISCONTINUED] conjugated estrogens (PREMARIN) vaginal cream Use a small 1/3 applicator at night intravaginal  . [DISCONTINUED] Phendimetrazine Tartrate 105 MG CP24 Take 1 capsule (105 mg total) by mouth daily.  . phentermine (ADIPEX-P) 37.5 MG tablet Take 1 tablet (37.5 mg total) by mouth daily before breakfast.   No facility-administered encounter medications on file as of 10/03/2017.     Surgical History: Past Surgical History:  Procedure Laterality Date  . BREAST BIOPSY Left 2003   needle bx-neg  . BREAST SURGERY Bilateral 2004   breast reduction  . COLON SURGERY  2004   colon removed  . EXTRACORPOREAL SHOCK WAVE LITHOTRIPSY Right 02/26/2015   Procedure: EXTRACORPOREAL SHOCK WAVE LITHOTRIPSY (ESWL);  Surgeon: Collier Flowers, MD;  Location: [facility] ORS;  Service: Urology;  Laterality: Right;  . REDUCTION MAMMAPLASTY Bilateral 2005     Medical History: Past Medical History:  Diagnosis Date  . Anxiety 11/21/2013  . Breath shortness 11/21/2013  . Crohn disease ([place])   . Dizziness 11/21/2013  . Extensor tenosynovitis of wrist 12/24/2013  . Ganglion cyst of wrist 12/24/2013  . Hypotension, postural 11/22/2013  . Skin cancer     Family History: Family History  Problem Relation Age of Onset  . Diabetes Father   . Diabetes Mother   . Heart disease Maternal Grandmother   . Breast cancer Neg Hx     Social History   Socioeconomic History  . Marital status: Married    Spouse name: Not on file  . Number of children: Not on file  . Years of education: Not on file  . Highest education level: Not on file  Occupational History  . Not on file  Social Needs  . Financial resource strain: Not on file  . Food insecurity:    Worry: Not on file    Inability: Not on file  . Transportation needs:    Medical: Not on file    Non-medical: Not on file  Tobacco Use  . Smoking status: Former Research scientist (life sciences)  . Smokeless tobacco: Never Used  Substance and Sexual Activity  . Alcohol use: Yes    Comment: occasional drink, once a month  . Drug use: No  . Sexual activity: Yes    Partners: Male  Lifestyle  . Physical activity:    Days per week:  Not on file    Minutes per session: Not on file  . Stress: Not on file  Relationships  . Social connections:    Talks on phone: Not on file    Gets together: Not on file    Attends religious service: Not on file    Active member of club or organization: Not on file    Attends meetings of clubs or organizations: Not on file    Relationship status: Not on file  . Intimate partner violence:    Fear of current or ex partner: Not on file    Emotionally abused: Not on file    Physically abused: Not on file    Forced sexual activity: Not on file  Other Topics Concern  . Not on file  Social History Narrative  . Not on file      Review of Systems  Constitutional: Negative for activity  change, chills, fatigue and unexpected weight change.       Three pound weight gain since her last visit .  HENT: Negative for congestion, postnasal drip, rhinorrhea and sore throat.   Eyes: Negative.   Respiratory: Negative for cough, shortness of breath and wheezing.   Cardiovascular: Negative for chest pain and palpitations.  Gastrointestinal: Negative for abdominal pain, constipation, diarrhea, nausea and vomiting.  Endocrine: Negative for cold intolerance, heat intolerance, polydipsia, polyphagia and polyuria.  Genitourinary: Negative for dysuria, flank pain, frequency, pelvic pain and urgency.  Musculoskeletal: Negative for arthralgias, back pain, joint swelling and neck pain.  Skin: Negative for rash.  Allergic/Immunologic: Negative for environmental allergies.  Neurological: Positive for headaches. Negative for tremors and numbness.  Hematological: Negative for adenopathy.  Psychiatric/Behavioral: Negative for sleep disturbance and suicidal ideas. Behavioral problem: Depression. The patient is not nervous/anxious.     Today's Vitals   10/03/17 0947  BP: 139/78  Pulse: 65  Resp: 16  SpO2: 94%  Weight: 184 lb (83.5 kg)  Height: 5\' 9"  (1.753 m)    Physical Exam  Constitutional: She is oriented to person, place, and time. She appears well-developed and well-nourished.  HENT:  Head: Normocephalic and atraumatic.  Nose: Nose normal.  Eyes: Pupils are equal, round, and reactive to light. Conjunctivae and EOM are normal.  Neck: Normal range of motion. Neck supple. No JVD present. No tracheal deviation present. No thyromegaly present.  Cardiovascular: Normal rate, regular rhythm and normal heart sounds.  Pulmonary/Chest: Effort normal and breath sounds normal. She has no wheezes.  Abdominal: Soft. Bowel sounds are normal. There is no tenderness.  Slight suprapubic tenderness present.   Musculoskeletal: Normal range of motion.  Lymphadenopathy:    She has no cervical adenopathy.   Neurological: She is alert and oriented to person, place, and time. No cranial nerve deficit.  Skin: Skin is warm and dry.  Psychiatric: She has a normal mood and affect. Her behavior is normal. Judgment and thought content normal.  Nursing note and vitals reviewed.   Assessment/Plan: 1. Menopausal and perimenopausal disorder Continue with hormonal supplementation as prescribed.   2. Overweight Change bontril to phentermine 37.5mg  tablets. Limit calorie intake to 1200-1500 calories per day. Stressed importance of regular exercise to help with weight loss.  - phentermine (ADIPEX-P) 37.5 MG tablet; Take 1 tablet (37.5 mg total) by mouth daily before breakfast.  Dispense: 30 tablet; Refill: 1  General Counseling: Kailany verbalizes understanding of the findings of todays visit and agrees with plan of treatment. I have discussed any further diagnostic evaluation that may be needed or  ordered today. We also reviewed her medications today. she has been encouraged to call the office with any questions or concerns that should arise related to todays visit.    Counseling:  Obesity Counseling: Risk Assessment: An assessment of behavioral risk factors was made today and includes lack of exercise sedentary lifestyle, lack of portion control and poor dietary habits.  Risk Modification Advice: She was counseled on portion control guidelines. Restricting daily caloric intake to 1200-1500 calories per day.   This patient was seen by [place] with Dr Lavera Guise as a part of collaborative care agreement   Meds ordered this encounter  Medications  . phentermine (ADIPEX-P) 37.5 MG tablet    Sig: Take 1 tablet (37.5 mg total) by mouth daily before breakfast.    Dispense:  30 tablet    Refill:  1    Order Specific Question:   Supervising Provider    Answer:   Lavera Guise [[id number]]    Time spent: 73 Minutes       Dr Lavera Guise Internal medicine

## 2017-10-23 ENCOUNTER — Other Ambulatory Visit: Payer: Self-pay

## 2017-11-20 ENCOUNTER — Encounter: Payer: Self-pay | Admitting: Adult Health

## 2017-11-20 ENCOUNTER — Ambulatory Visit (INDEPENDENT_AMBULATORY_CARE_PROVIDER_SITE_OTHER): Payer: 59 | Admitting: Adult Health

## 2017-11-20 VITALS — BP 140/78 | HR 67 | Resp 16 | Ht 69.0 in | Wt 183.0 lb

## 2017-11-20 DIAGNOSIS — R030 Elevated blood-pressure reading, without diagnosis of hypertension: Secondary | ICD-10-CM | POA: Diagnosis not present

## 2017-11-20 DIAGNOSIS — E663 Overweight: Secondary | ICD-10-CM | POA: Diagnosis not present

## 2017-11-20 DIAGNOSIS — F411 Generalized anxiety disorder: Secondary | ICD-10-CM

## 2017-11-20 MED ORDER — PHENTERMINE HCL 37.5 MG PO TABS: 37.5000 mg | ORAL_TABLET | Freq: Every day | ORAL | 0 refills | Status: DC

## 2017-11-20 NOTE — Progress Notes (Signed)
[facility] [place], [place] [address]  Internal MEDICINE  Office Visit Note  Patient Name: Ruth Morales  [id number]  [id number]  Date of Service: 11/29/2017  Chief Complaint  Patient presents with  . Medical Management of Chronic Issues    weight loss     HPI Pt here for follow up on weight loss.  She has lost 1 pound since last visit, and states she feels like she lost more weight than this.  She reports her clothes fit different and she feels great.  She denies chest pain, palpitations, SOB or other side effects.   Current Medication: Outpatient Encounter Medications as of 11/20/2017  Medication Sig  . cyclobenzaprine (FLEXERIL) 10 MG tablet Take by mouth.  . estradiol (CLIMARA - DOSED IN MG/24 HR) 0.025 mg/24hr patch PLACE 1 PATCH (0.025 MG TOTAL) ONTO THE SKIN ONCE A WEEK.  Marland Kitchen loperamide (IMODIUM) 2 MG capsule Take 2 mg by mouth as needed for diarrhea or loose stools.  . phentermine (ADIPEX-P) 37.5 MG tablet Take 1 tablet (37.5 mg total) by mouth daily before breakfast.  . progesterone (PROMETRIUM) 100 MG capsule Take 1 capsule (100 mg total) by mouth daily.  . [DISCONTINUED] phentermine (ADIPEX-P) 37.5 MG tablet Take 1 tablet (37.5 mg total) by mouth daily before breakfast.   No facility-administered encounter medications on file as of 11/20/2017.     Surgical History: Past Surgical History:  Procedure Laterality Date  . BREAST BIOPSY Left 2003   needle bx-neg  . BREAST SURGERY Bilateral 2004   breast reduction  . COLON SURGERY  2004   colon removed  . EXTRACORPOREAL SHOCK WAVE LITHOTRIPSY Right 02/26/2015   Procedure: EXTRACORPOREAL SHOCK WAVE LITHOTRIPSY (ESWL);  Surgeon: Collier Flowers, MD;  Location: [facility] ORS;  Service: Urology;  Laterality: Right;  . REDUCTION MAMMAPLASTY Bilateral 2005    Medical History: Past Medical History:  Diagnosis Date  . Anxiety 11/21/2013  . Breath shortness 11/21/2013  . Crohn disease ([place])   . Dizziness  11/21/2013  . Extensor tenosynovitis of wrist 12/24/2013  . Ganglion cyst of wrist 12/24/2013  . Hypotension, postural 11/22/2013  . Skin cancer     Family History: Family History  Problem Relation Age of Onset  . Diabetes Father   . Diabetes Mother   . Heart disease Maternal Grandmother   . Breast cancer Neg Hx     Social History   Socioeconomic History  . Marital status: Married    Spouse name: Not on file  . Number of children: Not on file  . Years of education: Not on file  . Highest education level: Not on file  Occupational History  . Not on file  Social Needs  . Financial resource strain: Not on file  . Food insecurity:    Worry: Not on file    Inability: Not on file  . Transportation needs:    Medical: Not on file    Non-medical: Not on file  Tobacco Use  . Smoking status: Former Research scientist (life sciences)  . Smokeless tobacco: Never Used  Substance and Sexual Activity  . Alcohol use: Yes    Comment: occasional drink, once a month  . Drug use: No  . Sexual activity: Yes    Partners: Male  Lifestyle  . Physical activity:    Days per week: Not on file    Minutes per session: Not on file  . Stress: Not on file  Relationships  . Social connections:    Talks on  phone: Not on file    Gets together: Not on file    Attends religious service: Not on file    Active member of club or organization: Not on file    Attends meetings of clubs or organizations: Not on file    Relationship status: Not on file  . Intimate partner violence:    Fear of current or ex partner: Not on file    Emotionally abused: Not on file    Physically abused: Not on file    Forced sexual activity: Not on file  Other Topics Concern  . Not on file  Social History Narrative  . Not on file      Review of Systems  Constitutional: Negative for chills, fatigue and unexpected weight change.  HENT: Negative for congestion, rhinorrhea, sneezing and sore throat.   Eyes: Negative for photophobia, pain and  redness.  Respiratory: Negative for cough, chest tightness and shortness of breath.   Cardiovascular: Negative for chest pain and palpitations.  Gastrointestinal: Negative for abdominal pain, constipation, diarrhea, nausea and vomiting.  Endocrine: Negative.   Genitourinary: Negative for dysuria and frequency.  Musculoskeletal: Negative for arthralgias, back pain, joint swelling and neck pain.  Skin: Negative for rash.  Allergic/Immunologic: Negative.   Neurological: Negative for tremors and numbness.  Hematological: Negative for adenopathy. Does not bruise/bleed easily.  Psychiatric/Behavioral: Negative for behavioral problems and sleep disturbance. The patient is not nervous/anxious.     Vital Signs: BP 140/78   Pulse 67   Resp 16   Ht 5\' 9"  (1.753 m)   Wt 183 lb (83 kg)   SpO2 96%   BMI 27.02 kg/m    Physical Exam  Constitutional: She is oriented to person, place, and time. She appears well-developed and well-nourished. No distress.  HENT:  Head: Normocephalic and atraumatic.  Mouth/Throat: Oropharynx is clear and moist. No oropharyngeal exudate.  Eyes: Pupils are equal, round, and reactive to light. EOM are normal.  Neck: Normal range of motion. Neck supple. No JVD present. No tracheal deviation present. No thyromegaly present.  Cardiovascular: Normal rate, regular rhythm and normal heart sounds. Exam reveals no gallop and no friction rub.  No murmur heard. Pulmonary/Chest: Effort normal and breath sounds normal. No respiratory distress. She has no wheezes. She has no rales. She exhibits no tenderness.  Abdominal: Soft. There is no tenderness. There is no guarding.  Musculoskeletal: Normal range of motion.  Lymphadenopathy:    She has no cervical adenopathy.  Neurological: She is alert and oriented to person, place, and time. No cranial nerve deficit.  Skin: Skin is warm and dry. She is not diaphoretic.  Psychiatric: She has a normal mood and affect. Her behavior is  normal. Judgment and thought content normal.  Nursing note and vitals reviewed.   Assessment/Plan: 1. Overweight - phentermine (ADIPEX-P) 37.5 MG tablet; Take 1 tablet (37.5 mg total) by mouth daily before breakfast.  Dispense: 30 tablet; Refill: 0  Obesity Counseling: Risk Assessment: An assessment of behavioral risk factors was made today and includes lack of exercise sedentary lifestyle, lack of portion control and poor dietary habits.  Risk Modification Advice: She was counseled on portion control guidelines. Restricting daily caloric intake to. . The detrimental long term effects of obesity on her health and ongoing poor compliance was also discussed with the patient. 15 min    2. Elevated BP without diagnosis of hypertension BP slightly elevated today, better than previous.  Continue to monitor.   3. Anxiety Doing well, denies  exacerbations, or issues.   General Counseling: Sherronda verbalizes understanding of the findings of todays visit and agrees with plan of treatment. I have discussed any further diagnostic evaluation that may be needed or ordered today. We also reviewed her medications today. she has been encouraged to call the office with any questions or concerns that should arise related to todays visit.   Meds ordered this encounter  Medications  . phentermine (ADIPEX-P) 37.5 MG tablet    Sig: Take 1 tablet (37.5 mg total) by mouth daily before breakfast.    Dispense:  30 tablet    Refill:  0    Time spent: 25 Minutes   This patient was seen by Orson Gear AGNP-C in Collaboration with Dr Lavera Guise as a part of collaborative care agreement    Dr Lavera Guise Internal medicine

## 2017-11-20 NOTE — Patient Instructions (Signed)
Exercising to Lose Weight Exercising can help you to lose weight. In order to lose weight through exercise, you need to do vigorous-intensity exercise. You can tell that you are exercising with vigorous intensity if you are breathing very hard and fast and cannot hold a conversation while exercising. Moderate-intensity exercise helps to maintain your current weight. You can tell that you are exercising at a moderate level if you have a higher heart rate and faster breathing, but you are still able to hold a conversation. How often should I exercise? Choose an activity that you enjoy and set realistic goals. Your health care provider can help you to make an activity plan that works for you. Exercise regularly as directed by your health care provider. This may include:  Doing resistance training twice each week, such as: ? Push-ups. ? Sit-ups. ? Lifting weights. ? Using resistance bands.  Doing a given intensity of exercise for a given amount of time. Choose from these options: ? 150 minutes of moderate-intensity exercise every week. ? 75 minutes of vigorous-intensity exercise every week. ? A mix of moderate-intensity and vigorous-intensity exercise every week.  Children, pregnant women, people who are out of shape, people who are overweight, and older adults may need to consult a health care provider for individual recommendations. If you have any sort of medical condition, be sure to consult your health care provider before starting a new exercise program. What are some activities that can help me to lose weight?  Walking at a rate of at least 4.5 miles an hour.  Jogging or running at a rate of 5 miles per hour.  Biking at a rate of at least 10 miles per hour.  Lap swimming.  Roller-skating or in-line skating.  Cross-country skiing.  Vigorous competitive sports, such as football, basketball, and soccer.  Jumping rope.  Aerobic dancing. How can I be more active in my day-to-day  activities?  Use the stairs instead of the elevator.  Take a walk during your lunch break.  If you drive, park your car farther away from work or school.  If you take public transportation, get off one stop early and walk the rest of the way.  Make all of your phone calls while standing up and walking around.  Get up, stretch, and walk around every 30 minutes throughout the day. What guidelines should I follow while exercising?  Do not exercise so much that you hurt yourself, feel dizzy, or get very short of breath.  Consult your health care provider prior to starting a new exercise program.  Wear comfortable clothes and shoes with good support.  Drink plenty of water while you exercise to prevent dehydration or heat stroke. Body water is lost during exercise and must be replaced.  Work out until you breathe faster and your heart beats faster. This information is not intended to replace advice given to you by your health care provider. Make sure you discuss any questions you have with your health care provider. Document Released: 04/30/2010 Document Revised: 09/03/2015 Document Reviewed: 08/29/2013 [employer] Interactive Patient Education  2018 [employer]  

## 2017-11-29 DIAGNOSIS — R21 Rash and other nonspecific skin eruption: Secondary | ICD-10-CM | POA: Diagnosis not present

## 2017-11-29 DIAGNOSIS — L308 Other specified dermatitis: Secondary | ICD-10-CM | POA: Diagnosis not present

## 2017-11-29 DIAGNOSIS — L400 Psoriasis vulgaris: Secondary | ICD-10-CM | POA: Diagnosis not present

## 2017-11-30 DIAGNOSIS — H25042 Posterior subcapsular polar age-related cataract, left eye: Secondary | ICD-10-CM | POA: Diagnosis not present

## 2017-12-22 ENCOUNTER — Ambulatory Visit: Payer: 59 | Admitting: Nurse Practitioner

## 2017-12-22 ENCOUNTER — Other Ambulatory Visit: Payer: Self-pay | Admitting: Nurse Practitioner

## 2017-12-22 ENCOUNTER — Encounter: Payer: Self-pay | Admitting: Nurse Practitioner

## 2017-12-22 VITALS — BP 144/80 | HR 66 | Resp 16 | Ht 68.5 in | Wt 178.6 lb

## 2017-12-22 DIAGNOSIS — Z124 Encounter for screening for malignant neoplasm of cervix: Secondary | ICD-10-CM

## 2017-12-22 DIAGNOSIS — R300 Dysuria: Secondary | ICD-10-CM | POA: Diagnosis not present

## 2017-12-22 DIAGNOSIS — E663 Overweight: Secondary | ICD-10-CM

## 2017-12-22 DIAGNOSIS — E782 Mixed hyperlipidemia: Secondary | ICD-10-CM

## 2017-12-22 DIAGNOSIS — Z1231 Encounter for screening mammogram for malignant neoplasm of breast: Secondary | ICD-10-CM

## 2017-12-22 DIAGNOSIS — Z8719 Personal history of other diseases of the digestive system: Secondary | ICD-10-CM

## 2017-12-22 DIAGNOSIS — N390 Urinary tract infection, site not specified: Secondary | ICD-10-CM

## 2017-12-22 DIAGNOSIS — L409 Psoriasis, unspecified: Secondary | ICD-10-CM

## 2017-12-22 DIAGNOSIS — Z0001 Encounter for general adult medical examination with abnormal findings: Secondary | ICD-10-CM | POA: Diagnosis not present

## 2017-12-22 DIAGNOSIS — Z1239 Encounter for other screening for malignant neoplasm of breast: Secondary | ICD-10-CM

## 2017-12-22 DIAGNOSIS — E2839 Other primary ovarian failure: Secondary | ICD-10-CM

## 2017-12-22 DIAGNOSIS — E559 Vitamin D deficiency, unspecified: Secondary | ICD-10-CM

## 2017-12-22 MED ORDER — PHENTERMINE HCL 37.5 MG PO TABS: 37.5000 mg | ORAL_TABLET | Freq: Every day | ORAL | 0 refills | Status: DC

## 2017-12-22 NOTE — Progress Notes (Signed)
[facility] [place], [place] [address]  Internal MEDICINE  Office Visit Note  Patient Name: Ruth Morales  [id number]  [id number]  Date of Service: 12/25/2017   Pt is here for routine health maintenance examination  Chief Complaint  Patient presents with  . Annual Exam  . Gynecologic Exam  . Medical Management of Chronic Issues    weight management     The patient was recently diagnosed with psoriasis per her dermatologist. Has this in several areas. Is thinking the dermatologist might want to start her on biologic. She has prior history of ulcerative colitis. Had colon removed many years ago and has not had issues with this or with psoriasis since that this surgery. This was march, 2004.  The patient has had weight loss of 5 pounds since her last visit. Continues to take phentermine to help with weight loss. She has not had negative side effects from this. Has increased her physical activity  A great deal which has helped her lose weight.     Current Medication: Outpatient Encounter Medications as of 12/22/2017  Medication Sig  . estradiol (CLIMARA - DOSED IN MG/24 HR) 0.025 mg/24hr patch PLACE 1 PATCH (0.025 MG TOTAL) ONTO THE SKIN ONCE A WEEK.  Marland Kitchen loperamide (IMODIUM) 2 MG capsule Take 2 mg by mouth as needed for diarrhea or loose stools.  . phentermine (ADIPEX-P) 37.5 MG tablet Take 1 tablet (37.5 mg total) by mouth daily before breakfast.  . progesterone (PROMETRIUM) 100 MG capsule Take 1 capsule (100 mg total) by mouth daily.  . [DISCONTINUED] phentermine (ADIPEX-P) 37.5 MG tablet Take 1 tablet (37.5 mg total) by mouth daily before breakfast.  . cyclobenzaprine (FLEXERIL) 10 MG tablet Take by mouth.   No facility-administered encounter medications on file as of 12/22/2017.     Surgical History: Past Surgical History:  Procedure Laterality Date  . BREAST BIOPSY Left 2003   needle bx-neg  . BREAST SURGERY Bilateral 2004   breast reduction  .  COLON SURGERY  2004   colon removed  . EXTRACORPOREAL SHOCK WAVE LITHOTRIPSY Right 02/26/2015   Procedure: EXTRACORPOREAL SHOCK WAVE LITHOTRIPSY (ESWL);  Surgeon: Collier Flowers, MD;  Location: [facility] ORS;  Service: Urology;  Laterality: Right;  . REDUCTION MAMMAPLASTY Bilateral 2005    Medical History: Past Medical History:  Diagnosis Date  . Anxiety 11/21/2013  . Breath shortness 11/21/2013  . Crohn disease ([place])   . Dizziness 11/21/2013  . Extensor tenosynovitis of wrist 12/24/2013  . Ganglion cyst of wrist 12/24/2013  . Hypotension, postural 11/22/2013  . Psoriasis   . Skin cancer     Family History: Family History  Problem Relation Age of Onset  . Diabetes Father   . Diabetes Mother   . Heart disease Maternal Grandmother   . Breast cancer Neg Hx       Review of Systems  Constitutional: Negative for activity change, chills, fatigue and unexpected weight change.       Five pound weight loss since last visit.   HENT: Negative for congestion, postnasal drip, rhinorrhea and sore throat.   Eyes: Negative.   Respiratory: Negative for cough, shortness of breath and wheezing.   Cardiovascular: Negative for chest pain and palpitations.  Gastrointestinal: Negative for abdominal pain, constipation, diarrhea, nausea and vomiting.  Endocrine: Negative for cold intolerance, heat intolerance, polydipsia, polyphagia and polyuria.  Genitourinary: Negative for dysuria, flank pain, frequency, pelvic pain and urgency.  Musculoskeletal: Negative for arthralgias, back pain, joint swelling and  neck pain.  Skin: Negative for rash.  Allergic/Immunologic: Negative for environmental allergies.  Neurological: Positive for headaches. Negative for tremors and numbness.  Hematological: Negative for adenopathy.  Psychiatric/Behavioral: Negative for sleep disturbance and suicidal ideas. Behavioral problem: Depression. The patient is not nervous/anxious.      Today's Vitals   12/22/17 0851  BP: (!)  144/80  Pulse: 66  Resp: 16  SpO2: 99%  Weight: 178 lb 9.6 oz (81 kg)  Height: 5' 8.5" (1.74 m)    Physical Exam  Constitutional: She is oriented to person, place, and time. She appears well-developed and well-nourished. No distress.  HENT:  Head: Normocephalic and atraumatic.  Nose: Nose normal.  Mouth/Throat: Oropharynx is clear and moist. No oropharyngeal exudate.  Eyes: Pupils are equal, round, and reactive to light. Conjunctivae and EOM are normal.  Neck: Normal range of motion. Neck supple. No JVD present. Carotid bruit is not present. No tracheal deviation present. No thyromegaly present.  Cardiovascular: Normal rate, regular rhythm, normal heart sounds and intact distal pulses. Exam reveals no gallop and no friction rub.  No murmur heard. Pulmonary/Chest: Effort normal and breath sounds normal. No respiratory distress. She has no wheezes. She has no rales. She exhibits no tenderness. Right breast exhibits no inverted nipple, no mass, no nipple discharge, no skin change and no tenderness. Left breast exhibits no inverted nipple, no mass, no nipple discharge, no skin change and no tenderness.  Abdominal: Soft. Bowel sounds are normal. There is no tenderness. There is no guarding.  Genitourinary: Vagina normal and uterus normal.  Genitourinary Comments: No tenderness, masses, or organomeglay present during bimanual exam .  Musculoskeletal: Normal range of motion.  Lymphadenopathy:    She has no cervical adenopathy.  Neurological: She is alert and oriented to person, place, and time. No cranial nerve deficit.  Skin: Skin is warm and dry. She is not diaphoretic.  Psychiatric: She has a normal mood and affect. Her behavior is normal. Judgment and thought content normal.  Nursing note and vitals reviewed.    LABS: Recent Results (from the past 2160 hour(s))  UA/M w/rflx Culture, Routine     Status: Abnormal   Collection Time: 12/22/17  8:59 AM  Result Value Ref Range   Specific  Gravity, UA 1.020 1.005 - 1.030   pH, UA 5.0 5.0 - 7.5   Color, UA Yellow Yellow   Appearance Ur Clear Clear   Leukocytes, UA 2+ (A) Negative   Protein, UA Negative Negative/Trace   Glucose, UA Negative Negative   Ketones, UA Negative Negative   RBC, UA Negative Negative   Bilirubin, UA Negative Negative   Urobilinogen, Ur 0.2 0.2 - 1.0 mg/dL   Nitrite, UA Negative Negative   Microscopic Examination See below:     Comment: Microscopic was indicated and was performed.   Urinalysis Reflex Comment     Comment: This specimen has reflexed to a Urine Culture.  Microscopic Examination     Status: Abnormal   Collection Time: 12/22/17  8:59 AM  Result Value Ref Range   WBC, UA 11-30 (A) 0 - 5 /hpf   RBC, UA 0-2 0 - 2 /hpf   Epithelial Cells (non renal) 0-10 0 - 10 /hpf   Casts Present (A) None seen /lpf   Cast Type Hyaline casts N/A   Mucus, UA Present Not Estab.   Bacteria, UA Few None seen/Few  Urine Culture, Reflex     Status: Abnormal   Collection Time: 12/22/17  8:59 AM  Result Value Ref Range   Urine Culture, Routine Final report (A)    Organism ID, Bacteria Klebsiella pneumoniae (A)     Comment: 50,000-100,000 colony forming units per mL Cefazolin <=4 ug/mL Cefazolin with an MIC <=16 predicts susceptibility to the oral agents cefaclor, cefdinir, cefpodoxime, cefprozil, cefuroxime, cephalexin, and loracarbef when used for therapy of uncomplicated urinary tract infections due to E. coli, Klebsiella pneumoniae, and Proteus mirabilis.    Antimicrobial Susceptibility Comment     Comment:       ** S = Susceptible; I = Intermediate; R = Resistant **                    P = Positive; N = Negative             MICS are expressed in micrograms per mL    Antibiotic                 RSLT#1    RSLT#2    RSLT#3    RSLT#4 Amoxicillin/Clavulanic Acid    S Ampicillin                     R Cefepime                       S Ceftriaxone                    S Cefuroxime                      S Ciprofloxacin                  S Ertapenem                      S Gentamicin                     S Imipenem                       S Levofloxacin                   S Meropenem                      S Nitrofurantoin                 S Piperacillin/Tazobactam        S Tetracycline                   S Tobramycin                     S Trimethoprim/Sulfa             S   CBC with Differential/Platelet     Status: None   Collection Time: 12/22/17 10:14 AM  Result Value Ref Range   WBC 5.3 3.4 - 10.8 x10E3/uL   RBC 4.30 3.77 - 5.28 x10E6/uL   Hemoglobin 12.8 11.1 - 15.9 g/dL   Hematocrit 37.4 34.0 - 46.6 %   MCV 87 79 - 97 fL   MCH 29.8 26.6 - 33.0 pg   MCHC 34.2 31.5 - 35.7 g/dL   RDW 12.6 12.3 - 15.4 %   Platelets 172 150 - 450 x10E3/uL   Neutrophils 68 Not Estab. %   Lymphs 21 Not Estab. %   Monocytes 8  Not Estab. %   Eos 2 Not Estab. %   Basos 1 Not Estab. %   Neutrophils Absolute 3.7 1.4 - 7.0 x10E3/uL   Lymphocytes Absolute 1.1 0.7 - 3.1 x10E3/uL   Monocytes Absolute 0.4 0.1 - 0.9 x10E3/uL   EOS (ABSOLUTE) 0.1 0.0 - 0.4 x10E3/uL   Basophils Absolute 0.1 0.0 - 0.2 x10E3/uL   Immature Granulocytes 0 Not Estab. %   Immature Grans (Abs) 0.0 0.0 - 0.1 x10E3/uL  Comprehensive metabolic panel     Status: None   Collection Time: 12/22/17 10:14 AM  Result Value Ref Range   Glucose 87 65 - 99 mg/dL   BUN 16 6 - 24 mg/dL   Creatinine, Ser 0.93 0.57 - 1.00 mg/dL   GFR calc non Af Amer 68 >59 mL/min/1.73   GFR calc Af Amer 78 >59 mL/min/1.73   BUN/Creatinine Ratio 17 9 - 23   Sodium 140 134 - 144 mmol/L   Potassium 4.5 3.5 - 5.2 mmol/L   Chloride 103 96 - 106 mmol/L   CO2 22 20 - 29 mmol/L   Calcium 9.5 8.7 - 10.2 mg/dL   Total Protein 6.9 6.0 - 8.5 g/dL   Albumin 4.3 3.5 - 5.5 g/dL   Globulin, Total 2.6 1.5 - 4.5 g/dL   Albumin/Globulin Ratio 1.7 1.2 - 2.2   Bilirubin Total 0.6 0.0 - 1.2 mg/dL   Alkaline Phosphatase 70 39 - 117 IU/L   AST 26 0 - 40 IU/L   ALT 19 0 - 32 IU/L   Lipid Panel w/o Chol/HDL Ratio     Status: None   Collection Time: 12/22/17 10:14 AM  Result Value Ref Range   Cholesterol, Total 199 100 - 199 mg/dL   Triglycerides 83 0 - 149 mg/dL   HDL 89 >39 mg/dL   VLDL Cholesterol Cal 17 5 - 40 mg/dL   LDL Calculated 93 0 - 99 mg/dL  Hgb A1c w/o eAG     Status: None   Collection Time: 12/22/17 10:14 AM  Result Value Ref Range   Hgb A1c MFr Bld 5.4 4.8 - 5.6 %    Comment:          Prediabetes: 5.7 - 6.4          Diabetes: >6.4          Glycemic control for adults with diabetes: <7.0   T4, free     Status: None   Collection Time: 12/22/17 10:14 AM  Result Value Ref Range   Free T4 1.20 0.82 - 1.77 ng/dL  TSH     Status: None   Collection Time: 12/22/17 10:14 AM  Result Value Ref Range   TSH 3.010 0.450 - 4.500 uIU/mL  Rheumatoid factor     Status: None   Collection Time: 12/22/17 10:14 AM  Result Value Ref Range   Rhuematoid fact SerPl-aCnc <10.0 0.0 - 13.9 IU/mL  VITAMIN D 25 Hydroxy (Vit-D Deficiency, Fractures)     Status: Abnormal   Collection Time: 12/22/17 10:14 AM  Result Value Ref Range   Vit D, 25-Hydroxy 22.0 (L) 30.0 - 100.0 ng/mL    Comment: Vitamin D deficiency has been defined by the [place] and an Endocrine Society practice guideline as a level of serum 25-OH vitamin D less than 20 ng/mL (1,2). The Endocrine Society went on to further define vitamin D insufficiency as a level between 21 and 29 ng/mL (2). 1. IOM (Institute of Medicine). 2010. Dietary reference    intakes  for calcium and D. [place]: The    [employer]. 2. Holick MF, Binkley Harts, Bischoff-Ferrari HA, et al.    Evaluation, treatment, and prevention of vitamin D    deficiency: an Endocrine Society clinical practice    guideline. JCEM. 2011 Jul; 96(7):1911-30.   ANA w/Reflex if Positive     Status: None   Collection Time: 12/22/17 10:14 AM  Result Value Ref Range   Anti Nuclear Antibody(ANA) Negative Negative  T3      Status: None   Collection Time: 12/22/17 10:14 AM  Result Value Ref Range   T3, Total 114 71 - 180 ng/dL  Sedimentation rate     Status: None   Collection Time: 12/22/17 10:14 AM  Result Value Ref Range   Sed Rate 16 0 - 40 mm/hr   Assessment/Plan: 1. Encounter for general adult medical examination with abnormal findings Annual health maintenance exam with pap smear today. - CBC with Differential/Platelet - CBC with Differential/Platelet - Comprehensive metabolic panel - T4, free - TSH  2. Mixed hyperlipidemia Check fasting lipid panel and treat as indicated.  - Lipid panel  3. Overweight Improving. May continue phentermine daily. Limit calorie intake to 1500 calories per day and advance routine exercise as tolerated.  - phentermine (ADIPEX-P) 37.5 MG tablet; Take 1 tablet (37.5 mg total) by mouth daily before breakfast.  Dispense: 30 tablet; Refill: 0  4. Psoriasis New diagnosis per dermatology. Will check connective tissue panel for further evaluation.  - ANA w/Reflex if Positive - Rheumatoid factor - Sedimentation rate  5. History of ulcerative colitis Check connective tissue panel.  - ANA w/Reflex if Positive - Rheumatoid factor - Sedimentation rate  6. Screening for malignant neoplasm of cervix - Pap IG and HPV (high risk) DNA detection  7. Ovarian failure - DG Bone Density; Future - T4, free - TSH  8. Screening for breast cancer - MM DIGITAL SCREENING BILATERAL; Future  9. Vitamin D deficiency - Vitamin D 1,25 dihydroxy  10. Dysuria - UA/M w/rflx Culture, Routine  General Counseling: Yanin verbalizes understanding of the findings of todays visit and agrees with plan of treatment. I have discussed any further diagnostic evaluation that may be needed or ordered today. We also reviewed her medications today. she has been encouraged to call the office with any questions or concerns that should arise related to todays visit.    Counseling:   There is a  liability release in patients' chart. There has been a 10 minute discussion about the side effects including but not limited to elevated blood pressure, anxiety, lack of sleep and dry mouth. Pt understands and will like to start/continue on appetite suppressant at this time. There will be one month RX given at the time of visit with proper follow up. Nova diet plan with restricted calories is given to the pt. Pt understands and agrees with  plan of treatment  This patient was seen by Leretha Pol FNP Collaboration with Dr Lavera Guise as a part of collaborative care agreement  Orders Placed This Encounter  Procedures  . Microscopic Examination  . Urine Culture, Reflex  . MM DIGITAL SCREENING BILATERAL  . DG Bone Density  . UA/M w/rflx Culture, Routine  . ANA w/Reflex if Positive  . Rheumatoid factor  . Sedimentation rate  . CBC with Differential/Platelet  . CBC with Differential/Platelet  . Comprehensive metabolic panel  . T4, free  . TSH  . Lipid panel  . Vitamin D 1,25 dihydroxy  Meds ordered this encounter  Medications  . phentermine (ADIPEX-P) 37.5 MG tablet    Sig: Take 1 tablet (37.5 mg total) by mouth daily before breakfast.    Dispense:  30 tablet    Refill:  0    Order Specific Question:   Supervising Provider    Answer:   Lavera Guise [[id number]]    Time spent: [place], MD  Internal Medicine

## 2017-12-23 LAB — CBC WITH DIFFERENTIAL/PLATELET
Basophils Absolute: 0.1 10*3/uL (ref 0.0–0.2)
Basos: 1 %
EOS (ABSOLUTE): 0.1 10*3/uL (ref 0.0–0.4)
Eos: 2 %
HEMOGLOBIN: 12.8 g/dL (ref 11.1–15.9)
Hematocrit: 37.4 % (ref 34.0–46.6)
IMMATURE GRANS (ABS): 0 10*3/uL (ref 0.0–0.1)
IMMATURE GRANULOCYTES: 0 %
LYMPHS: 21 %
Lymphocytes Absolute: 1.1 10*3/uL (ref 0.7–3.1)
MCH: 29.8 pg (ref 26.6–33.0)
MCHC: 34.2 g/dL (ref 31.5–35.7)
MCV: 87 fL (ref 79–97)
MONOCYTES: 8 %
Monocytes Absolute: 0.4 10*3/uL (ref 0.1–0.9)
NEUTROS ABS: 3.7 10*3/uL (ref 1.4–7.0)
NEUTROS PCT: 68 %
Platelets: 172 10*3/uL (ref 150–450)
RBC: 4.3 x10E6/uL (ref 3.77–5.28)
RDW: 12.6 % (ref 12.3–15.4)
WBC: 5.3 10*3/uL (ref 3.4–10.8)

## 2017-12-23 LAB — COMPREHENSIVE METABOLIC PANEL
ALT: 19 IU/L (ref 0–32)
AST: 26 IU/L (ref 0–40)
Albumin/Globulin Ratio: 1.7 (ref 1.2–2.2)
Albumin: 4.3 g/dL (ref 3.5–5.5)
Alkaline Phosphatase: 70 IU/L (ref 39–117)
BUN/Creatinine Ratio: 17 (ref 9–23)
BUN: 16 mg/dL (ref 6–24)
Bilirubin Total: 0.6 mg/dL (ref 0.0–1.2)
CALCIUM: 9.5 mg/dL (ref 8.7–10.2)
CO2: 22 mmol/L (ref 20–29)
CREATININE: 0.93 mg/dL (ref 0.57–1.00)
Chloride: 103 mmol/L (ref 96–106)
GFR, EST AFRICAN AMERICAN: 78 mL/min/{1.73_m2} (ref >59)
GFR, EST NON AFRICAN AMERICAN: 68 mL/min/{1.73_m2} (ref >59)
Globulin, Total: 2.6 g/dL (ref 1.5–4.5)
Glucose: 87 mg/dL (ref 65–99)
Potassium: 4.5 mmol/L (ref 3.5–5.2)
SODIUM: 140 mmol/L (ref 134–144)
TOTAL PROTEIN: 6.9 g/dL (ref 6.0–8.5)

## 2017-12-23 LAB — VITAMIN D 25 HYDROXY (VIT D DEFICIENCY, FRACTURES): Vit D, 25-Hydroxy: 22 ng/mL — ABNORMAL LOW (ref 30.0–100.0)

## 2017-12-23 LAB — LIPID PANEL W/O CHOL/HDL RATIO
Cholesterol, Total: 199 mg/dL (ref 100–199)
HDL: 89 mg/dL (ref >39)
LDL Calculated: 93 mg/dL (ref 0–99)
Triglycerides: 83 mg/dL (ref 0–149)
VLDL Cholesterol Cal: 17 mg/dL (ref 5–40)

## 2017-12-23 LAB — T3: T3, Total: 114 ng/dL (ref 71–180)

## 2017-12-23 LAB — RHEUMATOID FACTOR: Rheumatoid fact SerPl-aCnc: <10 IU/mL (ref 0.0–13.9)

## 2017-12-23 LAB — HGB A1C W/O EAG: HEMOGLOBIN A1C: 5.4 % (ref 4.8–5.6)

## 2017-12-23 LAB — SEDIMENTATION RATE: Sed Rate: 16 mm/hr (ref 0–40)

## 2017-12-23 LAB — T4, FREE: FREE T4: 1.2 ng/dL (ref 0.82–1.77)

## 2017-12-23 LAB — ANA W/REFLEX IF POSITIVE: Anti Nuclear Antibody(ANA): NEGATIVE

## 2017-12-23 LAB — TSH: TSH: 3.01 u[IU]/mL (ref 0.450–4.500)

## 2017-12-24 LAB — URINE CULTURE, REFLEX

## 2017-12-24 LAB — MICROSCOPIC EXAMINATION

## 2017-12-24 LAB — UA/M W/RFLX CULTURE, ROUTINE
Bilirubin, UA: NEGATIVE
Glucose, UA: NEGATIVE
Ketones, UA: NEGATIVE
NITRITE UA: NEGATIVE
PH UA: 5 (ref 5.0–7.5)
PROTEIN UA: NEGATIVE
RBC, UA: NEGATIVE
Specific Gravity, UA: 1.02 (ref 1.005–1.030)
Urobilinogen, Ur: 0.2 mg/dL (ref 0.2–1.0)

## 2017-12-25 ENCOUNTER — Telehealth: Payer: Self-pay

## 2017-12-25 DIAGNOSIS — Z1239 Encounter for other screening for malignant neoplasm of breast: Secondary | ICD-10-CM | POA: Insufficient documentation

## 2017-12-25 DIAGNOSIS — L409 Psoriasis, unspecified: Secondary | ICD-10-CM | POA: Insufficient documentation

## 2017-12-25 DIAGNOSIS — E2839 Other primary ovarian failure: Secondary | ICD-10-CM | POA: Insufficient documentation

## 2017-12-25 DIAGNOSIS — Z124 Encounter for screening for malignant neoplasm of cervix: Secondary | ICD-10-CM | POA: Insufficient documentation

## 2017-12-25 DIAGNOSIS — Z8719 Personal history of other diseases of the digestive system: Secondary | ICD-10-CM | POA: Insufficient documentation

## 2017-12-25 MED ORDER — CIPROFLOXACIN HCL 500 MG PO TABS: 500.0000 mg | ORAL_TABLET | Freq: Two times a day (BID) | ORAL | 0 refills | Status: DC

## 2017-12-25 NOTE — Telephone Encounter (Signed)
Pt advised that urine did showed UTI we send cipro and also all other labs look good

## 2017-12-25 NOTE — Progress Notes (Signed)
Please let the patient know that her urine sample from physical indicated uti. I have sent in prescription for cipro 500mg  bid for 10 days. [facility]. Her blood work all looked good. Thanks.

## 2017-12-26 ENCOUNTER — Telehealth: Payer: Self-pay

## 2017-12-26 NOTE — Telephone Encounter (Signed)
-----   Message from Ronnell Freshwater, NP sent at 12/25/2017  8:07 AM EDT ----- Please let the patient know that her blood work was good. Thanks.

## 2017-12-26 NOTE — Telephone Encounter (Signed)
Left message on pt voicemail informing her that her blood work was good.

## 2017-12-27 LAB — PAP IG AND HPV HIGH-RISK
HPV, high-risk: NEGATIVE
PAP SMEAR COMMENT: 0

## 2018-01-11 ENCOUNTER — Ambulatory Visit
Admission: RE | Admit: 2018-01-11 | Discharge: 2018-01-11 | Disposition: A | Discharge status: Home / Self Care | Payer: 59 | Source: Ambulatory Visit | Attending: Nurse Practitioner | Admitting: Nurse Practitioner

## 2018-01-11 DIAGNOSIS — E2839 Other primary ovarian failure: Secondary | ICD-10-CM

## 2018-01-11 DIAGNOSIS — M8589 Other specified disorders of bone density and structure, multiple sites: Secondary | ICD-10-CM | POA: Diagnosis not present

## 2018-01-11 DIAGNOSIS — Z1239 Encounter for other screening for malignant neoplasm of breast: Secondary | ICD-10-CM | POA: Diagnosis not present

## 2018-01-11 DIAGNOSIS — Z780 Asymptomatic menopausal state: Secondary | ICD-10-CM | POA: Diagnosis not present

## 2018-01-11 DIAGNOSIS — Z1231 Encounter for screening mammogram for malignant neoplasm of breast: Secondary | ICD-10-CM | POA: Diagnosis not present

## 2018-01-25 DIAGNOSIS — H25042 Posterior subcapsular polar age-related cataract, left eye: Secondary | ICD-10-CM | POA: Diagnosis not present

## 2018-01-30 ENCOUNTER — Encounter: Payer: Self-pay | Admitting: *Deleted

## 2018-02-06 ENCOUNTER — Encounter
Admission: RE | Disposition: A | Discharge status: Home / Self Care | Payer: Self-pay | Source: Ambulatory Visit | Attending: Ophthalmology

## 2018-02-06 ENCOUNTER — Ambulatory Visit
Admission: RE | Admit: 2018-02-06 | Discharge: 2018-02-06 | Disposition: A | Discharge status: Home / Self Care | Payer: 59 | Source: Ambulatory Visit | Attending: Ophthalmology | Admitting: Ophthalmology

## 2018-02-06 ENCOUNTER — Ambulatory Visit: Payer: 59 | Admitting: Anesthesiology

## 2018-02-06 ENCOUNTER — Other Ambulatory Visit: Payer: Self-pay

## 2018-02-06 ENCOUNTER — Encounter: Payer: Self-pay | Admitting: *Deleted

## 2018-02-06 DIAGNOSIS — H25042 Posterior subcapsular polar age-related cataract, left eye: Secondary | ICD-10-CM | POA: Diagnosis not present

## 2018-02-06 DIAGNOSIS — Z9049 Acquired absence of other specified parts of digestive tract: Secondary | ICD-10-CM | POA: Insufficient documentation

## 2018-02-06 DIAGNOSIS — Z87891 Personal history of nicotine dependence: Secondary | ICD-10-CM | POA: Insufficient documentation

## 2018-02-06 DIAGNOSIS — H2512 Age-related nuclear cataract, left eye: Secondary | ICD-10-CM | POA: Insufficient documentation

## 2018-02-06 DIAGNOSIS — Z85828 Personal history of other malignant neoplasm of skin: Secondary | ICD-10-CM | POA: Insufficient documentation

## 2018-02-06 HISTORY — PX: CATARACT EXTRACTION W/PHACO: SHX586

## 2018-02-06 SURGERY — PHACOEMULSIFICATION, CATARACT, WITH IOL INSERTION
Anesthesia: Monitor Anesthesia Care | Site: Eye | Laterality: Left

## 2018-02-06 MED ORDER — LIDOCAINE HCL (PF) 4 % IJ SOLN
INTRAMUSCULAR | Status: AC
  Filled 2018-02-06: qty 5

## 2018-02-06 MED ORDER — POVIDONE-IODINE 5 % OP SOLN
OPHTHALMIC | Status: DC | PRN
  Administered 2018-02-06: 1 via OPHTHALMIC

## 2018-02-06 MED ORDER — CARBACHOL 0.01 % IO SOLN
INTRAOCULAR | Status: DC | PRN
  Administered 2018-02-06: 0.5 mL via INTRAOCULAR

## 2018-02-06 MED ORDER — POVIDONE-IODINE 5 % OP SOLN
OPHTHALMIC | Status: AC
  Filled 2018-02-06: qty 30

## 2018-02-06 MED ORDER — MOXIFLOXACIN HCL 0.5 % OP SOLN
OPHTHALMIC | Status: DC | PRN
  Administered 2018-02-06: 0.2 mL via OPHTHALMIC

## 2018-02-06 MED ORDER — FENTANYL CITRATE (PF) 100 MCG/2ML IJ SOLN
INTRAMUSCULAR | Status: DC | PRN
  Administered 2018-02-06: 50 ug via INTRAVENOUS

## 2018-02-06 MED ORDER — TETRACAINE HCL 0.5 % OP SOLN
OPHTHALMIC | Status: AC
  Administered 2018-02-06: 1 [drp] via OPHTHALMIC
  Filled 2018-02-06: qty 4

## 2018-02-06 MED ORDER — SODIUM CHLORIDE 0.9 % IV SOLN
INTRAVENOUS | Status: DC
  Administered 2018-02-06: 11:00:00 via INTRAVENOUS

## 2018-02-06 MED ORDER — EPINEPHRINE PF 1 MG/ML IJ SOLN
INTRAMUSCULAR | Status: AC
  Filled 2018-02-06: qty 2

## 2018-02-06 MED ORDER — MOXIFLOXACIN HCL 0.5 % OP SOLN: 1.0000 [drp] | OPHTHALMIC | Status: DC | PRN

## 2018-02-06 MED ORDER — MOXIFLOXACIN HCL 0.5 % OP SOLN
OPHTHALMIC | Status: AC
  Filled 2018-02-06: qty 3

## 2018-02-06 MED ORDER — EPINEPHRINE PF 1 MG/ML IJ SOLN
INTRAOCULAR | Status: DC | PRN
  Administered 2018-02-06: 12:00:00 via OPHTHALMIC

## 2018-02-06 MED ORDER — FENTANYL CITRATE (PF) 100 MCG/2ML IJ SOLN
INTRAMUSCULAR | Status: AC
  Filled 2018-02-06: qty 2

## 2018-02-06 MED ORDER — ARMC OPHTHALMIC DILATING DROPS
OPHTHALMIC | Status: AC
  Administered 2018-02-06: 1 via OPHTHALMIC
  Filled 2018-02-06: qty 0.5

## 2018-02-06 MED ORDER — NA CHONDROIT SULF-NA HYALURON 40-17 MG/ML IO SOLN
INTRAOCULAR | Status: DC | PRN
  Administered 2018-02-06: 1 mL via INTRAOCULAR

## 2018-02-06 MED ORDER — MIDAZOLAM HCL 2 MG/2ML IJ SOLN
INTRAMUSCULAR | Status: AC
  Filled 2018-02-06: qty 2

## 2018-02-06 MED ORDER — ARMC OPHTHALMIC DILATING DROPS
1.0000 "application " | OPHTHALMIC | Status: AC
  Administered 2018-02-06 (×3): 1 via OPHTHALMIC

## 2018-02-06 MED ORDER — LIDOCAINE HCL (PF) 4 % IJ SOLN
INTRAOCULAR | Status: DC | PRN
  Administered 2018-02-06: 4 mL via OPHTHALMIC

## 2018-02-06 MED ORDER — NA CHONDROIT SULF-NA HYALURON 40-17 MG/ML IO SOLN
INTRAOCULAR | Status: AC
  Filled 2018-02-06: qty 1

## 2018-02-06 MED ORDER — MIDAZOLAM HCL 2 MG/2ML IJ SOLN
INTRAMUSCULAR | Status: DC | PRN
  Administered 2018-02-06 (×2): 1 mg via INTRAVENOUS

## 2018-02-06 MED ORDER — TETRACAINE HCL 0.5 % OP SOLN
1.0000 [drp] | OPHTHALMIC | Status: AC | PRN
  Administered 2018-02-06 (×3): 1 [drp] via OPHTHALMIC

## 2018-02-06 SURGICAL SUPPLY — 16 items

## 2018-02-06 NOTE — Op Note (Signed)
PREOPERATIVE DIAGNOSIS:  Nuclear sclerotic cataract of the left eye.   POSTOPERATIVE DIAGNOSIS:  Nuclear sclerotic cataract of the left eye.   OPERATIVE PROCEDURE: Procedure(s): CATARACT EXTRACTION PHACO AND INTRAOCULAR LENS PLACEMENT (IOC)   SURGEON:  Birder Robson, MD.   ANESTHESIA:  Anesthesiologist: Emmie Niemann, MD CRNA: Aline Brochure, CRNA; Philbert Riser, CRNA  1.      Managed anesthesia care. 2.     0.51ml of Shugarcaine was instilled following the paracentesis   COMPLICATIONS:  None.   TECHNIQUE:   Stop and chop   DESCRIPTION OF PROCEDURE:  The patient was examined and consented in the preoperative holding area where the aforementioned topical anesthesia was applied to the left eye and then brought back to the Operating Room where the left eye was prepped and draped in the usual sterile ophthalmic fashion and a lid speculum was placed. A paracentesis was created with the side port blade and the anterior chamber was filled with viscoelastic. A near clear corneal incision was performed with the steel keratome. A continuous curvilinear capsulorrhexis was performed with a cystotome followed by the capsulorrhexis forceps. Hydrodissection and hydrodelineation were carried out with BSS on a blunt cannula. The lens was removed in a stop and chop  technique and the remaining cortical material was removed with the irrigation-aspiration handpiece. The capsular bag was inflated with viscoelastic and the Technis [id number] lens was placed in the capsular bag without complication. The remaining viscoelastic was removed from the eye with the irrigation-aspiration handpiece. The wounds were hydrated. The anterior chamber was flushed with Miostat and the eye was inflated to physiologic pressure. 0.17ml Vigamox was placed in the anterior chamber. The wounds were found to be water tight. The eye was dressed with Vigamox. The patient was given protective glasses to wear throughout the day and a shield  with which to sleep tonight. The patient was also given drops with which to begin a drop regimen today and will follow-up with me in one day. Implant Name Type Inv. Item Serial No. Manufacturer Lot No. LRB No. Used  LENS IOL DIOP 21.0 - [id number] Intraocular Lens LENS IOL DIOP 21.0 [id number] AMO  Left 1    Procedure(s) with comments: CATARACT EXTRACTION PHACO AND INTRAOCULAR LENS PLACEMENT (IOC) (Left) - [place] 00:31.5 CDE 3.49 Fluid pack Lot # [id number]  Electronically signed: Birder Robson 02/06/2018 11:53 AM

## 2018-02-06 NOTE — Anesthesia Preprocedure Evaluation (Signed)
Anesthesia Evaluation  Patient identified by MRN, date of birth, ID band Patient awake    Reviewed: Allergy & Precautions, NPO status , Patient's Chart, lab work & pertinent test results  History of Anesthesia Complications Negative for: history of anesthetic complications  Airway Mallampati: II  TM Distance: >3 FB Neck ROM: Full    Dental no notable dental hx.    Pulmonary neg sleep apnea, neg COPD, former smoker,    breath sounds clear to auscultation- rhonchi (-) wheezing      Cardiovascular Exercise Tolerance: Good (-) hypertension(-) CAD, (-) Past MI, (-) Cardiac Stents and (-) CABG  Rhythm:Regular Rate:Normal - Systolic murmurs and - Diastolic murmurs    Neuro/Psych  Headaches, Anxiety    GI/Hepatic negative GI ROS, Neg liver ROS,   Endo/Other  negative endocrine ROSneg diabetes  Renal/GU negative Renal ROS     Musculoskeletal negative musculoskeletal ROS (+)   Abdominal (+) - obese,   Peds  Hematology  (+) anemia ,   Anesthesia Other Findings Past Medical History: 11/21/2013: Anxiety 11/21/2013: Breath shortness No date: Crohn disease ([place]) 11/21/2013: Dizziness 12/24/2013: Extensor tenosynovitis of wrist 12/24/2013: Ganglion cyst of wrist 11/22/2013: Hypotension, postural No date: Psoriasis No date: Skin cancer   Reproductive/Obstetrics                             Anesthesia Physical Anesthesia Plan  ASA: II  Anesthesia Plan: MAC   Post-op Pain Management:    Induction: Intravenous  PONV Risk Score and Plan: 2 and Midazolam  Airway Management Planned: Natural Airway  Additional Equipment:   Intra-op Plan:   Post-operative Plan:   Informed Consent: I have reviewed the patients History and Physical, chart, labs and discussed the procedure including the risks, benefits and alternatives for the proposed anesthesia with the patient or authorized representative who has  indicated his/her understanding and acceptance.     Plan Discussed with: CRNA and Anesthesiologist  Anesthesia Plan Comments:         Anesthesia Quick Evaluation

## 2018-02-06 NOTE — Discharge Instructions (Addendum)
Eye Surgery Discharge Instructions    Expect mild scratchy sensation or mild soreness. DO NOT RUB YOUR EYE!  The day of surgery:  Minimal physical activity, but bed rest is not required  No reading, computer work, or close hand work  No bending, lifting, or straining.  May watch TV  For 24 hours:  No driving, legal decisions, or alcoholic beverages  Safety precautions  Eat anything you prefer: It is better to start with liquids, then soup then solid foods.  _____ Eye patch should be worn until postoperative exam tomorrow.  ____ Solar shield eyeglasses should be worn for comfort in the sunlight/patch while sleeping  Resume all regular medications including aspirin or Coumadin if these were discontinued prior to surgery. You may shower, bathe, shave, or wash your hair. Tylenol may be taken for mild discomfort.  Call your doctor if you experience significant pain, nausea, or vomiting, fever > 101 or other signs of infection. [phone] or [phone] Specific instructions:  Follow-up Information    Birder Robson, MD Follow up on 02/07/2018.   Specialty:  Ophthalmology Why:  10:00 am Contact information: [address] [place] [address] [phone]

## 2018-02-06 NOTE — Anesthesia Post-op Follow-up Note (Signed)
Anesthesia QCDR form completed.        

## 2018-02-06 NOTE — Transfer of Care (Signed)
Immediate Anesthesia Transfer of Care Note  Patient: Ruth Morales  Procedure(s) Performed: CATARACT EXTRACTION PHACO AND INTRAOCULAR LENS PLACEMENT (IOC) (Left Eye)  Patient Location: PACU  Anesthesia Type:MAC  Level of Consciousness: awake, alert  and oriented  Airway & Oxygen Therapy: Patient Spontanous Breathing  Post-op Assessment: Report given to RN and Post -op Vital signs reviewed and stable  Post vital signs: Reviewed and stable  Last Vitals:  Vitals Value Taken Time  BP    Temp    Pulse    Resp    SpO2      Last Pain:  Vitals:   02/06/18 1039  TempSrc: Temporal  PainSc: 0-No pain      Patients Stated Pain Goal: 0 (51/70/01 [id number])  Complications: No apparent anesthesia complications

## 2018-02-06 NOTE — H&P (Signed)
All labs reviewed. Abnormal studies sent to patients PCP when indicated.  Previous H&P reviewed, patient examined, there are NO CHANGES.   Porfilio10/29/201911:28 AM

## 2018-02-06 NOTE — Anesthesia Postprocedure Evaluation (Signed)
Anesthesia Post Note  Patient: Ruth Morales  Procedure(s) Performed: CATARACT EXTRACTION PHACO AND INTRAOCULAR LENS PLACEMENT (IOC) (Left Eye)  Patient location during evaluation: PACU Anesthesia Type: MAC Level of consciousness: awake and alert and oriented Pain management: pain level controlled Vital Signs Assessment: post-procedure vital signs reviewed and stable Respiratory status: spontaneous breathing, nonlabored ventilation and respiratory function stable Cardiovascular status: blood pressure returned to baseline and stable Postop Assessment: no signs of nausea or vomiting Anesthetic complications: no     Last Vitals:  Vitals:   02/06/18 1039 02/06/18 1156  BP: 119/75 135/77  Pulse: 76 65  Resp: 20 16  Temp: (!) 36.3 C 36.7 C  SpO2: 98% 98%    Last Pain:  Vitals:   02/06/18 1156  TempSrc: Oral  PainSc: 0-No pain                  

## 2018-02-12 ENCOUNTER — Encounter: Payer: Self-pay | Admitting: Adult Health

## 2018-02-12 ENCOUNTER — Ambulatory Visit (INDEPENDENT_AMBULATORY_CARE_PROVIDER_SITE_OTHER): Payer: 59 | Admitting: Adult Health

## 2018-02-12 VITALS — BP 136/84 | HR 78 | Resp 16 | Ht 68.5 in | Wt 175.0 lb

## 2018-02-12 DIAGNOSIS — E663 Overweight: Secondary | ICD-10-CM | POA: Diagnosis not present

## 2018-02-12 DIAGNOSIS — M8589 Other specified disorders of bone density and structure, multiple sites: Secondary | ICD-10-CM | POA: Diagnosis not present

## 2018-02-12 DIAGNOSIS — N959 Unspecified menopausal and perimenopausal disorder: Secondary | ICD-10-CM | POA: Diagnosis not present

## 2018-02-12 MED ORDER — PHENTERMINE HCL 37.5 MG PO TABS: 37.5000 mg | ORAL_TABLET | Freq: Every day | ORAL | 0 refills | Status: DC

## 2018-02-12 NOTE — Patient Instructions (Signed)
Phentermine sustained-release capsules What is this medicine? PHENTERMINE (FEN ter meen) decreases your appetite. It is used with a reduced calorie diet and exercise to help you lose weight. This medicine may be used for other purposes; ask your health care provider or pharmacist if you have questions. COMMON BRAND NAME(S): Ionamin, Pro-Fast What should I tell my health care provider before I take this medicine? They need to know if you have any of these conditions: -agitation -glaucoma -heart disease -high blood pressure -history of substance abuse -lung disease called Primary Pulmonary Hypertension (PPH) -taken an MAOI like Carbex, Eldepryl, Marplan, Nardil, or Parnate in last 14 days -thyroid disease -an unusual or allergic reaction to phentermine, other medicines, foods, dyes, or preservatives -pregnant or trying to get pregnant -breast-feeding How should I use this medicine? Take this medicine by mouth with a glass of water. Follow the directions on the prescription label. This medicine is usually taken before breakfast or at least 10 to 14 hours before going to bed. Avoid taking this medicine in the evening. It may interfere with sleep. Swallow whole. Do not open or chew the capsules. Take your doses at regular intervals. Do not take your medicine more often than directed. Talk to your pediatrician regarding the use of this medicine in children. Special care may be needed. Overdosage: If you think you have taken too much of this medicine contact a poison control center or emergency room at once. NOTE: This medicine is only for you. Do not share this medicine with others. What if I miss a dose? If you miss a dose, take it as soon as you can. If it is almost time for your next dose, take only that dose. Do not take double or extra doses. What may interact with this medicine? Do not take this medicine with any of the following medications: -duloxetine -MAOIs like Carbex, Eldepryl,  Marplan, Nardil, and Parnate -medicines for colds or breathing difficulties like pseudoephedrine or phenylephrine -procarbazine -sibutramine -SSRIs like citalopram, escitalopram, fluoxetine, fluvoxamine, paroxetine, and sertraline -stimulants like dexmethylphenidate, methylphenidate or modafinil -venlafaxine This medicine may also interact with the following medications: -medicines for diabetes This list may not describe all possible interactions. Give your health care provider a list of all the medicines, herbs, non-prescription drugs, or dietary supplements you use. Also tell them if you smoke, drink alcohol, or use illegal drugs. Some items may interact with your medicine. What should I watch for while using this medicine? Notify your physician immediately if you become short of breath while doing your normal activities. Do not take this medicine within 6 hours of bedtime. It can keep you from getting to sleep. Avoid drinks that contain caffeine and try to stick to a regular bedtime every night. This medicine was intended to be used in addition to a healthy diet and exercise. The best results are achieved this way. This medicine is only indicated for short-term use. Eventually your weight loss may level out. At that point, the drug will only help you maintain your new weight. Do not increase or in any way change your dose without consulting your doctor. You may get drowsy or dizzy. Do not drive, use machinery, or do anything that needs mental alertness until you know how this medicine affects you. Do not stand or sit up quickly, especially if you are an older patient. This reduces the risk of dizzy or fainting spells. Alcohol may increase dizziness and drowsiness. Avoid alcoholic drinks. What side effects may I notice from receiving this  medicine? Side effects that you should report to your doctor or health care professional as soon as possible: -chest pain, palpitations -depression or severe  changes in mood -increased blood pressure -irritability -nervousness or restlessness -severe dizziness -shortness of breath -problems urinating -unusual swelling of the legs -vomiting Side effects that usually do not require medical attention (report to your doctor or health care professional if they continue or are bothersome): -blurred vision or other eye problems -changes in sexual ability or desire -constipation or diarrhea -difficulty sleeping -dry mouth or unpleasant taste -headache -nausea This list may not describe all possible side effects. Call your doctor for medical advice about side effects. You may report side effects to FDA at 1-800-FDA-1088. Where should I keep my medicine? Keep out of the reach of children. This medicine can be abused. Keep your medicine in a safe place to protect it from theft. Do not share this medicine with anyone. Selling or giving away this medicine is dangerous and against the law. This medicine may cause accidental overdose and death if taken by other adults, children, or pets. Mix any unused medicine with a substance like cat litter or coffee grounds. Then throw the medicine away in a sealed container like a sealed bag or a coffee can with a lid. Do not use the medicine after the expiration date. Store at room temperature between 20 and 25 degrees C (68 and 77 degrees F). Keep container tightly closed. NOTE: This sheet is a summary. It may not cover all possible information. If you have questions about this medicine, talk to your doctor, pharmacist, or health care provider.  2018 Elsevier/Gold Standard (2013-12-17 16:19:17)

## 2018-02-12 NOTE — Progress Notes (Signed)
[facility] [place], [place] [address]  Internal MEDICINE  Office Visit Note  Patient Name: Ruth Morales  [id number]  [id number]  Date of Service: 02/14/2018  Chief Complaint  Patient presents with  . Menopause  . Medical Management of Chronic Issues    weight loss management    HPI  Patient is here for 6-week follow-up.  She had a DEXA scan since last visit and would like to discuss the results.  She also takes phentermine for weight loss currently.  It appears she has lost 3 pounds since her last visit.  She denies any chest pain, shortness of breath, palpitations, or other side effects from phentermine.    Current Medication: Outpatient Encounter Medications as of 02/12/2018  Medication Sig  . ciprofloxacin (CIPRO) 500 MG tablet Take 1 tablet (500 mg total) by mouth 2 (two) times daily. (Patient taking differently: Take 500 mg by mouth 2 (two) times daily as needed (for flare ups). )  . estradiol (CLIMARA - DOSED IN MG/24 HR) 0.025 mg/24hr patch PLACE 1 PATCH (0.025 MG TOTAL) ONTO THE SKIN ONCE A WEEK. (Patient taking differently: Place 0.025 mg onto the skin every Sunday. )  . ibuprofen (ADVIL,MOTRIN) 200 MG tablet Take 600 mg by mouth every 6 (six) hours as needed for headache or moderate pain.  Marland Kitchen loperamide (IMODIUM) 2 MG capsule Take 4 mg by mouth 2 (two) times daily.   . phentermine (ADIPEX-P) 37.5 MG tablet Take 1 tablet (37.5 mg total) by mouth daily before breakfast.  . progesterone (PROMETRIUM) 100 MG capsule Take 1 capsule (100 mg total) by mouth daily. (Patient taking differently: Take 100 mg by mouth once a week. )  . [DISCONTINUED] phentermine (ADIPEX-P) 37.5 MG tablet Take 1 tablet (37.5 mg total) by mouth daily before breakfast.   No facility-administered encounter medications on file as of 02/12/2018.     Surgical History: Past Surgical History:  Procedure Laterality Date  . BREAST BIOPSY Left 2003   needle bx-neg  . BREAST SURGERY  Bilateral 2004   breast reduction  . CATARACT EXTRACTION W/PHACO Left 02/06/2018   Procedure: CATARACT EXTRACTION PHACO AND INTRAOCULAR LENS PLACEMENT (IOC);  Surgeon: Birder Robson, MD;  Location: [facility] ORS;  Service: Ophthalmology;  Laterality: Left;  [place] 00:31.5 CDE 3.49 Fluid pack Lot # [id number]  . COLON SURGERY  2004   colon removed  . CTR    . EXTRACORPOREAL SHOCK WAVE LITHOTRIPSY Right 02/26/2015   Procedure: EXTRACORPOREAL SHOCK WAVE LITHOTRIPSY (ESWL);  Surgeon: Collier Flowers, MD;  Location: [facility] ORS;  Service: Urology;  Laterality: Right;  . REDUCTION MAMMAPLASTY Bilateral 2005    Medical History: Past Medical History:  Diagnosis Date  . Anxiety 11/21/2013  . Breath shortness 11/21/2013  . Crohn disease ([place])   . Dizziness 11/21/2013  . Extensor tenosynovitis of wrist 12/24/2013  . Ganglion cyst of wrist 12/24/2013  . Hypotension, postural 11/22/2013  . Psoriasis   . Skin cancer     Family History: Family History  Problem Relation Age of Onset  . Diabetes Father   . Diabetes Mother   . Heart disease Maternal Grandmother   . Breast cancer Neg Hx     Social History   Socioeconomic History  . Marital status: Married    Spouse name: Not on file  . Number of children: Not on file  . Years of education: Not on file  . Highest education level: Not on file  Occupational History  . Not on  file  Social Needs  . Financial resource strain: Not on file  . Food insecurity:    Worry: Not on file    Inability: Not on file  . Transportation needs:    Medical: Not on file    Non-medical: Not on file  Tobacco Use  . Smoking status: Former Research scientist (life sciences)  . Smokeless tobacco: Never Used  Substance and Sexual Activity  . Alcohol use: Yes    Comment: occasional drink, once a month  . Drug use: No  . Sexual activity: Yes    Partners: Male  Lifestyle  . Physical activity:    Days per week: Not on file    Minutes per session: Not on file  . Stress: Not on file   Relationships  . Social connections:    Talks on phone: Not on file    Gets together: Not on file    Attends religious service: Not on file    Active member of club or organization: Not on file    Attends meetings of clubs or organizations: Not on file    Relationship status: Not on file  . Intimate partner violence:    Fear of current or ex partner: Not on file    Emotionally abused: Not on file    Physically abused: Not on file    Forced sexual activity: Not on file  Other Topics Concern  . Not on file  Social History Narrative  . Not on file      Review of Systems  Constitutional: Negative for chills, fatigue and unexpected weight change.  HENT: Negative for congestion, rhinorrhea, sneezing and sore throat.   Eyes: Negative for photophobia, pain and redness.  Respiratory: Negative for cough, chest tightness and shortness of breath.   Cardiovascular: Negative for chest pain and palpitations.  Gastrointestinal: Negative for abdominal pain, constipation, diarrhea, nausea and vomiting.  Endocrine: Negative.   Genitourinary: Negative for dysuria and frequency.  Musculoskeletal: Negative for arthralgias, back pain, joint swelling and neck pain.  Skin: Negative for rash.  Allergic/Immunologic: Negative.   Neurological: Negative for tremors and numbness.  Hematological: Negative for adenopathy. Does not bruise/bleed easily.  Psychiatric/Behavioral: Negative for behavioral problems and sleep disturbance. The patient is not nervous/anxious.     Vital Signs: BP 136/84 (BP Location: Left Arm, Patient Position: Sitting, Cuff Size: Normal)   Pulse 78   Resp 16   Ht 5' 8.5" (1.74 m)   Wt 175 lb (79.4 kg)   SpO2 99%   BMI 26.22 kg/m    Physical Exam  Constitutional: She is oriented to person, place, and time. She appears well-developed and well-nourished. No distress.  HENT:  Head: Normocephalic and atraumatic.  Mouth/Throat: Oropharynx is clear and moist. No oropharyngeal  exudate.  Eyes: Pupils are equal, round, and reactive to light. EOM are normal.  Neck: Normal range of motion. Neck supple. No JVD present. No tracheal deviation present. No thyromegaly present.  Cardiovascular: Normal rate, regular rhythm and normal heart sounds. Exam reveals no gallop and no friction rub.  No murmur heard. Pulmonary/Chest: Effort normal and breath sounds normal. No respiratory distress. She has no wheezes. She has no rales. She exhibits no tenderness.  Abdominal: Soft. There is no tenderness. There is no guarding.  Musculoskeletal: Normal range of motion.  Lymphadenopathy:    She has no cervical adenopathy.  Neurological: She is alert and oriented to person, place, and time. No cranial nerve deficit.  Skin: Skin is warm and dry. She is not diaphoretic.  Psychiatric: She has a normal mood and affect. Her behavior is normal. Judgment and thought content normal.  Nursing note and vitals reviewed.  Assessment/Plan: 1. Osteopenia of multiple sites Encouraged patient to take OTC Vit D/Calcium. Continue with HRT.   2. Overweight There is a liability release in patients' chart. There has been a 10 minute discussion about the side effects including but not limited to elevated blood pressure, anxiety, lack of sleep and dry mouth. Pt understands and will like to start/continue on appetite suppressant at this time. There will be one month RX given at the time of visit with proper follow up. Nova diet plan with restricted calories is given to the pt. Pt understands and agrees with  plan of treatment - phentermine (ADIPEX-P) 37.5 MG tablet; Take 1 tablet (37.5 mg total) by mouth daily before breakfast.  Dispense: 30 tablet; Refill: 0 Refilled Controlled medications today. Reviewed risks and possible side effects associated with taking Stimulants. Combination of these drugs with other psychotropic medications could cause dizziness and drowsiness. Pt needs to Monitor symptoms and exercise  caution in driving and operating heavy machinery to avoid damages to oneself, to others and to the surroundings. Patient verbalized understanding in this matter. Dependence and abuse for these drugs will be monitored closely. A Controlled substance policy and procedure is on file which allows [facility] to order a urine drug screen test at any visit. Patient understands and agrees with the plan.  3. Menopausal and perimenopausal disorder Continue with hormone therapy as prescribed.  She appears stable at this time and denies any complaints.  General Counseling: Tressia verbalizes understanding of the findings of todays visit and agrees with plan of treatment. I have discussed any further diagnostic evaluation that may be needed or ordered today. We also reviewed her medications today. she has been encouraged to call the office with any questions or concerns that should arise related to todays visit.    No orders of the defined types were placed in this encounter.   Meds ordered this encounter  Medications  . phentermine (ADIPEX-P) 37.5 MG tablet    Sig: Take 1 tablet (37.5 mg total) by mouth daily before breakfast.    Dispense:  30 tablet    Refill:  0    Time spent: 25 Minutes   This patient was seen by Orson Gear AGNP-C in Collaboration with Dr Lavera Guise as a part of collaborative care agreement     Kendell Bane AGNP-C Internal medicine

## 2018-02-15 ENCOUNTER — Telehealth: Payer: Self-pay

## 2018-02-15 NOTE — Telephone Encounter (Signed)
Informed pt of her calcium and vitamin d levels and advised her per Ruth Morales of what she needed to do as far as supplements.

## 2018-02-15 NOTE — Telephone Encounter (Signed)
-----   Message from Kendell Bane, NP sent at 02/14/2018 11:03 AM EST ----- Please call patient and tell her to get an Over the counter Vit D and calcium supplement.  One of the more popular ones is [place].   She needs at least 1200 mg of calcium and 600-900 international units of vit D to help her bone density.  Also she should try and do some weight bearing exercise like walking.

## 2018-02-15 NOTE — Telephone Encounter (Signed)
Left a message on pt voicemail to call back for her results.

## 2018-02-16 DIAGNOSIS — Z23 Encounter for immunization: Secondary | ICD-10-CM | POA: Diagnosis not present

## 2018-02-26 ENCOUNTER — Encounter: Payer: Self-pay | Admitting: Emergency Medicine

## 2018-02-26 ENCOUNTER — Other Ambulatory Visit: Payer: Self-pay

## 2018-02-26 ENCOUNTER — Ambulatory Visit (INDEPENDENT_AMBULATORY_CARE_PROVIDER_SITE_OTHER)
Admission: EM | Admit: 2018-02-26 | Discharge: 2018-02-26 | Disposition: A | Discharge status: Other Acute Inpatient Hospital | Payer: 59 | Source: Home / Self Care | Attending: Emergency Medicine | Admitting: Emergency Medicine

## 2018-02-26 ENCOUNTER — Emergency Department: Payer: 59

## 2018-02-26 ENCOUNTER — Emergency Department
Admission: EM | Admit: 2018-02-26 | Discharge: 2018-02-26 | Disposition: A | Discharge status: Home / Self Care | Payer: 59 | Attending: Student in an Organized Health Care Education/Training Program | Admitting: Student in an Organized Health Care Education/Training Program

## 2018-02-26 DIAGNOSIS — R0789 Other chest pain: Secondary | ICD-10-CM

## 2018-02-26 DIAGNOSIS — R079 Chest pain, unspecified: Secondary | ICD-10-CM

## 2018-02-26 DIAGNOSIS — R110 Nausea: Secondary | ICD-10-CM | POA: Diagnosis not present

## 2018-02-26 DIAGNOSIS — Z87891 Personal history of nicotine dependence: Secondary | ICD-10-CM | POA: Diagnosis not present

## 2018-02-26 DIAGNOSIS — Z85828 Personal history of other malignant neoplasm of skin: Secondary | ICD-10-CM | POA: Insufficient documentation

## 2018-02-26 LAB — BASIC METABOLIC PANEL
Anion gap: 6 (ref 5–15)
BUN: 18 mg/dL (ref 6–20)
CALCIUM: 9.3 mg/dL (ref 8.9–10.3)
CO2: 28 mmol/L (ref 22–32)
CREATININE: 0.78 mg/dL (ref 0.44–1.00)
Chloride: 105 mmol/L (ref 98–111)
GFR calc non Af Amer: >60 mL/min (ref >60)
Glucose, Bld: 92 mg/dL (ref 70–99)
Potassium: 3.6 mmol/L (ref 3.5–5.1)
SODIUM: 139 mmol/L (ref 135–145)

## 2018-02-26 LAB — CBC
HCT: 38.2 % (ref 36.0–46.0)
Hemoglobin: 12.7 g/dL (ref 12.0–15.0)
MCH: 30.5 pg (ref 26.0–34.0)
MCHC: 33.2 g/dL (ref 30.0–36.0)
MCV: 91.8 fL (ref 80.0–100.0)
Platelets: 180 10*3/uL (ref 150–400)
RBC: 4.16 MIL/uL (ref 3.87–5.11)
RDW: 12.7 % (ref 11.5–15.5)
WBC: 7.4 10*3/uL (ref 4.0–10.5)
nRBC: 0 % (ref 0.0–0.2)

## 2018-02-26 LAB — TROPONIN I

## 2018-02-26 LAB — FIBRIN DERIVATIVES D-DIMER (ARMC ONLY): FIBRIN DERIVATIVES D-DIMER (ARMC): 473.99 ng{FEU}/mL (ref 0.00–499.00)

## 2018-02-26 MED ORDER — HYDROCODONE-ACETAMINOPHEN 5-325 MG PO TABS: 1.0000 | ORAL_TABLET | Freq: Once | ORAL | Status: DC

## 2018-02-26 MED ORDER — ASPIRIN 81 MG PO CHEW
324.0000 mg | CHEWABLE_TABLET | Freq: Once | ORAL | Status: AC
  Administered 2018-02-26: 324 mg via ORAL

## 2018-02-26 MED ORDER — SUCRALFATE 1 G PO TABS
1.0000 g | ORAL_TABLET | Freq: Once | ORAL | Status: AC
  Administered 2018-02-26: 1 g via ORAL
  Filled 2018-02-26: qty 1

## 2018-02-26 NOTE — ED Triage Notes (Signed)
Patient presents to the ED with chest pain since 2am.  Patient states she took an alka-seltzer and went back to sleep and when she awoke at 6am she took a nexium, thinking her chest pain was indigestion.  Patient states she has continued to have chest pain throughout the day.  Patient states pain is pressure in the center of her chest and radiates to the right side of her neck.  Patient denies nausea, shortness of breath and feeling light headed.  Patient denies diaphoresis.

## 2018-02-26 NOTE — ED Triage Notes (Signed)
Patient c/o mid sternal chest pain/pressure that started at 2am this morning. Patient stated she took Tums at that time with no relief. She then got up and took a Nexium earlier this morning as well. Patient also has neck pain that started on the way here. Denies SOB, N/V, abd pain, diaphoresis, arm and back pain.

## 2018-02-26 NOTE — ED Provider Notes (Signed)
[facility] Emergency Department Provider Note    First MD Initiated Contact with Patient 02/26/18 1952     (approximate)  I have reviewed the triage vital signs and the nursing notes.   HISTORY  Chief Complaint Chest Pain    HPI Ruth Morales is a 57 y.o. female   the below listed past medical history presents with chest discomfort since 2 AM.  Woke up with melena night thinking that it was heartburn took some over-the-counter medications without any solution.  Denies any worsening pain with activity or movement.  No diaphoresis.  Had some nausea but no vomiting.  No cough.  Denies any shortness of breath.  No pleuritic pain.  She is on HRT.  Was seen at urgent care and was directed to the ER given her presentation due to concern for cardiac pathology.   Past Medical History:  Diagnosis Date  . Anxiety 11/21/2013  . Breath shortness 11/21/2013  . Crohn disease ([place])   . Dizziness 11/21/2013  . Extensor tenosynovitis of wrist 12/24/2013  . Ganglion cyst of wrist 12/24/2013  . Hypotension, postural 11/22/2013  . Psoriasis   . Skin cancer    Family History  Problem Relation Age of Onset  . Diabetes Father   . Diabetes Mother   . Heart disease Maternal Grandmother   . Breast cancer Neg Hx    Past Surgical History:  Procedure Laterality Date  . BREAST BIOPSY Left 2003   needle bx-neg  . BREAST SURGERY Bilateral 2004   breast reduction  . CATARACT EXTRACTION W/PHACO Left 02/06/2018   Procedure: CATARACT EXTRACTION PHACO AND INTRAOCULAR LENS PLACEMENT (IOC);  Surgeon: Birder Robson, MD;  Location: [facility] ORS;  Service: Ophthalmology;  Laterality: Left;  [place] 00:31.5 CDE 3.49 Fluid pack Lot # [id number]  . COLON SURGERY  2004   colon removed  . CTR    . EXTRACORPOREAL SHOCK WAVE LITHOTRIPSY Right 02/26/2015   Procedure: EXTRACORPOREAL SHOCK WAVE LITHOTRIPSY (ESWL);  Surgeon: Collier Flowers, MD;  Location: [facility] ORS;  Service: Urology;  Laterality: Right;    . REDUCTION MAMMAPLASTY Bilateral 2005   Patient Active Problem List   Diagnosis Date Noted  . Screening for breast cancer 12/25/2017  . History of ulcerative colitis 12/25/2017  . Screening for malignant neoplasm of cervix 12/25/2017  . Ovarian failure 12/25/2017  . Psoriasis 12/25/2017  . Pelvic pain 08/21/2017  . Dysuria 08/02/2017  . Urinary tract infection with hematuria 08/02/2017  . Menopausal and perimenopausal disorder 05/26/2017  . Overweight 04/14/2017  . Candidiasis of vulva and vagina 04/14/2017  . Mixed hyperlipidemia 04/14/2017  . Syncope and collapse 04/14/2017  . Low back pain 04/14/2017  . Vitamin D deficiency 04/14/2017  . Vitamin B12 deficiency anemia, unspecified 04/14/2017  . Vitamin B deficiency, unspecified 04/14/2017  . Vascular headache, not elsewhere classified 04/14/2017  . Extensor tenosynovitis of wrist 12/24/2013  . Ganglion cyst of wrist 12/24/2013  . Hypotension, postural 11/22/2013  . Anxiety 11/21/2013  . Dizziness 11/21/2013  . Breath shortness 11/21/2013      Prior to Admission medications   Medication Sig Start Date End Date Taking? Authorizing Provider  ciprofloxacin (CIPRO) 500 MG tablet Take 1 tablet (500 mg total) by mouth 2 (two) times daily. Patient taking differently: Take 500 mg by mouth 2 (two) times daily as needed (for flare ups).  12/25/17  Yes Boscia, Greer Ee, NP  estradiol (CLIMARA - DOSED IN MG/24 HR) 0.025 mg/24hr patch PLACE 1 PATCH (0.025 MG  TOTAL) ONTO THE SKIN ONCE A WEEK. Patient taking differently: Place 0.025 mg onto the skin every Sunday.  09/18/17  Yes Boscia, Greer Ee, NP  loperamide (IMODIUM) 2 MG capsule Take 4 mg by mouth 2 (two) times daily.    Yes [provider]  progesterone (PROMETRIUM) 100 MG capsule Take 1 capsule (100 mg total) by mouth daily. Patient taking differently: Take 100 mg by mouth once a week.  07/11/17  Yes Boscia, Greer Ee, NP  ibuprofen (ADVIL,MOTRIN) 200 MG tablet Take 600 mg  by mouth every 6 (six) hours as needed for headache or moderate pain.    [provider]    Allergies Morphine; Penicillins; Macrobid [nitrofurantoin macrocrystal]; and Benadryl [diphenhydramine hcl]    Social History Social History   Tobacco Use  . Smoking status: Former Research scientist (life sciences)  . Smokeless tobacco: Never Used  Substance Use Topics  . Alcohol use: Yes    Comment: occasional drink, once a month  . Drug use: No    Review of Systems Patient denies headaches, rhinorrhea, blurry vision, numbness, shortness of breath, chest pain, edema, cough, abdominal pain, nausea, vomiting, diarrhea, dysuria, fevers, rashes or hallucinations unless otherwise stated above in HPI. ____________________________________________   PHYSICAL EXAM:  VITAL SIGNS: Vitals:   02/26/18 1719  BP: (!) 156/80  Pulse: 61  Resp: 18  Temp: 97.8 F (36.6 C)  SpO2: 100%   .Constitutional: Alert and oriented.  Eyes: Conjunctivae are normal.  Head: Atraumatic. Nose: No congestion/rhinnorhea. Mouth/Throat: Mucous membranes are moist.   Neck: No stridor. Painless ROM.  Cardiovascular: Normal rate, regular rhythm. Grossly normal heart sounds.  Good peripheral circulation. Respiratory: Normal respiratory effort.  No retractions. Lungs CTAB. Gastrointestinal: Soft and nontender. No distention. No abdominal bruits. No CVA tenderness. Genitourinary:  Musculoskeletal: No lower extremity tenderness nor edema.  No joint effusions. Neurologic:  Normal speech and language. No gross focal neurologic deficits are appreciated. No facial droop Skin:  Skin is warm, dry and intact. No rash noted. Psychiatric: Mood and affect are normal. Speech and behavior are normal.  ____________________________________________   LABS (all labs ordered are listed, but only abnormal results are displayed)  Results for orders placed or performed during the hospital encounter of 02/26/18 (from the past 24 hour(s))  Basic  metabolic panel     Status: None   Collection Time: 02/26/18  5:41 PM  Result Value Ref Range   Sodium 139 135 - 145 mmol/L   Potassium 3.6 3.5 - 5.1 mmol/L   Chloride 105 98 - 111 mmol/L   CO2 28 22 - 32 mmol/L   Glucose, Bld 92 70 - 99 mg/dL   BUN 18 6 - 20 mg/dL   Creatinine, Ser 0.78 0.44 - 1.00 mg/dL   Calcium 9.3 8.9 - 10.3 mg/dL   GFR calc non Af Amer >60 >60 mL/min   GFR calc Af Amer >60 >60 mL/min   Anion gap 6 5 - 15  Troponin I - ONCE - STAT     Status: None   Collection Time: 02/26/18  5:41 PM  Result Value Ref Range   Troponin I <0.03 <0.03 ng/mL  CBC     Status: None   Collection Time: 02/26/18  5:41 PM  Result Value Ref Range   WBC 7.4 4.0 - 10.5 K/uL   RBC 4.16 3.87 - 5.11 MIL/uL   Hemoglobin 12.7 12.0 - 15.0 g/dL   HCT 38.2 36.0 - 46.0 %   MCV 91.8 80.0 - 100.0 fL  MCH 30.5 26.0 - 34.0 pg   MCHC 33.2 30.0 - 36.0 g/dL   RDW 12.7 11.5 - 15.5 %   Platelets 180 150 - 400 K/uL   nRBC 0.0 0.0 - 0.2 %   ____________________________________________  EKG My review and personal interpretation at Time: 17:26   Indication: chest pain  Rate: 65  Rhythm: sinus Axis: normal Other: normal intervals, no stemi ____________________________________________  RADIOLOGY  I personally reviewed all radiographic images ordered to evaluate for the above acute complaints and reviewed radiology reports and findings.  These findings were personally discussed with the patient.  Please see medical record for radiology report.  ____________________________________________   PROCEDURES  Procedure(s) performed:  Procedures    Critical Care performed: no ____________________________________________   INITIAL IMPRESSION / ASSESSMENT AND PLAN / ED COURSE  Pertinent labs & imaging results that were available during my care of the patient were reviewed by me and considered in my medical decision making (see chart for details).   DDX: ACS, pericarditis, pe, dissection, pna,  bronchitis, costochondritis   Ruth Morales is a 57 y.o. who presents to the ED with symptoms as described above.  Patient low risk heart score of 2 versus 3.  Patient low risk Wells criteria but she is on age 36 will send d-dimer to further stratify for PE.  Patient is currently pain-free.  The patient will be placed on continuous pulse oximetry and telemetry for monitoring.  Laboratory evaluation will be sent to evaluate for the above complaints.     Clinical Course as of Feb 27 2140  Mon Feb 26, 2018  2105 Troponin and d-dimer both negative.  She has a heart score of roughly 2 versus 3 based on subjectivity therefore I do believe the absence of objective findings here that she is stable and appropriate for outpatient follow-up.   [PR]    Clinical Course User Index [PR] Merlyn Lot, MD     As part of my medical decision making, I reviewed the following data within the [facility] notes reviewed and incorporated, Labs reviewed, notes from prior ED visits.   ____________________________________________   FINAL CLINICAL IMPRESSION(S) / ED DIAGNOSES  Final diagnoses:  Atypical chest pain      NEW MEDICATIONS STARTED DURING THIS VISIT:  New Prescriptions   No medications on file     Note:  This document was prepared using Dragon voice recognition software and may include unintentional dictation errors.    Merlyn Lot, MD 02/26/18 2142

## 2018-02-26 NOTE — ED Notes (Signed)
MD at the bedside for pt evaluation  

## 2018-02-26 NOTE — ED Provider Notes (Signed)
HPI  SUBJECTIVE:  Ruth Morales is a 57 y.o. female who presents with substernal chest pain described as pressure, "indigestion" that woke her up from sleep this morning at 2 AM.  She tried Tums/Alka-Seltzer chewable which did not help her symptoms but she was able to go back to sleep.  She woke up this morning still having the pain and took a Nexium at 630.  She states that it radiates up her right neck.  She states that it is not getting worse, but it is not going away.  There are no alleviating factors.  It is not associated with exertion, lying down, bending forward, eating, torso rotation, arm movement.  No wheezing, coughing, shortness of breath.  No recent viral illness.  No burning chest pain, belching, water brash.  No nausea, diaphoresis, abdominal pain, palpitations.  No radiation down her arm, through to her back.  No presyncope, syncope.  She has never had symptoms like this before.  She has a past medical history of GERD, ulcerative colitis status post colectomy.  She quit smoking 22 years ago.  She is on HRT for postmenopausal symptoms and osteopenia.  No history of MI, diabetes, hypertension, hypercholesterolemia, HIV, arrhythmia, PE, DVT.  WER:XVQM, Timoteo Gaul, MD     Past Medical History:  Diagnosis Date  . Anxiety 11/21/2013  . Breath shortness 11/21/2013  . Crohn disease ([place])   . Dizziness 11/21/2013  . Extensor tenosynovitis of wrist 12/24/2013  . Ganglion cyst of wrist 12/24/2013  . Hypotension, postural 11/22/2013  . Psoriasis   . Skin cancer     Past Surgical History:  Procedure Laterality Date  . BREAST BIOPSY Left 2003   needle bx-neg  . BREAST SURGERY Bilateral 2004   breast reduction  . CATARACT EXTRACTION W/PHACO Left 02/06/2018   Procedure: CATARACT EXTRACTION PHACO AND INTRAOCULAR LENS PLACEMENT (IOC);  Surgeon: Birder Robson, MD;  Location: [facility] ORS;  Service: Ophthalmology;  Laterality: Left;  [place] 00:31.5 CDE 3.49 Fluid pack Lot # [id number]  . COLON SURGERY   2004   colon removed  . CTR    . EXTRACORPOREAL SHOCK WAVE LITHOTRIPSY Right 02/26/2015   Procedure: EXTRACORPOREAL SHOCK WAVE LITHOTRIPSY (ESWL);  Surgeon: Collier Flowers, MD;  Location: [facility] ORS;  Service: Urology;  Laterality: Right;  . REDUCTION MAMMAPLASTY Bilateral 2005    Family History  Problem Relation Age of Onset  . Diabetes Father   . Diabetes Mother   . Heart disease Maternal Grandmother   . Breast cancer Neg Hx     Social History   Tobacco Use  . Smoking status: Former Research scientist (life sciences)  . Smokeless tobacco: Never Used  Substance Use Topics  . Alcohol use: Yes    Comment: occasional drink, once a month  . Drug use: No     Current Facility-Administered Medications:  .  aspirin chewable tablet 324 mg, 324 mg, Oral, Once, Melynda Ripple, MD  Current Outpatient Medications:  .  ciprofloxacin (CIPRO) 500 MG tablet, Take 1 tablet (500 mg total) by mouth 2 (two) times daily. (Patient taking differently: Take 500 mg by mouth 2 (two) times daily as needed (for flare ups). ), Disp: 20 tablet, Rfl: 0 .  estradiol (CLIMARA - DOSED IN MG/24 HR) 0.025 mg/24hr patch, PLACE 1 PATCH (0.025 MG TOTAL) ONTO THE SKIN ONCE A WEEK. (Patient taking differently: Place 0.025 mg onto the skin every Sunday. ), Disp: 12 patch, Rfl: 5 .  ibuprofen (ADVIL,MOTRIN) 200 MG tablet, Take 600 mg by mouth every  6 (six) hours as needed for headache or moderate pain., Disp: , Rfl:  .  loperamide (IMODIUM) 2 MG capsule, Take 4 mg by mouth 2 (two) times daily. , Disp: , Rfl:  .  progesterone (PROMETRIUM) 100 MG capsule, Take 1 capsule (100 mg total) by mouth daily. (Patient taking differently: Take 100 mg by mouth once a week. ), Disp: 30 capsule, Rfl: 1  Allergies  Allergen Reactions  . Morphine Other (See Comments)    Hallucination.  Marland Kitchen Penicillins Anaphylaxis and Other (See Comments)    Headaches per patient Has patient had a PCN reaction causing immediate rash, facial/tongue/throat swelling, SOB or  lightheadedness with hypotension: Unknown Has patient had a PCN reaction causing severe rash involving mucus membranes or skin necrosis: No Has patient had a PCN reaction that required hospitalization: No Has patient had a PCN reaction occurring within the last 10 years: No If all of the above answers are "NO", then may proceed with Cephalosporin use.   Santiago Bur [Nitrofurantoin Macrocrystal] Other (See Comments)    Unknown  . Benadryl [Diphenhydramine Hcl] Anxiety and Other (See Comments)    Per patient "craziness"     ROS  As noted in HPI.   Physical Exam  BP (!) 146/83 (BP Location: Left Arm)   Pulse 67   Temp 97.9 F (36.6 C) (Oral)   Resp 18   Ht 5\' 8"  (1.727 m)   Wt 79.8 kg   SpO2 100%   BMI 26.76 kg/m   Constitutional: Well developed, well nourished, no acute distress Eyes:  EOMI, conjunctiva normal bilaterally HENT: Normocephalic, atraumatic,mucus membranes moist Respiratory: Normal inspiratory effort.  Lungs clear bilaterally.  Good air movement.  No chest wall tenderness. Cardiovascular: Normal rate regular rhythm, no murmurs, rubs, gallops.  RP 2+ and equal bilaterally GI: nondistended soft, nontender, no rebound, guarding.  Active bowel sounds.  No pulsatile abdominal masses. skin: No rash, skin intact Musculoskeletal: Calves symmetric, nontender, no edema. Neurologic: Alert & oriented x 3, no focal neuro deficits Psychiatric: Speech and behavior appropriate   ED Course   Medications  aspirin chewable tablet 324 mg (has no administration in time range)    Orders Placed This Encounter  Procedures  . ED EKG    Standing Status:   Standing    Number of Occurrences:   1    Order Specific Question:   Reason for Exam    Answer:   Chest Pain  . Insert peripheral IV    Standing Status:   Standing    Number of Occurrences:   1    No results found for this or any previous visit (from the past 24 hour(s)). No results found.  ED Clinical  Impression  Chest pain, unspecified type   ED Assessment/Plan  Care everywhere records reviewed.  No mention of chest pain.  EKG: Normal sinus rhythm, rate 65.  Left axis deviation.  No hypertrophy.  No ST-T wave changes consistent with ischemia.  No changes from EKG from December 2015.  Of note, patient was symptomatic while the EKG was obtained.  While the patient has no EKG changes, normal vitals, her's story is concerning enough for atypical chest pain that I will be sending her to the [facility] ED for a comprehensive work-up.  Sending via EMS for cardiac monitoring on route.  Starting IV line, giving 324 mg of aspirin.  Discussed medical decision making, rationale for transfer to the ER.  Patient agrees with plan.  Meds ordered this encounter  Medications  . aspirin chewable tablet 324 mg    *This clinic note was created using [employer]. Therefore, there may be occasional mistakes despite careful proofreading.   ?   Melynda Ripple, MD 02/26/18 1609

## 2018-02-26 NOTE — ED Notes (Signed)
Pt offered ordered pain medication but she refused at this time. States she would rather not take anything for pain.

## 2018-02-28 DIAGNOSIS — I208 Other forms of angina pectoris: Secondary | ICD-10-CM | POA: Diagnosis not present

## 2018-02-28 DIAGNOSIS — E782 Mixed hyperlipidemia: Secondary | ICD-10-CM | POA: Diagnosis not present

## 2018-02-28 DIAGNOSIS — I10 Essential (primary) hypertension: Secondary | ICD-10-CM | POA: Insufficient documentation

## 2018-03-06 NOTE — H&P (Signed)
All labs reviewed. Abnormal studies sent to patients PCP when indicated.  Previous H&P reviewed, patient examined, there are NO CHANGES.   Porfilio11/26/201912:30 PM

## 2018-03-13 ENCOUNTER — Encounter: Payer: Self-pay | Admitting: Adult Health

## 2018-03-13 ENCOUNTER — Ambulatory Visit (INDEPENDENT_AMBULATORY_CARE_PROVIDER_SITE_OTHER): Payer: 59 | Admitting: Adult Health

## 2018-03-13 VITALS — BP 132/82 | HR 63 | Resp 16 | Ht 68.5 in | Wt 181.2 lb

## 2018-03-13 DIAGNOSIS — E663 Overweight: Secondary | ICD-10-CM

## 2018-03-13 DIAGNOSIS — R079 Chest pain, unspecified: Secondary | ICD-10-CM

## 2018-03-13 DIAGNOSIS — F411 Generalized anxiety disorder: Secondary | ICD-10-CM | POA: Diagnosis not present

## 2018-03-13 DIAGNOSIS — E2839 Other primary ovarian failure: Secondary | ICD-10-CM

## 2018-03-13 NOTE — Progress Notes (Signed)
[facility] [address], [place] [address]  Internal MEDICINE  Office Visit Note  Patient Name: Ruth Morales  [id number]  [id number]  Date of Service: 03/24/2018  Chief Complaint  Patient presents with  . Medical Management of Chronic Issues    weight loss management    HPI Patient is here for follow-up on medical management of weight loss.  She reports that after our last visit her brother passed away suddenly with a massive heart attack.  Since then she has not taken her phentermine because she is concerned about her own cardiovascular health.  She has even gone to urgent care for chest tightness.  Urgent care sent her to the emergency room where she was evaluated and her cardiac enzymes as well as other testing were all negative.  She will follow-up with cardiology to have a stress test performed at this week.  She states that most of this is probably anxiety due to her brothers untimely death.  However, she would more comfortable getting this taken care of and I am in total agrees with her.  She will no longer take the phentermine at this time until her cardiovascular fitness is deemed appropriate.  It is of note that she has gained 6 pounds since her last visit and I encouraged her that while she is off her medication she should be much to her about her diet and attempt to exercise as tolerated.     Current Medication: Outpatient Encounter Medications as of 03/13/2018  Medication Sig  . Calcium Citrate-Vitamin D (CALCIUM CITRATE + D3 MAXIMUM) 315-250 MG-UNIT TABS Take by mouth.  . ciprofloxacin (CIPRO) 500 MG tablet Take 1 tablet (500 mg total) by mouth 2 (two) times daily. (Patient taking differently: Take 500 mg by mouth 2 (two) times daily as needed (for flare ups). )  . estradiol (CLIMARA - DOSED IN MG/24 HR) 0.025 mg/24hr patch PLACE 1 PATCH (0.025 MG TOTAL) ONTO THE SKIN ONCE A WEEK. (Patient taking differently: Place 0.025 mg onto the skin every Sunday. )   . ibuprofen (ADVIL,MOTRIN) 200 MG tablet Take 600 mg by mouth every 6 (six) hours as needed for headache or moderate pain.  Marland Kitchen loperamide (IMODIUM) 2 MG capsule Take 4 mg by mouth 2 (two) times daily.   . progesterone (PROMETRIUM) 100 MG capsule Take 1 capsule (100 mg total) by mouth daily. (Patient taking differently: Take 100 mg by mouth once a week. )   No facility-administered encounter medications on file as of 03/13/2018.     Surgical History: Past Surgical History:  Procedure Laterality Date  . BREAST BIOPSY Left 2003   needle bx-neg  . BREAST SURGERY Bilateral 2004   breast reduction  . CATARACT EXTRACTION W/PHACO Left 02/06/2018   Procedure: CATARACT EXTRACTION PHACO AND INTRAOCULAR LENS PLACEMENT (IOC);  Surgeon: Birder Robson, MD;  Location: [facility] ORS;  Service: Ophthalmology;  Laterality: Left;  [place] 00:31.5 CDE 3.49 Fluid pack Lot # [id number]  . COLON SURGERY  2004   colon removed  . CTR    . EXTRACORPOREAL SHOCK WAVE LITHOTRIPSY Right 02/26/2015   Procedure: EXTRACORPOREAL SHOCK WAVE LITHOTRIPSY (ESWL);  Surgeon: Collier Flowers, MD;  Location: [facility] ORS;  Service: Urology;  Laterality: Right;  . REDUCTION MAMMAPLASTY Bilateral 2005    Medical History: Past Medical History:  Diagnosis Date  . Anxiety 11/21/2013  . Breath shortness 11/21/2013  . Crohn disease ([place])   . Dizziness 11/21/2013  . Extensor tenosynovitis of wrist 12/24/2013  .  Ganglion cyst of wrist 12/24/2013  . Hypotension, postural 11/22/2013  . Psoriasis   . Skin cancer     Family History: Family History  Problem Relation Age of Onset  . Diabetes Father   . Diabetes Mother   . Heart disease Maternal Grandmother   . Breast cancer Neg Hx     Social History   Socioeconomic History  . Marital status: Married    Spouse name: Not on file  . Number of children: Not on file  . Years of education: Not on file  . Highest education level: Not on file  Occupational History  . Not on file  Social  Needs  . Financial resource strain: Not on file  . Food insecurity:    Worry: Not on file    Inability: Not on file  . Transportation needs:    Medical: Not on file    Non-medical: Not on file  Tobacco Use  . Smoking status: Former Research scientist (life sciences)  . Smokeless tobacco: Never Used  Substance and Sexual Activity  . Alcohol use: Yes    Comment: occasional drink, once a month  . Drug use: No  . Sexual activity: Yes    Partners: Male  Lifestyle  . Physical activity:    Days per week: Not on file    Minutes per session: Not on file  . Stress: Not on file  Relationships  . Social connections:    Talks on phone: Not on file    Gets together: Not on file    Attends religious service: Not on file    Active member of club or organization: Not on file    Attends meetings of clubs or organizations: Not on file    Relationship status: Not on file  . Intimate partner violence:    Fear of current or ex partner: Not on file    Emotionally abused: Not on file    Physically abused: Not on file    Forced sexual activity: Not on file  Other Topics Concern  . Not on file  Social History Narrative  . Not on file      Review of Systems  Constitutional: Negative for chills, fatigue and unexpected weight change.  HENT: Negative for congestion, rhinorrhea, sneezing and sore throat.   Eyes: Negative for photophobia, pain and redness.  Respiratory: Negative for cough, chest tightness and shortness of breath.   Cardiovascular: Negative for chest pain and palpitations.  Gastrointestinal: Negative for abdominal pain, constipation, diarrhea, nausea and vomiting.  Endocrine: Negative.   Genitourinary: Negative for dysuria and frequency.  Musculoskeletal: Negative for arthralgias, back pain, joint swelling and neck pain.  Skin: Negative for rash.  Allergic/Immunologic: Negative.   Neurological: Negative for tremors and numbness.  Hematological: Negative for adenopathy. Does not bruise/bleed easily.   Psychiatric/Behavioral: Negative for behavioral problems and sleep disturbance. The patient is not nervous/anxious.     Vital Signs: BP 132/82 (BP Location: Right Arm, Patient Position: Sitting, Cuff Size: Normal)   Pulse 63   Resp 16   Ht 5' 8.5" (1.74 m)   Wt 181 lb 3.2 oz (82.2 kg)   SpO2 96%   BMI 27.15 kg/m    Physical Exam  Constitutional: She is oriented to person, place, and time. She appears well-developed and well-nourished. No distress.  HENT:  Head: Normocephalic and atraumatic.  Mouth/Throat: Oropharynx is clear and moist. No oropharyngeal exudate.  Eyes: Pupils are equal, round, and reactive to light. EOM are normal.  Neck: Normal range of  motion. Neck supple. No JVD present. No tracheal deviation present. No thyromegaly present.  Cardiovascular: Normal rate, regular rhythm and normal heart sounds. Exam reveals no gallop and no friction rub.  No murmur heard. Pulmonary/Chest: Effort normal and breath sounds normal. No respiratory distress. She has no wheezes. She has no rales. She exhibits no tenderness.  Abdominal: Soft. There is no tenderness. There is no guarding.  Musculoskeletal: Normal range of motion.  Lymphadenopathy:    She has no cervical adenopathy.  Neurological: She is alert and oriented to person, place, and time. No cranial nerve deficit.  Skin: Skin is warm and dry. She is not diaphoretic.  Psychiatric: She has a normal mood and affect. Her behavior is normal. Judgment and thought content normal.  Nursing note and vitals reviewed.  Assessment/Plan: 1. Generalized anxiety disorder Patient's anxiety is heightened in the few weeks since her brothers death.  She is continuing to manage this and I have encouraged her to continue her medications as prescribed.  2. Overweight Obesity Counseling: Risk Assessment: An assessment of behavioral risk factors was made today and includes lack of exercise sedentary lifestyle, lack of portion control and poor  dietary habits.  Risk Modification Advice: She was counseled on portion control guidelines. Restricting daily caloric intake to. . The detrimental long term effects of obesity on her health and ongoing poor compliance was also discussed with the patient.  We will hold phentermine at this time and we we will discuss Saxenda in the future for weight loss.  3. Chest pain in adult Patient is having chest pain evaluated by cardiology in the coming week.  Acutely she was seen in the emergency department as well as urgent care and acute issues were ruled out.  4. Ovarian failure We discussed the hormones she currently takes.  She uses the estradiol patch weekly and is typically good about using this.  However she has been reading since her brother died that hormones can have effect on cardiovascular issues.  She would like to stop her hormone free treatment at this time.  General Counseling: Lylah verbalizes understanding of the findings of todays visit and agrees with plan of treatment. I have discussed any further diagnostic evaluation that may be needed or ordered today. We also reviewed her medications today. she has been encouraged to call the office with any questions or concerns that should arise related to todays visit.    No orders of the defined types were placed in this encounter.   No orders of the defined types were placed in this encounter.   Time spent: 25 Minutes   This patient was seen by Orson Gear AGNP-C in Collaboration with Dr Lavera Guise as a part of collaborative care agreement     Kendell Bane AGNP-C Internal medicine

## 2018-03-13 NOTE — H&P (Signed)
All labs reviewed. Abnormal studies sent to patients PCP when indicated.  Previous H&P reviewed, patient examined, there are NO CHANGES.   Porfilio12/3/20197:16 AM  

## 2018-03-13 NOTE — Patient Instructions (Signed)
Stress and Stress Management Stress is a normal reaction to life events. It is what you feel when life demands more than you are used to or more than you can handle. Some stress can be useful. For example, the stress reaction can help you catch the last bus of the day, study for a test, or meet a deadline at work. But stress that occurs too often or for too long can cause problems. It can affect your emotional health and interfere with relationships and normal daily activities. Too much stress can weaken your immune system and increase your risk for physical illness. If you already have a medical problem, stress can make it worse. What are the causes? All sorts of life events may cause stress. An event that causes stress for one person may not be stressful for another person. Major life events commonly cause stress. These may be positive or negative. Examples include losing your job, moving into a new home, getting married, having a baby, or losing a loved one. Less obvious life events may also cause stress, especially if they occur day after day or in combination. Examples include working long hours, driving in traffic, caring for children, being in debt, or being in a difficult relationship. What are the signs or symptoms? Stress may cause emotional symptoms including, the following:  Anxiety. This is feeling worried, afraid, on edge, overwhelmed, or out of control.  Anger. This is feeling irritated or impatient.  Depression. This is feeling sad, down, helpless, or guilty.  Difficulty focusing, remembering, or making decisions.  Stress may cause physical symptoms, including the following:  Aches and pains. These may affect your head, neck, back, stomach, or other areas of your body.  Tight muscles or clenched jaw.  Low energy or trouble sleeping.  Stress may cause unhealthy behaviors, including the following:  Eating to feel better (overeating) or skipping meals.  Sleeping too little,  too much, or both.  Working too much or putting off tasks (procrastination).  Smoking, drinking alcohol, or using drugs to feel better.  How is this diagnosed? Stress is diagnosed through an assessment by your health care provider. Your health care provider will ask questions about your symptoms and any stressful life events.Your health care provider will also ask about your medical history and may order blood tests or other tests. Certain medical conditions and medicine can cause physical symptoms similar to stress. Mental illness can cause emotional symptoms and unhealthy behaviors similar to stress. Your health care provider may refer you to a mental health professional for further evaluation. How is this treated? Stress management is the recommended treatment for stress.The goals of stress management are reducing stressful life events and coping with stress in healthy ways. Techniques for reducing stressful life events include the following:  Stress identification. Self-monitor for stress and identify what causes stress for you. These skills may help you to avoid some stressful events.  Time management. Set your priorities, keep a calendar of events, and learn to say "no." These tools can help you avoid making too many commitments.  Techniques for coping with stress include the following:  Rethinking the problem. Try to think realistically about stressful events rather than ignoring them or overreacting. Try to find the positives in a stressful situation rather than focusing on the negatives.  Exercise. Physical exercise can release both physical and emotional tension. The key is to find a form of exercise you enjoy and do it regularly.  Relaxation techniques. These relax the body and  mind. Examples include yoga, meditation, tai chi, biofeedback, deep breathing, progressive muscle relaxation, listening to music, being out in nature, journaling, and other hobbies. Again, the key is to find  one or more that you enjoy and can do regularly.  Healthy lifestyle. Eat a balanced diet, get plenty of sleep, and do not smoke. Avoid using alcohol or drugs to relax.  Strong support network. Spend time with family, friends, or other people you enjoy being around.Express your feelings and talk things over with someone you trust.  Counseling or talktherapy with a mental health professional may be helpful if you are having difficulty managing stress on your own. Medicine is typically not recommended for the treatment of stress.Talk to your health care provider if you think you need medicine for symptoms of stress. Follow these instructions at home:  Keep all follow-up visits as directed by your health care provider.  Take all medicines as directed by your health care provider. Contact a health care provider if:  Your symptoms get worse or you start having new symptoms.  You feel overwhelmed by your problems and can no longer manage them on your own. Get help right away if:  You feel like hurting yourself or someone else. This information is not intended to replace advice given to you by your health care provider. Make sure you discuss any questions you have with your health care provider. Document Released: 09/21/2000 Document Revised: 09/03/2015 Document Reviewed: 11/20/2012 Elsevier Interactive Patient Education  2017 Elsevier Inc.  

## 2018-03-29 DIAGNOSIS — I208 Other forms of angina pectoris: Secondary | ICD-10-CM | POA: Diagnosis not present

## 2018-04-02 DIAGNOSIS — E782 Mixed hyperlipidemia: Secondary | ICD-10-CM | POA: Diagnosis not present

## 2018-04-02 DIAGNOSIS — I10 Essential (primary) hypertension: Secondary | ICD-10-CM | POA: Diagnosis not present

## 2018-04-02 DIAGNOSIS — R0789 Other chest pain: Secondary | ICD-10-CM | POA: Diagnosis not present

## 2018-04-23 DIAGNOSIS — L570 Actinic keratosis: Secondary | ICD-10-CM | POA: Diagnosis not present

## 2018-05-10 ENCOUNTER — Encounter: Payer: Self-pay | Admitting: Adult Health

## 2018-05-10 ENCOUNTER — Ambulatory Visit (INDEPENDENT_AMBULATORY_CARE_PROVIDER_SITE_OTHER): Payer: 59 | Admitting: Adult Health

## 2018-05-10 VITALS — BP 158/88 | HR 72 | Resp 16 | Ht 68.5 in | Wt 181.0 lb

## 2018-05-10 DIAGNOSIS — F411 Generalized anxiety disorder: Secondary | ICD-10-CM

## 2018-05-10 DIAGNOSIS — R030 Elevated blood-pressure reading, without diagnosis of hypertension: Secondary | ICD-10-CM

## 2018-05-10 DIAGNOSIS — E663 Overweight: Secondary | ICD-10-CM | POA: Diagnosis not present

## 2018-05-10 MED ORDER — LIRAGLUTIDE -WEIGHT MANAGEMENT 18 MG/3ML ~~LOC~~ SOPN: 1.2000 mg | PEN_INJECTOR | Freq: Every day | SUBCUTANEOUS | 1 refills | Status: DC

## 2018-05-10 NOTE — Patient Instructions (Signed)

## 2018-05-10 NOTE — Progress Notes (Signed)
[facility] [place], [place] [address]  Internal MEDICINE  Office Visit Note  Patient Name: Ruth Morales  [id number]  [id number]  Date of Service: 05/27/2018  Chief Complaint  Patient presents with  . Anxiety  . Hypertension    elevated bp   . Medical Management of Chronic Issues    8 week follow up    HPI  Patient is here for follow-up on anxiety and medical management of weight loss.  At last visit patient had a long discussion about her using phentermine given the recent events with her brother passing away suddenly.  She continues to be uncomfortable with the idea of phentermine however is can still concerned about losing weight.  Her weight has not changed in the 2 months since her last appointment she remains 181 pounds.  We discussed some other options especially given that her blood pressure today in the office was 158/88.  Patient ports she has been anxious about if she had lost any weight or gained any more weight and she is contributing her increased blood pressure to this.  She denies any headache, chest pain, shortness of breath or any other symptom.   Current Medication: Outpatient Encounter Medications as of 05/10/2018  Medication Sig  . Calcium Citrate-Vitamin D (CALCIUM CITRATE + D3 MAXIMUM) 315-250 MG-UNIT TABS Take by mouth.  Marland Kitchen ibuprofen (ADVIL,MOTRIN) 200 MG tablet Take 600 mg by mouth every 6 (six) hours as needed for headache or moderate pain.  Marland Kitchen loperamide (IMODIUM) 2 MG capsule Take 4 mg by mouth 2 (two) times daily.   . Liraglutide -Weight Management (SAXENDA) 18 MG/3ML SOPN Inject 1.2 mg into the skin daily.  . [DISCONTINUED] ciprofloxacin (CIPRO) 500 MG tablet Take 1 tablet (500 mg total) by mouth 2 (two) times daily. (Patient not taking: Reported on 05/10/2018)  . [DISCONTINUED] estradiol (CLIMARA - DOSED IN MG/24 HR) 0.025 mg/24hr patch PLACE 1 PATCH (0.025 MG TOTAL) ONTO THE SKIN ONCE A WEEK. (Patient not taking: Reported on  05/10/2018)  . [DISCONTINUED] progesterone (PROMETRIUM) 100 MG capsule Take 1 capsule (100 mg total) by mouth daily. (Patient not taking: Reported on 05/10/2018)   No facility-administered encounter medications on file as of 05/10/2018.     Surgical History: Past Surgical History:  Procedure Laterality Date  . BREAST BIOPSY Left 2003   needle bx-neg  . BREAST SURGERY Bilateral 2004   breast reduction  . CATARACT EXTRACTION W/PHACO Left 02/06/2018   Procedure: CATARACT EXTRACTION PHACO AND INTRAOCULAR LENS PLACEMENT (IOC);  Surgeon: Birder Robson, MD;  Location: [facility] ORS;  Service: Ophthalmology;  Laterality: Left;  [place] 00:31.5 CDE 3.49 Fluid pack Lot # [id number]  . COLON SURGERY  2004   colon removed  . CTR    . EXTRACORPOREAL SHOCK WAVE LITHOTRIPSY Right 02/26/2015   Procedure: EXTRACORPOREAL SHOCK WAVE LITHOTRIPSY (ESWL);  Surgeon: Collier Flowers, MD;  Location: [facility] ORS;  Service: Urology;  Laterality: Right;  . REDUCTION MAMMAPLASTY Bilateral 2005    Medical History: Past Medical History:  Diagnosis Date  . Anxiety 11/21/2013  . Breath shortness 11/21/2013  . Crohn disease ([place])   . Dizziness 11/21/2013  . Extensor tenosynovitis of wrist 12/24/2013  . Ganglion cyst of wrist 12/24/2013  . Hypotension, postural 11/22/2013  . Psoriasis   . Skin cancer     Family History: Family History  Problem Relation Age of Onset  . Diabetes Father   . Diabetes Mother   . Heart disease Maternal Grandmother   .  Breast cancer Neg Hx     Social History   Socioeconomic History  . Marital status: Married    Spouse name: Not on file  . Number of children: Not on file  . Years of education: Not on file  . Highest education level: Not on file  Occupational History  . Not on file  Social Needs  . Financial resource strain: Not on file  . Food insecurity:    Worry: Not on file    Inability: Not on file  . Transportation needs:    Medical: Not on file    Non-medical: Not on file   Tobacco Use  . Smoking status: Former Research scientist (life sciences)  . Smokeless tobacco: Never Used  Substance and Sexual Activity  . Alcohol use: Yes    Comment: occasional drink, once a month  . Drug use: No  . Sexual activity: Yes    Partners: Male  Lifestyle  . Physical activity:    Days per week: Not on file    Minutes per session: Not on file  . Stress: Not on file  Relationships  . Social connections:    Talks on phone: Not on file    Gets together: Not on file    Attends religious service: Not on file    Active member of club or organization: Not on file    Attends meetings of clubs or organizations: Not on file    Relationship status: Not on file  . Intimate partner violence:    Fear of current or ex partner: Not on file    Emotionally abused: Not on file    Physically abused: Not on file    Forced sexual activity: Not on file  Other Topics Concern  . Not on file  Social History Narrative  . Not on file      Review of Systems  Constitutional: Negative for chills, fatigue and unexpected weight change.  HENT: Negative for congestion, rhinorrhea, sneezing and sore throat.   Eyes: Negative for photophobia, pain and redness.  Respiratory: Negative for cough, chest tightness and shortness of breath.   Cardiovascular: Negative for chest pain and palpitations.  Gastrointestinal: Negative for abdominal pain, constipation, diarrhea, nausea and vomiting.  Endocrine: Negative.   Genitourinary: Negative for dysuria and frequency.  Musculoskeletal: Negative for arthralgias, back pain, joint swelling and neck pain.  Skin: Negative for rash.  Allergic/Immunologic: Negative.   Neurological: Negative for tremors and numbness.  Hematological: Negative for adenopathy. Does not bruise/bleed easily.  Psychiatric/Behavioral: Negative for behavioral problems and sleep disturbance. The patient is not nervous/anxious.     Vital Signs: BP (!) 158/88   Pulse 72   Resp 16   Ht 5' 8.5" (1.74 m)   Wt  181 lb (82.1 kg)   SpO2 96%   BMI 27.12 kg/m    Physical Exam Vitals signs and nursing note reviewed.  Constitutional:      General: She is not in acute distress.    Appearance: She is well-developed. She is not diaphoretic.  HENT:     Head: Normocephalic and atraumatic.     Mouth/Throat:     Pharynx: No oropharyngeal exudate.  Eyes:     Pupils: Pupils are equal, round, and reactive to light.  Neck:     Musculoskeletal: Normal range of motion and neck supple.     Thyroid: No thyromegaly.     Vascular: No JVD.     Trachea: No tracheal deviation.  Cardiovascular:     Rate and Rhythm:  Normal rate and regular rhythm.     Heart sounds: Normal heart sounds. No murmur. No friction rub. No gallop.   Pulmonary:     Effort: Pulmonary effort is normal. No respiratory distress.     Breath sounds: Normal breath sounds. No wheezing or rales.  Chest:     Chest wall: No tenderness.  Abdominal:     Palpations: Abdomen is soft.     Tenderness: There is no abdominal tenderness. There is no guarding.  Musculoskeletal: Normal range of motion.  Lymphadenopathy:     Cervical: No cervical adenopathy.  Skin:    General: Skin is warm and dry.  Neurological:     Mental Status: She is alert and oriented to person, place, and time.     Cranial Nerves: No cranial nerve deficit.  Psychiatric:        Behavior: Behavior normal.        Thought Content: Thought content normal.        Judgment: Judgment normal.    Assessment/Plan: 1. Generalized anxiety disorder Patient reports her anxiety is controllable at this time.  She denies need for medication or referral to psychiatry.  She feels like she is in a good place.  2. Elevated blood pressure reading BP slightly elevated today.  Patient was asked about possibly gaining weight at this visit.  She is still tearful at times when speaking about her brother.  We will continue to monitor blood pressure after visits.  3. Overweight Given patient sample  of Saxenda instructed her on use.  She denies any questions at this time.  Prescription was sent to her pharmacy to see what the cost would be for her she will call us back if this is too expensive or if she has any side effects from the sample. Obesity Counseling: Risk Assessment: An assessment of behavioral risk factors was made today and includes lack of exercise sedentary lifestyle, lack of portion control and poor dietary habits.  Risk Modification Advice: She was counseled on portion control guidelines. Restricting daily caloric intake to. . The detrimental long term effects of obesity on her health and ongoing poor compliance was also discussed with the patient.   General Counseling: Larhonda verbalizes understanding of the findings of todays visit and agrees with plan of treatment. I have discussed any further diagnostic evaluation that may be needed or ordered today. We also reviewed her medications today. she has been encouraged to call the office with any questions or concerns that should arise related to todays visit.    No orders of the defined types were placed in this encounter.   Meds ordered this encounter  Medications  . Liraglutide -Weight Management (SAXENDA) 18 MG/3ML SOPN    Sig: Inject 1.2 mg into the skin daily.    Dispense:  5 pen    Refill:  1    Time spent: 25 Minutes   This patient was seen by Orson Gear AGNP-C in Collaboration with Dr Lavera Guise as a part of collaborative care agreement     Kendell Bane AGNP-C Internal medicine

## 2018-05-27 ENCOUNTER — Encounter: Payer: Self-pay | Admitting: Adult Health

## 2018-05-28 ENCOUNTER — Telehealth: Payer: Self-pay

## 2018-05-28 ENCOUNTER — Other Ambulatory Visit: Payer: Self-pay

## 2018-05-28 MED ORDER — CYANOCOBALAMIN 1000 MCG/ML IJ SOLN: 1000.0000 ug | INTRAMUSCULAR | 3 refills | Status: DC

## 2018-05-28 MED ORDER — INSULIN PEN NEEDLE 32G X 4 MM MISC: 1 refills | Status: DC

## 2018-05-28 MED ORDER — "SYRINGE 25G X 5/8"" 3 ML MISC": 3 refills | Status: DC

## 2018-05-28 NOTE — Telephone Encounter (Signed)
As per adam send b12 injection,syringes and also pen needled for saxenda

## 2018-07-12 DIAGNOSIS — L905 Scar conditions and fibrosis of skin: Secondary | ICD-10-CM | POA: Diagnosis not present

## 2018-07-12 DIAGNOSIS — L814 Other melanin hyperpigmentation: Secondary | ICD-10-CM | POA: Diagnosis not present

## 2018-07-12 DIAGNOSIS — L739 Follicular disorder, unspecified: Secondary | ICD-10-CM | POA: Diagnosis not present

## 2018-07-12 DIAGNOSIS — D485 Neoplasm of uncertain behavior of skin: Secondary | ICD-10-CM | POA: Diagnosis not present

## 2018-07-23 ENCOUNTER — Ambulatory Visit: Payer: Self-pay | Admitting: Nurse Practitioner

## 2018-08-09 ENCOUNTER — Other Ambulatory Visit: Payer: Self-pay

## 2018-08-09 ENCOUNTER — Encounter: Payer: Self-pay | Admitting: Adult Health

## 2018-08-09 ENCOUNTER — Ambulatory Visit: Payer: 59 | Admitting: Adult Health

## 2018-08-09 VITALS — BP 116/70 | HR 72 | Resp 16 | Ht 68.5 in | Wt 184.0 lb

## 2018-08-09 DIAGNOSIS — E663 Overweight: Secondary | ICD-10-CM

## 2018-08-09 DIAGNOSIS — E559 Vitamin D deficiency, unspecified: Secondary | ICD-10-CM

## 2018-08-09 DIAGNOSIS — R030 Elevated blood-pressure reading, without diagnosis of hypertension: Secondary | ICD-10-CM

## 2018-08-09 DIAGNOSIS — F411 Generalized anxiety disorder: Secondary | ICD-10-CM | POA: Diagnosis not present

## 2018-08-09 MED ORDER — PHENTERMINE HCL 37.5 MG PO CAPS: 37.5000 mg | ORAL_CAPSULE | ORAL | 0 refills | Status: DC

## 2018-08-09 MED ORDER — VITAMIN D (ERGOCALCIFEROL) 1.25 MG (50000 UNIT) PO CAPS: 50000.0000 [IU] | ORAL_CAPSULE | ORAL | 0 refills | Status: DC

## 2018-08-09 NOTE — Progress Notes (Signed)
[facility] [place], [place] [address]  Internal MEDICINE  Telephone Visit  Patient Name: Ruth Morales  [id number]  [id number]  Date of Service: 08/09/2018  I connected with the patient at 904 by telephone and verified the patients identity using two identifiers.  I discussed the limitations, risks, security and privacy concerns of performing an evaluation and management service by telephone and the availability of in person appointments. I also discussed with the patient that there may be a patient responsible charge related to the service.  The patient expressed understanding and agrees to proceed.    Chief Complaint  Patient presents with  . Telephone Screen  . Medical Management of Chronic Issues    3 MONTH FOLLOW UP  . Telephone Assessment    HPI  Pt has televisit for weight loss.  She reports on her home scale she is down to 184lbs, This is a good weight loss for her since starting the saxsenda. She does however mention that she would like to go back on the phentermine now that she has been off for a few months.  She feels like her blood pressure is well controlled, and she denies any other issues.     Current Medication: Outpatient Encounter Medications as of 08/09/2018  Medication Sig  . Calcium Citrate-Vitamin D (CALCIUM CITRATE + D3 MAXIMUM) 315-250 MG-UNIT TABS Take by mouth.  . cyanocobalamin (,VITAMIN B-12,) 1000 MCG/ML injection Inject 1 mL (1,000 mcg total) into the muscle every 30 (thirty) days.  Marland Kitchen ibuprofen (ADVIL,MOTRIN) 200 MG tablet Take 600 mg by mouth every 6 (six) hours as needed for headache or moderate pain.  . [DISCONTINUED] Liraglutide -Weight Management (SAXENDA) 18 MG/3ML SOPN Inject 1.2 mg into the skin daily.  . [DISCONTINUED] Syringe/Needle, Disp, (SYRINGE 3CC/25GX5/8") 25G X 5/8" 3 ML MISC Use as directed with b12 injection once a month  . Insulin Pen Needle (BD PEN NEEDLE NANO U/F) 32G X 4 MM MISC Use WITH SAXENDA  .  loperamide (IMODIUM) 2 MG capsule Take 4 mg by mouth 2 (two) times daily.   . phentermine 37.5 MG capsule Take 1 capsule (37.5 mg total) by mouth every morning.  . Vitamin D, Ergocalciferol, (DRISDOL) 1.25 MG (50000 UT) CAPS capsule Take 1 capsule (50,000 Units total) by mouth every 7 (seven) days.   No facility-administered encounter medications on file as of 08/09/2018.     Surgical History: Past Surgical History:  Procedure Laterality Date  . BREAST BIOPSY Left 2003   needle bx-neg  . BREAST SURGERY Bilateral 2004   breast reduction  . CATARACT EXTRACTION W/PHACO Left 02/06/2018   Procedure: CATARACT EXTRACTION PHACO AND INTRAOCULAR LENS PLACEMENT (IOC);  Surgeon: Birder Robson, MD;  Location: [facility] ORS;  Service: Ophthalmology;  Laterality: Left;  [place] 00:31.5 CDE 3.49 Fluid pack Lot # [id number]  . COLON SURGERY  2004   colon removed  . CTR    . EXTRACORPOREAL SHOCK WAVE LITHOTRIPSY Right 02/26/2015   Procedure: EXTRACORPOREAL SHOCK WAVE LITHOTRIPSY (ESWL);  Surgeon: Collier Flowers, MD;  Location: [facility] ORS;  Service: Urology;  Laterality: Right;  . REDUCTION MAMMAPLASTY Bilateral 2005    Medical History: Past Medical History:  Diagnosis Date  . Anxiety 11/21/2013  . Breath shortness 11/21/2013  . Crohn disease ([place])   . Dizziness 11/21/2013  . Extensor tenosynovitis of wrist 12/24/2013  . Ganglion cyst of wrist 12/24/2013  . Hypotension, postural 11/22/2013  . Psoriasis   . Skin cancer     Family  History: Family History  Problem Relation Age of Onset  . Diabetes Father   . Diabetes Mother   . Heart disease Maternal Grandmother   . Breast cancer Neg Hx     Social History   Socioeconomic History  . Marital status: Married    Spouse name: Not on file  . Number of children: Not on file  . Years of education: Not on file  . Highest education level: Not on file  Occupational History  . Not on file  Social Needs  . Financial resource strain: Not on file  . Food  insecurity:    Worry: Not on file    Inability: Not on file  . Transportation needs:    Medical: Not on file    Non-medical: Not on file  Tobacco Use  . Smoking status: Former Research scientist (life sciences)  . Smokeless tobacco: Never Used  Substance and Sexual Activity  . Alcohol use: Yes    Comment: occasional drink, once a month  . Drug use: No  . Sexual activity: Yes    Partners: Male  Lifestyle  . Physical activity:    Days per week: Not on file    Minutes per session: Not on file  . Stress: Not on file  Relationships  . Social connections:    Talks on phone: Not on file    Gets together: Not on file    Attends religious service: Not on file    Active member of club or organization: Not on file    Attends meetings of clubs or organizations: Not on file    Relationship status: Not on file  . Intimate partner violence:    Fear of current or ex partner: Not on file    Emotionally abused: Not on file    Physically abused: Not on file    Forced sexual activity: Not on file  Other Topics Concern  . Not on file  Social History Narrative  . Not on file      Review of Systems  Constitutional: Negative for chills, fatigue and unexpected weight change.  HENT: Negative for congestion, rhinorrhea, sneezing and sore throat.   Eyes: Negative for photophobia, pain and redness.  Respiratory: Negative for cough, chest tightness and shortness of breath.   Cardiovascular: Negative for chest pain and palpitations.  Gastrointestinal: Negative for abdominal pain, constipation, diarrhea, nausea and vomiting.  Endocrine: Negative.   Genitourinary: Negative for dysuria and frequency.  Musculoskeletal: Negative for arthralgias, back pain, joint swelling and neck pain.  Skin: Negative for rash.  Allergic/Immunologic: Negative.   Neurological: Negative for tremors and numbness.  Hematological: Negative for adenopathy. Does not bruise/bleed easily.  Psychiatric/Behavioral: Negative for behavioral problems and  sleep disturbance. The patient is not nervous/anxious.     Vital Signs: BP 116/70   Pulse 72   Resp 16   Ht 5' 8.5" (1.74 m)   Wt 184 lb (83.5 kg)   BMI 27.57 kg/m    Observation/Objective:  Well appearing, pleasant affect, cooperative.  Speaking in full sentences.    Assessment/Plan: 1. Elevated blood pressure reading Resolved.  PTs blood pressure wnl and she reports no elevated pressures since last visit.   2. Overweight Obesity Counseling: Risk Assessment: An assessment of behavioral risk factors was made today and includes lack of exercise sedentary lifestyle, lack of portion control and poor dietary habits.  Risk Modification Advice: She was counseled on portion control guidelines. Restricting daily caloric intake to. . The detrimental long term effects of obesity on  her health and ongoing poor compliance was also discussed with the patient.  - phentermine 37.5 MG capsule; Take 1 capsule (37.5 mg total) by mouth every morning.  Dispense: 30 capsule; Refill: 0  3. Generalized anxiety disorder Stable, NAD.  PT denies issues recently. Well controlled.  4. Vitamin D deficiency Refilled Drisdol.  Will recheck level in 10 weeks.  - Vitamin D, Ergocalciferol, (DRISDOL) 1.25 MG (50000 UT) CAPS capsule; Take 1 capsule (50,000 Units total) by mouth every 7 (seven) days.  Dispense: 10 capsule; Refill: 0  General Counseling: Sherita verbalizes understanding of the findings of today's phone visit and agrees with plan of treatment. I have discussed any further diagnostic evaluation that may be needed or ordered today. We also reviewed her medications today. she has been encouraged to call the office with any questions or concerns that should arise related to todays visit.    No orders of the defined types were placed in this encounter.   Meds ordered this encounter  Medications  . Vitamin D, Ergocalciferol, (DRISDOL) 1.25 MG (50000 UT) CAPS capsule    Sig: Take 1 capsule (50,000  Units total) by mouth every 7 (seven) days.    Dispense:  10 capsule    Refill:  0  . phentermine 37.5 MG capsule    Sig: Take 1 capsule (37.5 mg total) by mouth every morning.    Dispense:  30 capsule    Refill:  0    Time spent:11 Minutes    Orson Gear AGNP-C Internal medicine

## 2018-08-09 NOTE — Patient Instructions (Signed)
Phentermine tablets or capsules What is this medicine? PHENTERMINE (FEN ter meen) decreases your appetite. It is used with a reduced calorie diet and exercise to help you lose weight. This medicine may be used for other purposes; ask your health care provider or pharmacist if you have questions. COMMON BRAND NAME(S): Adipex-P, Atti-Plex P, Atti-Plex P Spansule, Fastin, Lomaira, Pro-Fast, Tara-8 What should I tell my health care provider before I take this medicine? They need to know if you have any of these conditions: -agitation or nervousness -diabetes -glaucoma -heart disease -high blood pressure -history of drug abuse or addiction -history of stroke -kidney disease -lung disease called Primary Pulmonary Hypertension (PPH) -taken an MAOI like Carbex, Eldepryl, Marplan, Nardil, or Parnate in last 14 days -taking stimulant medicines for attention disorders, weight loss, or to stay awake -thyroid disease -an unusual or allergic reaction to phentermine, other medicines, foods, dyes, or preservatives -pregnant or trying to get pregnant -breast-feeding How should I use this medicine? Take this medicine by mouth with a glass of water. Follow the directions on the prescription label. The instructions for use may differ based on the product and dose you are taking. Avoid taking this medicine in the evening. It may interfere with sleep. Take your doses at regular intervals. Do not take your medicine more often than directed. Talk to your pediatrician regarding the use of this medicine in children. While this drug may be prescribed for children 17 years or older for selected conditions, precautions do apply. Overdosage: If you think you have taken too much of this medicine contact a poison control center or emergency room at once. NOTE: This medicine is only for you. Do not share this medicine with others. What if I miss a dose? If you miss a dose, take it as soon as you can. If it is almost time  for your next dose, take only that dose. Do not take double or extra doses. What may interact with this medicine? Do not take this medicine with any of the following medications: -MAOIs like Carbex, Eldepryl, Marplan, Nardil, and Parnate -medicines for colds or breathing difficulties like pseudoephedrine or phenylephrine -procarbazine -sibutramine -stimulant medicines for attention disorders, weight loss, or to stay awake This medicine may also interact with the following medications: -certain medicines for depression, anxiety, or psychotic disturbances -linezolid -medicines for diabetes -medicines for high blood pressure This list may not describe all possible interactions. Give your health care provider a list of all the medicines, herbs, non-prescription drugs, or dietary supplements you use. Also tell them if you smoke, drink alcohol, or use illegal drugs. Some items may interact with your medicine. What should I watch for while using this medicine? Notify your physician immediately if you become short of breath while doing your normal activities. Do not take this medicine within 6 hours of bedtime. It can keep you from getting to sleep. Avoid drinks that contain caffeine and try to stick to a regular bedtime every night. This medicine was intended to be used in addition to a healthy diet and exercise. The best results are achieved this way. This medicine is only indicated for short-term use. Eventually your weight loss may level out. At that point, the drug will only help you maintain your new weight. Do not increase or in any way change your dose without consulting your doctor. You may get drowsy or dizzy. Do not drive, use machinery, or do anything that needs mental alertness until you know how this medicine affects you. Do   not stand or sit up quickly, especially if you are an older patient. This reduces the risk of dizzy or fainting spells. Alcohol may increase dizziness and drowsiness.  Avoid alcoholic drinks. What side effects may I notice from receiving this medicine? Side effects that you should report to your doctor or health care professional as soon as possible: -allergic reactions like skin rash, itching or hives, swelling of the face, lips, or tongue) -anxiety -breathing problems -changes in vision -chest pain or chest tightness -depressed mood or other mood changes -hallucinations, loss of contact with reality -fast, irregular heartbeat -increased blood pressure -irritable -nervousness or restlessness -painful urination -palpitations -tremors -trouble sleeping -seizures -signs and symptoms of a stroke like changes in vision; confusion; trouble speaking or understanding; severe headaches; sudden numbness or weakness of the face, arm or leg; trouble walking; dizziness; loss of balance or coordination -unusually weak or tired -vomiting Side effects that usually do not require medical attention (report to your doctor or health care professional if they continue or are bothersome): -constipation or diarrhea -dry mouth -headache -nausea -stomach upset -sweating This list may not describe all possible side effects. Call your doctor for medical advice about side effects. You may report side effects to FDA at 1-800-FDA-1088. Where should I keep my medicine? Keep out of the reach of children. This medicine can be abused. Keep your medicine in a safe place to protect it from theft. Do not share this medicine with anyone. Selling or giving away this medicine is dangerous and against the law. This medicine may cause accidental overdose and death if taken by other adults, children, or pets. Mix any unused medicine with a substance like cat litter or coffee grounds. Then throw the medicine away in a sealed container like a sealed bag or a coffee can with a lid. Do not use the medicine after the expiration date. Store at room temperature between 20 and 25 degrees C (68 and  77 degrees F). Keep container tightly closed. NOTE: This sheet is a summary. It may not cover all possible information. If you have questions about this medicine, talk to your doctor, pharmacist, or health care provider.  2019 Elsevier/Gold Standard (2016-09-09 08:23:13)  

## 2018-08-30 ENCOUNTER — Other Ambulatory Visit: Payer: Self-pay | Admitting: Adult Health

## 2018-08-30 DIAGNOSIS — E559 Vitamin D deficiency, unspecified: Secondary | ICD-10-CM

## 2018-09-11 ENCOUNTER — Ambulatory Visit: Payer: 59 | Admitting: Adult Health

## 2018-09-13 ENCOUNTER — Ambulatory Visit: Payer: 59 | Admitting: Adult Health

## 2018-09-18 ENCOUNTER — Ambulatory Visit: Payer: 59 | Admitting: Adult Health

## 2018-09-18 ENCOUNTER — Encounter: Payer: Self-pay | Admitting: Adult Health

## 2018-09-18 ENCOUNTER — Other Ambulatory Visit: Payer: Self-pay

## 2018-09-18 VITALS — BP 120/64 | HR 68 | Ht 69.0 in | Wt 185.6 lb

## 2018-09-18 DIAGNOSIS — E663 Overweight: Secondary | ICD-10-CM | POA: Diagnosis not present

## 2018-09-18 DIAGNOSIS — E782 Mixed hyperlipidemia: Secondary | ICD-10-CM | POA: Diagnosis not present

## 2018-09-18 MED ORDER — PHENTERMINE HCL 37.5 MG PO CAPS: 37.5000 mg | ORAL_CAPSULE | ORAL | 0 refills | Status: DC

## 2018-09-18 NOTE — Progress Notes (Signed)
[facility] [place], [place] [address]  Internal MEDICINE  Telephone Visit  Patient Name: Ruth Morales  [id number]  [id number]  Date of Service: 09/18/2018  I connected with the patient at  [phone] by telephone and verified the patients identity using two identifiers.   I discussed the limitations, risks, security and privacy concerns of performing an evaluation and management service by telephone and the availability of in person appointments. I also discussed with the patient that there may be a patient responsible charge related to the service.  The patient expressed understanding and agrees to proceed.    Chief Complaint  Patient presents with  . Telephone Assessment  . Telephone Screen  . Medical Management of Chronic Issues    Weight Management     HPI PT seen via video for weight loss.  Patient is using phentermine for weight loss.  Since our last visit they have lost -1 pounds.  The patient denies any chest pain, palpitations, shortness of breath, constipation, headaches or any other side effects of the medication.  Patient wishes to continue to use this medication for weight loss at this time. This was her first month back on phentermine and she dropped the last two weeks of pills in her toilet.     Current Medication: Outpatient Encounter Medications as of 09/18/2018  Medication Sig  . Calcium Citrate-Vitamin D (CALCIUM CITRATE + D3 MAXIMUM) 315-250 MG-UNIT TABS Take by mouth.  . cyanocobalamin (,VITAMIN B-12,) 1000 MCG/ML injection Inject 1 mL (1,000 mcg total) into the muscle every 30 (thirty) days.  Marland Kitchen ibuprofen (ADVIL,MOTRIN) 200 MG tablet Take 600 mg by mouth every 6 (six) hours as needed for headache or moderate pain.  Marland Kitchen loperamide (IMODIUM) 2 MG capsule Take 4 mg by mouth 2 (two) times daily.   . phentermine 37.5 MG capsule Take 1 capsule (37.5 mg total) by mouth every morning.  . Vitamin D, Ergocalciferol, (DRISDOL) 1.25 MG (50000 UT) CAPS capsule  TAKE 1 CAPSULE (50,000 UNITS TOTAL) BY MOUTH EVERY 7 (SEVEN) DAYS.  . [DISCONTINUED] phentermine 37.5 MG capsule Take 1 capsule (37.5 mg total) by mouth every morning.  . Insulin Pen Needle (BD PEN NEEDLE NANO U/F) 32G X 4 MM MISC Use WITH SAXENDA (Patient not taking: Reported on 09/18/2018)   No facility-administered encounter medications on file as of 09/18/2018.     Surgical History: Past Surgical History:  Procedure Laterality Date  . BREAST BIOPSY Left 2003   needle bx-neg  . BREAST SURGERY Bilateral 2004   breast reduction  . CATARACT EXTRACTION W/PHACO Left 02/06/2018   Procedure: CATARACT EXTRACTION PHACO AND INTRAOCULAR LENS PLACEMENT (IOC);  Surgeon: Birder Robson, MD;  Location: [facility] ORS;  Service: Ophthalmology;  Laterality: Left;  [place] 00:31.5 CDE 3.49 Fluid pack Lot # [id number]  . COLON SURGERY  2004   colon removed  . CTR    . EXTRACORPOREAL SHOCK WAVE LITHOTRIPSY Right 02/26/2015   Procedure: EXTRACORPOREAL SHOCK WAVE LITHOTRIPSY (ESWL);  Surgeon: Collier Flowers, MD;  Location: [facility] ORS;  Service: Urology;  Laterality: Right;  . REDUCTION MAMMAPLASTY Bilateral 2005    Medical History: Past Medical History:  Diagnosis Date  . Anxiety 11/21/2013  . Breath shortness 11/21/2013  . Crohn disease ([place])   . Dizziness 11/21/2013  . Extensor tenosynovitis of wrist 12/24/2013  . Ganglion cyst of wrist 12/24/2013  . Hypotension, postural 11/22/2013  . Psoriasis   . Skin cancer     Family History: Family History  Problem  Relation Age of Onset  . Diabetes Father   . Diabetes Mother   . Heart disease Maternal Grandmother   . Breast cancer Neg Hx     Social History   Socioeconomic History  . Marital status: Married    Spouse name: Not on file  . Number of children: Not on file  . Years of education: Not on file  . Highest education level: Not on file  Occupational History  . Not on file  Social Needs  . Financial resource strain: Not on file  . Food insecurity:     Worry: Not on file    Inability: Not on file  . Transportation needs:    Medical: Not on file    Non-medical: Not on file  Tobacco Use  . Smoking status: Former Research scientist (life sciences)  . Smokeless tobacco: Never Used  Substance and Sexual Activity  . Alcohol use: Yes    Comment: occasional drink, once a month  . Drug use: No  . Sexual activity: Yes    Partners: Male  Lifestyle  . Physical activity:    Days per week: Not on file    Minutes per session: Not on file  . Stress: Not on file  Relationships  . Social connections:    Talks on phone: Not on file    Gets together: Not on file    Attends religious service: Not on file    Active member of club or organization: Not on file    Attends meetings of clubs or organizations: Not on file    Relationship status: Not on file  . Intimate partner violence:    Fear of current or ex partner: Not on file    Emotionally abused: Not on file    Physically abused: Not on file    Forced sexual activity: Not on file  Other Topics Concern  . Not on file  Social History Narrative  . Not on file      Review of Systems  Constitutional: Negative for chills, fatigue and unexpected weight change.  HENT: Negative for congestion, rhinorrhea, sneezing and sore throat.   Eyes: Negative for photophobia, pain and redness.  Respiratory: Negative for cough, chest tightness and shortness of breath.   Cardiovascular: Negative for chest pain and palpitations.  Gastrointestinal: Negative for abdominal pain, constipation, diarrhea, nausea and vomiting.  Endocrine: Negative.   Genitourinary: Negative for dysuria and frequency.  Musculoskeletal: Negative for arthralgias, back pain, joint swelling and neck pain.  Skin: Negative for rash.  Allergic/Immunologic: Negative.   Neurological: Negative for tremors and numbness.  Hematological: Negative for adenopathy. Does not bruise/bleed easily.  Psychiatric/Behavioral: Negative for behavioral problems and sleep  disturbance. The patient is not nervous/anxious.     Vital Signs: BP 120/64   Pulse 68   Ht 5\' 9"  (1.753 m)   Wt 185 lb 9.6 oz (84.2 kg)   BMI 27.41 kg/m    Observation/Objective:  Appears well. NAD noted.  Speaking in full sentences.    Assessment/Plan: 1. Mixed hyperlipidemia Will get lipid panel at next visit when patient is in office.   2. Overweight Obesity Counseling: Risk Assessment: An assessment of behavioral risk factors was made today and includes lack of exercise sedentary lifestyle, lack of portion control and poor dietary habits.  Risk Modification Advice: She was counseled on portion control guidelines. Restricting daily caloric intake to. . The detrimental long term effects of obesity on her health and ongoing poor compliance was also discussed with the patient.  There is a liability release in patients' chart. There has been a 10 minute discussion about the side effects including but not limited to elevated blood pressure, anxiety, lack of sleep and dry mouth. Pt understands and will like to start/continue on appetite suppressant at this time. There will be one month RX given at the time of visit with proper follow up. Nova diet plan with restricted calories is given to the pt. Pt understands and agrees with  plan of treatment - phentermine 37.5 MG capsule; Take 1 capsule (37.5 mg total) by mouth every morning.  Dispense: 30 capsule; Refill: 0  General Counseling: Hazel verbalizes understanding of the findings of today's phone visit and agrees with plan of treatment. I have discussed any further diagnostic evaluation that may be needed or ordered today. We also reviewed her medications today. she has been encouraged to call the office with any questions or concerns that should arise related to todays visit.    No orders of the defined types were placed in this encounter.   Meds ordered this encounter  Medications  . phentermine 37.5 MG capsule    Sig: Take 1  capsule (37.5 mg total) by mouth every morning.    Dispense:  30 capsule    Refill:  0    Time spent: [place] AGNP-C Internal medicine

## 2018-10-02 ENCOUNTER — Encounter: Payer: Self-pay | Admitting: Adult Health

## 2018-10-02 ENCOUNTER — Ambulatory Visit (INDEPENDENT_AMBULATORY_CARE_PROVIDER_SITE_OTHER): Payer: 59 | Admitting: Adult Health

## 2018-10-02 ENCOUNTER — Other Ambulatory Visit: Payer: Self-pay

## 2018-10-02 VITALS — BP 114/67 | Ht 69.0 in | Wt 183.0 lb

## 2018-10-02 DIAGNOSIS — L089 Local infection of the skin and subcutaneous tissue, unspecified: Secondary | ICD-10-CM | POA: Diagnosis not present

## 2018-10-02 DIAGNOSIS — B9689 Other specified bacterial agents as the cause of diseases classified elsewhere: Secondary | ICD-10-CM | POA: Diagnosis not present

## 2018-10-02 DIAGNOSIS — R030 Elevated blood-pressure reading, without diagnosis of hypertension: Secondary | ICD-10-CM

## 2018-10-02 MED ORDER — CEPHALEXIN 500 MG PO CAPS: 500.0000 mg | ORAL_CAPSULE | Freq: Two times a day (BID) | ORAL | 0 refills | Status: DC

## 2018-10-02 NOTE — Patient Instructions (Signed)

## 2018-10-02 NOTE — Progress Notes (Signed)
[facility] [place], [place] [address]  Internal MEDICINE  Telephone Visit  Patient Name: Ruth Morales  [id number]  [id number]  Date of Service: 10/02/2018  I connected with the patient at 935 by telephone and verified the patients identity using two identifiers.   I discussed the limitations, risks, security and privacy concerns of performing an evaluation and management service by telephone and the availability of in person appointments. I also discussed with the patient that there may be a patient responsible charge related to the service.  The patient expressed understanding and agrees to proceed.    Chief Complaint  Patient presents with  . Telephone Screen  . Telephone Assessment  . Medical Management of Chronic Issues    left leg infectecd and warm to touch and red     HPI  PT reports about three weeks ago she hit her leg on the corner of her dishwasher.  She reports it punctured the skin.  She reports it scabbed over like normal.  Now she sees that the site if red, and warm to touch.  She states there is some aching after she has been at work all day.    Current Medication: Outpatient Encounter Medications as of 10/02/2018  Medication Sig  . Calcium Citrate-Vitamin D (CALCIUM CITRATE + D3 MAXIMUM) 315-250 MG-UNIT TABS Take by mouth.  . cyanocobalamin (,VITAMIN B-12,) 1000 MCG/ML injection Inject 1 mL (1,000 mcg total) into the muscle every 30 (thirty) days.  Marland Kitchen ibuprofen (ADVIL,MOTRIN) 200 MG tablet Take 600 mg by mouth every 6 (six) hours as needed for headache or moderate pain.  . Insulin Pen Needle (BD PEN NEEDLE NANO U/F) 32G X 4 MM MISC Use WITH SAXENDA  . loperamide (IMODIUM) 2 MG capsule Take 4 mg by mouth 2 (two) times daily.   . phentermine 37.5 MG capsule Take 1 capsule (37.5 mg total) by mouth every morning.  . Vitamin D, Ergocalciferol, (DRISDOL) 1.25 MG (50000 UT) CAPS capsule TAKE 1 CAPSULE (50,000 UNITS TOTAL) BY MOUTH EVERY 7 (SEVEN)  DAYS.  . cephALEXin (KEFLEX) 500 MG capsule Take 1 capsule (500 mg total) by mouth 2 (two) times daily.   No facility-administered encounter medications on file as of 10/02/2018.     Surgical History: Past Surgical History:  Procedure Laterality Date  . BREAST BIOPSY Left 2003   needle bx-neg  . BREAST SURGERY Bilateral 2004   breast reduction  . CATARACT EXTRACTION W/PHACO Left 02/06/2018   Procedure: CATARACT EXTRACTION PHACO AND INTRAOCULAR LENS PLACEMENT (IOC);  Surgeon: Birder Robson, MD;  Location: [facility] ORS;  Service: Ophthalmology;  Laterality: Left;  [place] 00:31.5 CDE 3.49 Fluid pack Lot # [id number]  . COLON SURGERY  2004   colon removed  . CTR    . EXTRACORPOREAL SHOCK WAVE LITHOTRIPSY Right 02/26/2015   Procedure: EXTRACORPOREAL SHOCK WAVE LITHOTRIPSY (ESWL);  Surgeon: Collier Flowers, MD;  Location: [facility] ORS;  Service: Urology;  Laterality: Right;  . REDUCTION MAMMAPLASTY Bilateral 2005    Medical History: Past Medical History:  Diagnosis Date  . Anxiety 11/21/2013  . Breath shortness 11/21/2013  . Crohn disease ([place])   . Dizziness 11/21/2013  . Extensor tenosynovitis of wrist 12/24/2013  . Ganglion cyst of wrist 12/24/2013  . Hypotension, postural 11/22/2013  . Psoriasis   . Skin cancer     Family History: Family History  Problem Relation Age of Onset  . Diabetes Father   . Diabetes Mother   . Heart disease Maternal Grandmother   .  Breast cancer Neg Hx     Social History   Socioeconomic History  . Marital status: Married    Spouse name: Not on file  . Number of children: Not on file  . Years of education: Not on file  . Highest education level: Not on file  Occupational History  . Not on file  Social Needs  . Financial resource strain: Not on file  . Food insecurity    Worry: Not on file    Inability: Not on file  . Transportation needs    Medical: Not on file    Non-medical: Not on file  Tobacco Use  . Smoking status: Former Research scientist (life sciences)  . Smokeless  tobacco: Never Used  Substance and Sexual Activity  . Alcohol use: Yes    Comment: occasional drink, once a month  . Drug use: No  . Sexual activity: Yes    Partners: Male  Lifestyle  . Physical activity    Days per week: Not on file    Minutes per session: Not on file  . Stress: Not on file  Relationships  . Social Herbalist on phone: Not on file    Gets together: Not on file    Attends religious service: Not on file    Active member of club or organization: Not on file    Attends meetings of clubs or organizations: Not on file    Relationship status: Not on file  . Intimate partner violence    Fear of current or ex partner: Not on file    Emotionally abused: Not on file    Physically abused: Not on file    Forced sexual activity: Not on file  Other Topics Concern  . Not on file  Social History Narrative  . Not on file     Review of Systems  Constitutional: Negative for chills, fatigue and unexpected weight change.  HENT: Negative for congestion, rhinorrhea, sneezing and sore throat.   Eyes: Negative for photophobia, pain and redness.  Respiratory: Negative for cough, chest tightness and shortness of breath.   Cardiovascular: Negative for chest pain and palpitations.  Gastrointestinal: Negative for abdominal pain, constipation, diarrhea, nausea and vomiting.  Endocrine: Negative.   Genitourinary: Negative for dysuria and frequency.  Musculoskeletal: Negative for arthralgias, back pain, joint swelling and neck pain.  Skin: Positive for wound. Negative for rash.       Redness and warm to touch  Allergic/Immunologic: Negative.   Neurological: Negative for tremors and numbness.  Hematological: Negative for adenopathy. Does not bruise/bleed easily.  Psychiatric/Behavioral: Negative for behavioral problems and sleep disturbance. The patient is not nervous/anxious.     Vital Signs: BP 114/67   Ht 5\' 9"  (1.753 m)   Wt 183 lb (83 kg)   BMI 27.02 kg/m     Observation/Objective:  Well appearing. Nad noted.    Assessment/Plan: 1. Bacterial skin infection Advised patient to take entire course of antibiotics as prescribed with food. Pt should return to clinic in 7-10 days if symptoms fail to improve or new symptoms develop.  - cephALEXin (KEFLEX) 500 MG capsule; Take 1 capsule (500 mg total) by mouth 2 (two) times daily.  Dispense: 14 capsule; Refill: 0  2. Elevated blood pressure reading Stable, continue present management.  General Counseling: Kendria verbalizes understanding of the findings of today's phone visit and agrees with plan of treatment. I have discussed any further diagnostic evaluation that may be needed or ordered today. We also reviewed her medications  today. she has been encouraged to call the office with any questions or concerns that should arise related to todays visit.    No orders of the defined types were placed in this encounter.   Meds ordered this encounter  Medications  . cephALEXin (KEFLEX) 500 MG capsule    Sig: Take 1 capsule (500 mg total) by mouth 2 (two) times daily.    Dispense:  14 capsule    Refill:  0    Time spent: [place] AGNP-C Internal medicine

## 2018-10-15 ENCOUNTER — Ambulatory Visit: Payer: 59 | Admitting: Adult Health

## 2018-10-31 ENCOUNTER — Ambulatory Visit: Payer: Self-pay | Admitting: Adult Health

## 2018-11-05 ENCOUNTER — Ambulatory Visit (INDEPENDENT_AMBULATORY_CARE_PROVIDER_SITE_OTHER): Payer: 59 | Admitting: Adult Health

## 2018-11-05 ENCOUNTER — Encounter: Payer: Self-pay | Admitting: Adult Health

## 2018-11-05 ENCOUNTER — Other Ambulatory Visit: Payer: Self-pay

## 2018-11-05 VITALS — BP 142/80 | HR 74 | Resp 16 | Ht 68.5 in | Wt 190.0 lb

## 2018-11-05 DIAGNOSIS — F411 Generalized anxiety disorder: Secondary | ICD-10-CM | POA: Diagnosis not present

## 2018-11-05 DIAGNOSIS — R7309 Other abnormal glucose: Secondary | ICD-10-CM | POA: Diagnosis not present

## 2018-11-05 DIAGNOSIS — E663 Overweight: Secondary | ICD-10-CM | POA: Diagnosis not present

## 2018-11-05 LAB — POCT GLYCOSYLATED HEMOGLOBIN (HGB A1C): Hemoglobin A1C: 5.6 % (ref 4.0–5.6)

## 2018-11-05 MED ORDER — PHENDIMETRAZINE TARTRATE ER 105 MG PO CP24: 105.0000 mg | ORAL_CAPSULE | Freq: Every day | ORAL | 0 refills | Status: DC

## 2018-11-05 NOTE — Progress Notes (Signed)
[facility] [place], [place] [address]  Internal MEDICINE  Office Visit Note  Patient Name: Ruth Morales  [id number]  [id number]  Date of Service: 11/05/2018  Chief Complaint  Patient presents with  . Medical Management of Chronic Issues    4 week follow up weight loss management , not taken the phentermine was causing headaches    HPI  Pt is here for follow up on weight loss.  Pt reports she had a back injury, and ended up in the urgent care.  She was on steroids for a period and she was overeating.  When she started back on the phentermine, she began having daily headaches.  She stopped using the phentermine.  She would like to try Bontril again, as she did not have headaches with it.    Current Medication: Outpatient Encounter Medications as of 11/05/2018  Medication Sig  . Calcium Citrate-Vitamin D (CALCIUM CITRATE + D3 MAXIMUM) 315-250 MG-UNIT TABS Take by mouth.  . cyanocobalamin (,VITAMIN B-12,) 1000 MCG/ML injection Inject 1 mL (1,000 mcg total) into the muscle every 30 (thirty) days.  Marland Kitchen ibuprofen (ADVIL,MOTRIN) 200 MG tablet Take 600 mg by mouth every 6 (six) hours as needed for headache or moderate pain.  . Insulin Pen Needle (BD PEN NEEDLE NANO U/F) 32G X 4 MM MISC Use WITH SAXENDA  . loperamide (IMODIUM) 2 MG capsule Take 4 mg by mouth 2 (two) times daily.   . Vitamin D, Ergocalciferol, (DRISDOL) 1.25 MG (50000 UT) CAPS capsule TAKE 1 CAPSULE (50,000 UNITS TOTAL) BY MOUTH EVERY 7 (SEVEN) DAYS.  Marland Kitchen Phendimetrazine Tartrate 105 MG CP24 Take 1 capsule (105 mg total) by mouth daily.  . phentermine 37.5 MG capsule Take 1 capsule (37.5 mg total) by mouth every morning. (Patient not taking: Reported on 11/05/2018)  . [DISCONTINUED] cephALEXin (KEFLEX) 500 MG capsule Take 1 capsule (500 mg total) by mouth 2 (two) times daily. (Patient not taking: Reported on 11/05/2018)   No facility-administered encounter medications on file as of 11/05/2018.     Surgical  History: Past Surgical History:  Procedure Laterality Date  . BREAST BIOPSY Left 2003   needle bx-neg  . BREAST SURGERY Bilateral 2004   breast reduction  . CATARACT EXTRACTION W/PHACO Left 02/06/2018   Procedure: CATARACT EXTRACTION PHACO AND INTRAOCULAR LENS PLACEMENT (IOC);  Surgeon: Birder Robson, MD;  Location: [facility] ORS;  Service: Ophthalmology;  Laterality: Left;  [place] 00:31.5 CDE 3.49 Fluid pack Lot # [id number]  . COLON SURGERY  2004   colon removed  . CTR    . EXTRACORPOREAL SHOCK WAVE LITHOTRIPSY Right 02/26/2015   Procedure: EXTRACORPOREAL SHOCK WAVE LITHOTRIPSY (ESWL);  Surgeon: Collier Flowers, MD;  Location: [facility] ORS;  Service: Urology;  Laterality: Right;  . REDUCTION MAMMAPLASTY Bilateral 2005    Medical History: Past Medical History:  Diagnosis Date  . Anxiety 11/21/2013  . Breath shortness 11/21/2013  . Crohn disease ([place])   . Dizziness 11/21/2013  . Extensor tenosynovitis of wrist 12/24/2013  . Ganglion cyst of wrist 12/24/2013  . Hypotension, postural 11/22/2013  . Psoriasis   . Skin cancer     Family History: Family History  Problem Relation Age of Onset  . Diabetes Father   . Diabetes Mother   . Heart disease Maternal Grandmother   . Breast cancer Neg Hx     Social History   Socioeconomic History  . Marital status: Married    Spouse name: Not on file  . Number of children:  Not on file  . Years of education: Not on file  . Highest education level: Not on file  Occupational History  . Not on file  Social Needs  . Financial resource strain: Not on file  . Food insecurity    Worry: Not on file    Inability: Not on file  . Transportation needs    Medical: Not on file    Non-medical: Not on file  Tobacco Use  . Smoking status: Former Research scientist (life sciences)  . Smokeless tobacco: Never Used  Substance and Sexual Activity  . Alcohol use: Yes    Comment: occasional drink, once a month  . Drug use: No  . Sexual activity: Yes    Partners: Male  Lifestyle  .  Physical activity    Days per week: Not on file    Minutes per session: Not on file  . Stress: Not on file  Relationships  . Social Herbalist on phone: Not on file    Gets together: Not on file    Attends religious service: Not on file    Active member of club or organization: Not on file    Attends meetings of clubs or organizations: Not on file    Relationship status: Not on file  . Intimate partner violence    Fear of current or ex partner: Not on file    Emotionally abused: Not on file    Physically abused: Not on file    Forced sexual activity: Not on file  Other Topics Concern  . Not on file  Social History Narrative  . Not on file      Review of Systems  Constitutional: Negative for chills, fatigue and unexpected weight change.  HENT: Negative for congestion, rhinorrhea, sneezing and sore throat.   Eyes: Negative for photophobia, pain and redness.  Respiratory: Negative for cough, chest tightness and shortness of breath.   Cardiovascular: Negative for chest pain and palpitations.  Gastrointestinal: Negative for abdominal pain, constipation, diarrhea, nausea and vomiting.  Endocrine: Negative.   Genitourinary: Negative for dysuria and frequency.  Musculoskeletal: Negative for arthralgias, back pain, joint swelling and neck pain.  Skin: Negative for rash.  Allergic/Immunologic: Negative.   Neurological: Negative for tremors and numbness.  Hematological: Negative for adenopathy. Does not bruise/bleed easily.  Psychiatric/Behavioral: Negative for behavioral problems and sleep disturbance. The patient is not nervous/anxious.     Vital Signs: BP (!) 142/80   Pulse 74   Resp 16   Ht 5' 8.5" (1.74 m)   Wt 190 lb (86.2 kg)   SpO2 99%   BMI 28.47 kg/m    Physical Exam Vitals signs and nursing note reviewed.  Constitutional:      General: She is not in acute distress.    Appearance: She is well-developed. She is not diaphoretic.  HENT:     Head:  Normocephalic and atraumatic.     Mouth/Throat:     Pharynx: No oropharyngeal exudate.  Eyes:     Pupils: Pupils are equal, round, and reactive to light.  Neck:     Musculoskeletal: Normal range of motion and neck supple.     Thyroid: No thyromegaly.     Vascular: No JVD.     Trachea: No tracheal deviation.  Cardiovascular:     Rate and Rhythm: Normal rate and regular rhythm.     Heart sounds: Normal heart sounds. No murmur. No friction rub. No gallop.   Pulmonary:     Effort: Pulmonary effort is  normal. No respiratory distress.     Breath sounds: Normal breath sounds. No wheezing or rales.  Chest:     Chest wall: No tenderness.  Abdominal:     Palpations: Abdomen is soft.     Tenderness: There is no abdominal tenderness. There is no guarding.  Musculoskeletal: Normal range of motion.  Lymphadenopathy:     Cervical: No cervical adenopathy.  Skin:    General: Skin is warm and dry.  Neurological:     Mental Status: She is alert and oriented to person, place, and time.     Cranial Nerves: No cranial nerve deficit.  Psychiatric:        Behavior: Behavior normal.        Thought Content: Thought content normal.        Judgment: Judgment normal.    Assessment/Plan: 1. Overweight Refilled Controlled medications today. Reviewed risks and possible side effects associated with taking Stimulants. Combination of these drugs with other psychotropic medications could cause dizziness and drowsiness. Pt needs to Monitor symptoms and exercise caution in driving and operating heavy machinery to avoid damages to oneself, to others and to the surroundings. Patient verbalized understanding in this matter. Dependence and abuse for these drugs will be monitored closely. A Controlled substance policy and procedure is on file which allows [facility] to order a urine drug screen test at any visit. Patient understands and agrees with the plan.. - Phendimetrazine Tartrate 105 MG CP24; Take 1  capsule (105 mg total) by mouth daily.  Dispense: 30 capsule; Refill: 0  2. Generalized anxiety disorder Stable, continue present management.   3. Elevated glucose level A1C WNL.  - POCT HgB A1C  General Counseling: Ruth Morales verbalizes understanding of the findings of todays visit and agrees with plan of treatment. I have discussed any further diagnostic evaluation that may be needed or ordered today. We also reviewed her medications today. she has been encouraged to call the office with any questions or concerns that should arise related to todays visit.    Orders Placed This Encounter  Procedures  . POCT HgB A1C    Meds ordered this encounter  Medications  . Phendimetrazine Tartrate 105 MG CP24    Sig: Take 1 capsule (105 mg total) by mouth daily.    Dispense:  30 capsule    Refill:  0    Time spent: 15 Minutes   This patient was seen by Orson Gear AGNP-C in Collaboration with Dr Lavera Guise as a part of collaborative care agreement     Kendell Bane AGNP-C Internal medicine

## 2018-12-03 ENCOUNTER — Ambulatory Visit: Payer: 59 | Admitting: Adult Health

## 2019-03-18 ENCOUNTER — Other Ambulatory Visit: Payer: Self-pay | Admitting: Internal Medicine

## 2019-03-18 DIAGNOSIS — Z1231 Encounter for screening mammogram for malignant neoplasm of breast: Secondary | ICD-10-CM

## 2019-03-20 ENCOUNTER — Telehealth: Payer: Self-pay

## 2019-03-20 NOTE — Telephone Encounter (Signed)
CONFIRMED AND SCREENED PATIENT FOR 03-21-19 OV.

## 2019-03-21 ENCOUNTER — Ambulatory Visit: Payer: 59 | Admitting: Nurse Practitioner

## 2019-03-21 ENCOUNTER — Other Ambulatory Visit: Payer: Self-pay

## 2019-03-21 ENCOUNTER — Encounter: Payer: Self-pay | Admitting: Nurse Practitioner

## 2019-03-21 VITALS — BP 131/83 | HR 67 | Resp 16 | Ht 68.0 in | Wt 193.8 lb

## 2019-03-21 DIAGNOSIS — R5383 Other fatigue: Secondary | ICD-10-CM | POA: Diagnosis not present

## 2019-03-21 DIAGNOSIS — Z6829 Body mass index (BMI) 29.0-29.9, adult: Secondary | ICD-10-CM

## 2019-03-21 MED ORDER — PHENTERMINE HCL 37.5 MG PO TABS: 37.5000 mg | ORAL_TABLET | Freq: Every day | ORAL | 1 refills | Status: DC

## 2019-03-21 NOTE — Progress Notes (Signed)
[facility] [place], [place] [address]  Internal MEDICINE  Office Visit Note  Patient Name: Ruth Morales  [id number]  [id number]  Date of Service: 03/24/2019  Chief Complaint  Patient presents with  . Follow-up    weight management, does patient take phentermine or phentimetrazine  . Vitamin D Deficiency    pt has not had labs this year and does not take vit d, last years value was 39     The patient is here for routine follow up visit. She is concerned about a three pound weight gain since her last visit. Has not been able to follow up regularly for weight management. She lost her job 12/21/2018, and was dealing with effects of that. States that she has been cooking a lot more and is preparing foods which are not exactly low-calorie. She also states that she has not been participating in routine exercise. She feels as though her clothes are fitting tight and she is uncomfortable in everything except for sweat pants. She has been on both phentermine and bontril in the past. Tolerated both well and lost weight without negative side effects.        Current Medication: Outpatient Encounter Medications as of 03/21/2019  Medication Sig Note  . Calcium Citrate-Vitamin D (CALCIUM CITRATE + D3 MAXIMUM) 315-250 MG-UNIT TABS Take by mouth.   . cyanocobalamin (,VITAMIN B-12,) 1000 MCG/ML injection Inject 1 mL (1,000 mcg total) into the muscle every 30 (thirty) days.   Marland Kitchen ibuprofen (ADVIL,MOTRIN) 200 MG tablet Take 600 mg by mouth every 6 (six) hours as needed for headache or moderate pain.   . Insulin Pen Needle (BD PEN NEEDLE NANO U/F) 32G X 4 MM MISC Use WITH SAXENDA   . loperamide (IMODIUM) 2 MG capsule Take 4 mg by mouth 2 (two) times daily.    . [DISCONTINUED] Phendimetrazine Tartrate 105 MG CP24 Take 1 capsule (105 mg total) by mouth daily. 03/21/2019: caused headache   . [DISCONTINUED] phentermine 37.5 MG capsule Take 1 capsule (37.5 mg total) by mouth every  morning.   . phentermine (ADIPEX-P) 37.5 MG tablet Take 1 tablet (37.5 mg total) by mouth daily before breakfast.   . Vitamin D, Ergocalciferol, (DRISDOL) 1.25 MG (50000 UT) CAPS capsule TAKE 1 CAPSULE (50,000 UNITS TOTAL) BY MOUTH EVERY 7 (SEVEN) DAYS. (Patient not taking: Reported on 03/21/2019)    No facility-administered encounter medications on file as of 03/21/2019.    Surgical History: Past Surgical History:  Procedure Laterality Date  . BREAST BIOPSY Left 2003   needle bx-neg  . BREAST SURGERY Bilateral 2004   breast reduction  . CATARACT EXTRACTION W/PHACO Left 02/06/2018   Procedure: CATARACT EXTRACTION PHACO AND INTRAOCULAR LENS PLACEMENT (IOC);  Surgeon: Birder Robson, MD;  Location: [facility] ORS;  Service: Ophthalmology;  Laterality: Left;  [place] 00:31.5 CDE 3.49 Fluid pack Lot # [id number]  . COLON SURGERY  2004   colon removed  . CTR    . EXTRACORPOREAL SHOCK WAVE LITHOTRIPSY Right 02/26/2015   Procedure: EXTRACORPOREAL SHOCK WAVE LITHOTRIPSY (ESWL);  Surgeon: Collier Flowers, MD;  Location: [facility] ORS;  Service: Urology;  Laterality: Right;  . REDUCTION MAMMAPLASTY Bilateral 2005    Medical History: Past Medical History:  Diagnosis Date  . Anxiety 11/21/2013  . Breath shortness 11/21/2013  . Crohn disease ([place])   . Dizziness 11/21/2013  . Extensor tenosynovitis of wrist 12/24/2013  . Ganglion cyst of wrist 12/24/2013  . Hypotension, postural 11/22/2013  . Psoriasis   .  Skin cancer     Family History: Family History  Problem Relation Age of Onset  . Diabetes Father   . Diabetes Mother   . Heart disease Maternal Grandmother   . Breast cancer Neg Hx     Social History   Socioeconomic History  . Marital status: Married    Spouse name: Not on file  . Number of children: Not on file  . Years of education: Not on file  . Highest education level: Not on file  Occupational History  . Not on file  Tobacco Use  . Smoking status: Former Research scientist (life sciences)  . Smokeless tobacco:  Never Used  Substance and Sexual Activity  . Alcohol use: Yes    Comment: occasional drink, once a month  . Drug use: No  . Sexual activity: Yes    Partners: Male  Other Topics Concern  . Not on file  Social History Narrative  . Not on file   Social Determinants of Health   Financial Resource Strain:   . Difficulty of Paying Living Expenses: Not on file  Food Insecurity:   . Worried About Charity fundraiser in the Last Year: Not on file  . Ran Out of Food in the Last Year: Not on file  Transportation Needs:   . Lack of Transportation (Medical): Not on file  . Lack of Transportation (Non-Medical): Not on file  Physical Activity:   . Days of Exercise per Week: Not on file  . Minutes of Exercise per Session: Not on file  Stress:   . Feeling of Stress : Not on file  Social Connections:   . Frequency of Communication with Friends and Family: Not on file  . Frequency of Social Gatherings with Friends and Family: Not on file  . Attends Religious Services: Not on file  . Active Member of Clubs or Organizations: Not on file  . Attends Archivist Meetings: Not on file  . Marital Status: Not on file  Intimate Partner Violence:   . Fear of Current or Ex-Partner: Not on file  . Emotionally Abused: Not on file  . Physically Abused: Not on file  . Sexually Abused: Not on file      Review of Systems  Constitutional: Positive for unexpected weight change. Negative for chills and fatigue.       Unplanned weight gain. Having trouble taking it off.   HENT: Negative for congestion, postnasal drip, rhinorrhea, sneezing and sore throat.   Respiratory: Negative for cough, chest tightness and shortness of breath.   Cardiovascular: Negative for chest pain and palpitations.  Gastrointestinal: Negative for abdominal pain, constipation, diarrhea, nausea and vomiting.  Endocrine: Negative for cold intolerance, heat intolerance, polydipsia and polyuria.  Genitourinary: Negative for  dysuria and frequency.  Musculoskeletal: Negative for arthralgias, back pain, joint swelling and neck pain.  Skin: Negative for rash.  Allergic/Immunologic: Positive for environmental allergies.  Neurological: Negative for dizziness, tremors, numbness and headaches.  Hematological: Negative for adenopathy. Does not bruise/bleed easily.  Psychiatric/Behavioral: Positive for dysphoric mood. Negative for behavioral problems (Depression), sleep disturbance and suicidal ideas. The patient is nervous/anxious.     Today's Vitals   03/21/19 1134  BP: 131/83  Pulse: 67  Resp: 16  SpO2: 99%  Weight: 193 lb 12.8 oz (87.9 kg)  Height: 5\' 8"  (1.727 m)   Body mass index is 29.47 kg/m.  Physical Exam Vitals and nursing note reviewed.  Constitutional:      General: She is not in acute distress.  Appearance: Normal appearance. She is well-developed. She is not diaphoretic.  HENT:     Head: Normocephalic and atraumatic.     Nose: Nose normal.     Mouth/Throat:     Pharynx: No oropharyngeal exudate.  Eyes:     Extraocular Movements: Extraocular movements intact.     Pupils: Pupils are equal, round, and reactive to light.  Neck:     Thyroid: No thyromegaly.     Vascular: No JVD.     Trachea: No tracheal deviation.  Cardiovascular:     Rate and Rhythm: Normal rate and regular rhythm.     Heart sounds: Normal heart sounds. No murmur. No friction rub. No gallop.   Pulmonary:     Effort: Pulmonary effort is normal. No respiratory distress.     Breath sounds: Normal breath sounds. No wheezing or rales.  Chest:     Chest wall: No tenderness.  Abdominal:     General: Bowel sounds are normal.     Palpations: Abdomen is soft.  Musculoskeletal:        General: Normal range of motion.     Cervical back: Normal range of motion and neck supple.  Lymphadenopathy:     Cervical: No cervical adenopathy.  Skin:    General: Skin is warm and dry.  Neurological:     Mental Status: She is alert and  oriented to person, place, and time.     Cranial Nerves: No cranial nerve deficit.  Psychiatric:        Mood and Affect: Mood normal.        Behavior: Behavior normal.        Thought Content: Thought content normal.        Judgment: Judgment normal.    Assessment/Plan: 1. Other fatigue Will check routine, fasting labs along with anemia and thyroid panels. Discuss at next visit.   2. BMI 29.0-29.9,adult Restart phentermine tablets. Take daily. Limit claorie intake to 1200-1500 calories per day. Participate in routine exercise program.  - phentermine (ADIPEX-P) 37.5 MG tablet; Take 1 tablet (37.5 mg total) by mouth daily before breakfast.  Dispense: 30 tablet; Refill: 1  General Counseling: Emaree verbalizes understanding of the findings of todays visit and agrees with plan of treatment. I have discussed any further diagnostic evaluation that may be needed or ordered today. We also reviewed her medications today. she has been encouraged to call the office with any questions or concerns that should arise related to todays visit.    There is a liability release in patients' chart. There has been a 10 minute discussion about the side effects including but not limited to elevated blood pressure, anxiety, lack of sleep and dry mouth. Pt understands and will like to start/continue on appetite suppressant at this time. There will be one month RX given at the time of visit with proper follow up. Nova diet plan with restricted calories is given to the pt. Pt understands and agrees with  plan of treatment  This patient was seen by Leretha Pol FNP Collaboration with Dr Lavera Guise as a part of collaborative care agreement  Meds ordered this encounter  Medications  . phentermine (ADIPEX-P) 37.5 MG tablet    Sig: Take 1 tablet (37.5 mg total) by mouth daily before breakfast.    Dispense:  30 tablet    Refill:  1    Order Specific Question:   Supervising Provider    Answer:   Lavera Guise [place]     Time  spent: 25 Minutes      Dr Lavera Guise Internal medicine

## 2019-03-22 ENCOUNTER — Other Ambulatory Visit: Payer: Self-pay | Admitting: Nurse Practitioner

## 2019-03-23 LAB — COMPREHENSIVE METABOLIC PANEL
ALT: 23 IU/L (ref 0–32)
AST: 26 IU/L (ref 0–40)
Albumin/Globulin Ratio: 1.7 (ref 1.2–2.2)
Albumin: 4.5 g/dL (ref 3.8–4.9)
Alkaline Phosphatase: 77 IU/L (ref 39–117)
BUN/Creatinine Ratio: 18 (ref 12–28)
BUN: 17 mg/dL (ref 8–27)
Bilirubin Total: 0.5 mg/dL (ref 0.0–1.2)
CO2: 21 mmol/L (ref 20–29)
Calcium: 9.9 mg/dL (ref 8.7–10.3)
Chloride: 106 mmol/L (ref 96–106)
Creatinine, Ser: 0.92 mg/dL (ref 0.57–1.00)
GFR calc Af Amer: 78 mL/min/{1.73_m2} (ref >59)
GFR calc non Af Amer: 68 mL/min/{1.73_m2} (ref >59)
Globulin, Total: 2.7 g/dL (ref 1.5–4.5)
Glucose: 90 mg/dL (ref 65–99)
Potassium: 4.5 mmol/L (ref 3.5–5.2)
Sodium: 141 mmol/L (ref 134–144)
Total Protein: 7.2 g/dL (ref 6.0–8.5)

## 2019-03-23 LAB — IRON AND TIBC
Iron Saturation: 34 % (ref 15–55)
Iron: 106 ug/dL (ref 27–159)
Total Iron Binding Capacity: 311 ug/dL (ref 250–450)
UIBC: 205 ug/dL (ref 131–425)

## 2019-03-23 LAB — LIPID PANEL W/O CHOL/HDL RATIO
Cholesterol, Total: 232 mg/dL — ABNORMAL HIGH (ref 100–199)
HDL: 118 mg/dL (ref >39)
LDL Chol Calc (NIH): 98 mg/dL (ref 0–99)
Triglycerides: 99 mg/dL (ref 0–149)
VLDL Cholesterol Cal: 16 mg/dL (ref 5–40)

## 2019-03-23 LAB — B12 AND FOLATE PANEL
Folate: 8.2 ng/mL (ref >3.0)
Vitamin B-12: 422 pg/mL (ref 232–1245)

## 2019-03-23 LAB — CBC
Hematocrit: 40.6 % (ref 34.0–46.6)
Hemoglobin: 13.6 g/dL (ref 11.1–15.9)
MCH: 31.3 pg (ref 26.6–33.0)
MCHC: 33.5 g/dL (ref 31.5–35.7)
MCV: 93 fL (ref 79–97)
Platelets: 168 10*3/uL (ref 150–450)
RBC: 4.35 x10E6/uL (ref 3.77–5.28)
RDW: 12.9 % (ref 11.7–15.4)
WBC: 5.2 10*3/uL (ref 3.4–10.8)

## 2019-03-23 LAB — FERRITIN: Ferritin: 166 ng/mL — ABNORMAL HIGH (ref 15–150)

## 2019-03-23 LAB — T4, FREE: Free T4: 1.05 ng/dL (ref 0.82–1.77)

## 2019-03-23 LAB — TSH: TSH: 2.61 u[IU]/mL (ref 0.450–4.500)

## 2019-03-23 LAB — VITAMIN D 25 HYDROXY (VIT D DEFICIENCY, FRACTURES): Vit D, 25-Hydroxy: 22.6 ng/mL — ABNORMAL LOW (ref 30.0–100.0)

## 2019-03-24 DIAGNOSIS — R5383 Other fatigue: Secondary | ICD-10-CM | POA: Insufficient documentation

## 2019-03-26 NOTE — Progress Notes (Signed)
Mild elevation of total cholesterol. Will discuss with patient at visit 04/18/2019. Other labs good.

## 2019-03-27 ENCOUNTER — Ambulatory Visit
Admission: RE | Admit: 2019-03-27 | Discharge: 2019-03-27 | Disposition: A | Discharge status: Home / Self Care | Payer: 59 | Source: Ambulatory Visit | Attending: Internal Medicine | Admitting: Internal Medicine

## 2019-03-27 ENCOUNTER — Other Ambulatory Visit: Payer: Self-pay

## 2019-03-27 DIAGNOSIS — Z1231 Encounter for screening mammogram for malignant neoplasm of breast: Secondary | ICD-10-CM | POA: Diagnosis present

## 2019-04-16 ENCOUNTER — Telehealth: Payer: Self-pay

## 2019-04-16 NOTE — Telephone Encounter (Signed)
LMOM TO CONFIRM AND SCREEN FOR 04-18-19 OV. °

## 2019-04-18 ENCOUNTER — Telehealth: Payer: Self-pay

## 2019-04-18 ENCOUNTER — Other Ambulatory Visit: Payer: Self-pay

## 2019-04-18 ENCOUNTER — Ambulatory Visit (INDEPENDENT_AMBULATORY_CARE_PROVIDER_SITE_OTHER): Payer: 59 | Admitting: Nurse Practitioner

## 2019-04-18 ENCOUNTER — Encounter: Payer: Self-pay | Admitting: Nurse Practitioner

## 2019-04-18 VITALS — BP 136/80 | HR 75 | Resp 16 | Ht 68.0 in | Wt 189.4 lb

## 2019-04-18 DIAGNOSIS — E538 Deficiency of other specified B group vitamins: Secondary | ICD-10-CM

## 2019-04-18 DIAGNOSIS — Z6828 Body mass index (BMI) 28.0-28.9, adult: Secondary | ICD-10-CM

## 2019-04-18 DIAGNOSIS — N959 Unspecified menopausal and perimenopausal disorder: Secondary | ICD-10-CM

## 2019-04-18 DIAGNOSIS — E559 Vitamin D deficiency, unspecified: Secondary | ICD-10-CM | POA: Diagnosis not present

## 2019-04-18 MED ORDER — PHENTERMINE HCL 37.5 MG PO TABS: 37.5000 mg | ORAL_TABLET | Freq: Every day | ORAL | 1 refills | Status: DC

## 2019-04-18 MED ORDER — VITAMIN D (ERGOCALCIFEROL) 1.25 MG (50000 UNIT) PO CAPS: 50000.0000 [IU] | ORAL_CAPSULE | ORAL | 5 refills | Status: DC

## 2019-04-18 MED ORDER — CYANOCOBALAMIN 1000 MCG/ML IJ SOLN
1000.0000 ug | Freq: Once | INTRAMUSCULAR | Status: AC
  Administered 2019-04-18: 1000 ug via INTRAMUSCULAR

## 2019-04-18 NOTE — Telephone Encounter (Signed)
Confirmed appointment with patient. klh °

## 2019-04-18 NOTE — Progress Notes (Signed)
[facility] [place], [place] [address]  Internal MEDICINE  Office Visit Note  Patient Name: Ruth Morales  [id number]  [id number]  Date of Service: 04/21/2019  Chief Complaint  Patient presents with  . Follow-up    weight management, review labs  . Injections    b12 injection  . Medication Management    pt would like to know if she should restart progesterone     Ruth Morales presents today for a routine follow-up for weight management. She has lost 4 pounds since her last visit. She also inquired about restarting hormone replacement therapy. She states, "my sister thinks I have an attitude problem and should be on something." Ruth Morales also reports low libido. She used to be on estrogen patch and progesterone tablets and stopped taking these because she did not think they were helping anything, however, she would like to try this again.       Current Medication: Outpatient Encounter Medications as of 04/18/2019  Medication Sig  . Calcium Citrate-Vitamin D (CALCIUM CITRATE + D3 MAXIMUM) 315-250 MG-UNIT TABS Take by mouth.  . cyanocobalamin (,VITAMIN B-12,) 1000 MCG/ML injection Inject 1 mL (1,000 mcg total) into the muscle every 30 (thirty) days.  Marland Kitchen ibuprofen (ADVIL,MOTRIN) 200 MG tablet Take 600 mg by mouth every 6 (six) hours as needed for headache or moderate pain.  Marland Kitchen loperamide (IMODIUM) 2 MG capsule Take 4 mg by mouth 2 (two) times daily.   . phentermine (ADIPEX-P) 37.5 MG tablet Take 1 tablet (37.5 mg total) by mouth daily before breakfast.  . Vitamin D, Ergocalciferol, (DRISDOL) 1.25 MG (50000 UT) CAPS capsule Take 1 capsule (50,000 Units total) by mouth every 7 (seven) days.  . [DISCONTINUED] phentermine (ADIPEX-P) 37.5 MG tablet Take 1 tablet (37.5 mg total) by mouth daily before breakfast.  . [DISCONTINUED] Vitamin D, Ergocalciferol, (DRISDOL) 1.25 MG (50000 UT) CAPS capsule TAKE 1 CAPSULE (50,000 UNITS TOTAL) BY MOUTH EVERY 7 (SEVEN) DAYS.  Marland Kitchen  progesterone (PROMETRIUM) 100 MG capsule Take 1 capsule (100 mg total) by mouth daily.  . [DISCONTINUED] Insulin Pen Needle (BD PEN NEEDLE NANO U/F) 32G X 4 MM MISC Use WITH SAXENDA (Patient not taking: Reported on 04/18/2019)  . [EXPIRED] cyanocobalamin ((VITAMIN B-12)) injection 1,000 mcg    No facility-administered encounter medications on file as of 04/18/2019.    Surgical History: Past Surgical History:  Procedure Laterality Date  . BREAST BIOPSY Left 2003   needle bx-neg  . BREAST SURGERY Bilateral 2004   breast reduction  . CATARACT EXTRACTION W/PHACO Left 02/06/2018   Procedure: CATARACT EXTRACTION PHACO AND INTRAOCULAR LENS PLACEMENT (IOC);  Surgeon: Birder Robson, MD;  Location: [facility] ORS;  Service: Ophthalmology;  Laterality: Left;  [place] 00:31.5 CDE 3.49 Fluid pack Lot # [id number]  . COLON SURGERY  2004   colon removed  . CTR    . EXTRACORPOREAL SHOCK WAVE LITHOTRIPSY Right 02/26/2015   Procedure: EXTRACORPOREAL SHOCK WAVE LITHOTRIPSY (ESWL);  Surgeon: Collier Flowers, MD;  Location: [facility] ORS;  Service: Urology;  Laterality: Right;  . REDUCTION MAMMAPLASTY Bilateral 2005    Medical History: Past Medical History:  Diagnosis Date  . Anxiety 11/21/2013  . Breath shortness 11/21/2013  . Crohn disease ([place])   . Dizziness 11/21/2013  . Extensor tenosynovitis of wrist 12/24/2013  . Ganglion cyst of wrist 12/24/2013  . Hypotension, postural 11/22/2013  . Psoriasis   . Skin cancer     Family History: Family History  Problem Relation Age of Onset  .  Diabetes Father   . Diabetes Mother   . Heart disease Maternal Grandmother   . Breast cancer Neg Hx     Social History   Socioeconomic History  . Marital status: Married    Spouse name: Not on file  . Number of children: Not on file  . Years of education: Not on file  . Highest education level: Not on file  Occupational History  . Not on file  Tobacco Use  . Smoking status: Former Research scientist (life sciences)  . Smokeless tobacco: Never Used   Substance and Sexual Activity  . Alcohol use: Yes    Comment: occasional drink, once a month  . Drug use: No  . Sexual activity: Yes    Partners: Male  Other Topics Concern  . Not on file  Social History Narrative  . Not on file   Social Determinants of Health   Financial Resource Strain:   . Difficulty of Paying Living Expenses: Not on file  Food Insecurity:   . Worried About Charity fundraiser in the Last Year: Not on file  . Ran Out of Food in the Last Year: Not on file  Transportation Needs:   . Lack of Transportation (Medical): Not on file  . Lack of Transportation (Non-Medical): Not on file  Physical Activity:   . Days of Exercise per Week: Not on file  . Minutes of Exercise per Session: Not on file  Stress:   . Feeling of Stress : Not on file  Social Connections:   . Frequency of Communication with Friends and Family: Not on file  . Frequency of Social Gatherings with Friends and Family: Not on file  . Attends Religious Services: Not on file  . Active Member of Clubs or Organizations: Not on file  . Attends Archivist Meetings: Not on file  . Marital Status: Not on file  Intimate Partner Violence:   . Fear of Current or Ex-Partner: Not on file  . Emotionally Abused: Not on file  . Physically Abused: Not on file  . Sexually Abused: Not on file      Review of Systems  Constitutional: Positive for unexpected weight change. Negative for chills and fatigue.       Four pound weight gain since her last visit.   HENT: Negative for congestion, postnasal drip, rhinorrhea, sneezing and sore throat.   Respiratory: Negative for cough, chest tightness and shortness of breath.   Cardiovascular: Negative for chest pain and palpitations.  Gastrointestinal: Negative for abdominal pain, constipation, diarrhea, nausea and vomiting.  Endocrine: Negative for cold intolerance, heat intolerance, polydipsia and polyuria.  Genitourinary:       Decreased libido.   Musculoskeletal: Negative for arthralgias, back pain, joint swelling and neck pain.  Skin: Negative for rash.  Allergic/Immunologic: Positive for environmental allergies.  Neurological: Negative for dizziness, tremors, numbness and headaches.  Hematological: Negative for adenopathy. Does not bruise/bleed easily.  Psychiatric/Behavioral: Positive for dysphoric mood. Negative for behavioral problems (Depression), sleep disturbance and suicidal ideas. The patient is nervous/anxious.     Today's Vitals   04/18/19 1109  BP: 136/80  Pulse: 75  Resp: 16  SpO2: 98%  Weight: 189 lb 6.4 oz (85.9 kg)  Height: 5\' 8"  (1.727 m)   Body mass index is 28.8 kg/m.  Physical Exam Vitals and nursing note reviewed.  Constitutional:      General: She is not in acute distress.    Appearance: Normal appearance.  HENT:     Nose: Nose normal.  Eyes:     Extraocular Movements: Extraocular movements intact.     Pupils: Pupils are equal, round, and reactive to light.  Neurological:     Mental Status: She is alert and oriented to person, place, and time.  Psychiatric:        Attention and Perception: Attention normal.        Mood and Affect: Affect normal. Mood is anxious.        Speech: Speech normal.        Behavior: Behavior normal. Behavior is cooperative.        Thought Content: Thought content normal.        Cognition and Memory: Cognition and memory normal.        Judgment: Judgment normal.    Assessment/Plan: 1. Menopausal and perimenopausal disorder Restart progesterone 100mg  daily and use estrogen patches 0.025mg /24 hours patches. Will reassess at next visit.  - progesterone (PROMETRIUM) 100 MG capsule; Take 1 capsule (100 mg total) by mouth daily.  Dispense: 30 capsule; Refill: 5  2. B12 deficiency Vitamin B12 injection administered today.  - cyanocobalamin ((VITAMIN B-12)) injection 1,000 mcg  3. BMI 28.0-28.9,adult Improved. May continue phentermine 37.5mg  tablets. Limit calorie  intake to 1200-1500 calories per day and incorporate exercise into daily routine.  - phentermine (ADIPEX-P) 37.5 MG tablet; Take 1 tablet (37.5 mg total) by mouth daily before breakfast.  Dispense: 30 tablet; Refill: 1  4. Vitamin D deficiency - Vitamin D, Ergocalciferol, (DRISDOL) 1.25 MG (50000 UT) CAPS capsule; Take 1 capsule (50,000 Units total) by mouth every 7 (seven) days.  Dispense: 4 capsule; Refill: 5  General Counseling: Ruth Morales verbalizes understanding of the findings of todays visit and agrees with plan of treatment. I have discussed any further diagnostic evaluation that may be needed or ordered today. We also reviewed her medications today. she has been encouraged to call the office with any questions or concerns that should arise related to todays visit.    There is a liability release in patients' chart. There has been a 10 minute discussion about the side effects including but not limited to elevated blood pressure, anxiety, lack of sleep and dry mouth. Pt understands and will like to start/continue on appetite suppressant at this time. There will be one month RX given at the time of visit with proper follow up. Nova diet plan with restricted calories is given to the pt. Pt understands and agrees with  plan of treatment  This patient was seen by Leretha Pol FNP Collaboration with Dr Lavera Guise as a part of collaborative care agreement  Meds ordered this encounter  Medications  . cyanocobalamin ((VITAMIN B-12)) injection 1,000 mcg  . phentermine (ADIPEX-P) 37.5 MG tablet    Sig: Take 1 tablet (37.5 mg total) by mouth daily before breakfast.    Dispense:  30 tablet    Refill:  1    Order Specific Question:   Supervising Provider    Answer:   Lavera Guise [place]  . Vitamin D, Ergocalciferol, (DRISDOL) 1.25 MG (50000 UT) CAPS capsule    Sig: Take 1 capsule (50,000 Units total) by mouth every 7 (seven) days.    Dispense:  4 capsule    Refill:  5    Order Specific  Question:   Supervising Provider    Answer:   Lavera Guise [id number]  . progesterone (PROMETRIUM) 100 MG capsule    Sig: Take 1 capsule (100 mg total) by mouth daily.    Dispense:  30  capsule    Refill:  5    Order Specific Question:   Supervising Provider    Answer:   Lavera Guise [id number]    Total Time spent: 47 Minutes      Dr Lavera Guise Internal medicine

## 2019-04-21 ENCOUNTER — Other Ambulatory Visit: Payer: Self-pay | Admitting: Nurse Practitioner

## 2019-04-21 DIAGNOSIS — N952 Postmenopausal atrophic vaginitis: Secondary | ICD-10-CM

## 2019-04-21 MED ORDER — PROGESTERONE MICRONIZED 100 MG PO CAPS: 100.0000 mg | ORAL_CAPSULE | Freq: Every day | ORAL | 5 refills | Status: DC

## 2019-04-21 MED ORDER — ESTRADIOL 0.025 MG/24HR TD PTWK: 0.0250 mg | MEDICATED_PATCH | TRANSDERMAL | 12 refills | Status: DC

## 2019-05-15 ENCOUNTER — Telehealth: Payer: Self-pay

## 2019-05-15 NOTE — Telephone Encounter (Signed)
Confirmed virtual visit on 05/17/2019. klh

## 2019-05-17 ENCOUNTER — Ambulatory Visit: Payer: 59 | Admitting: Adult Health

## 2019-05-17 ENCOUNTER — Encounter: Payer: Self-pay | Admitting: Adult Health

## 2019-05-17 VITALS — BP 120/79 | HR 83 | Ht 68.0 in | Wt 185.6 lb

## 2019-05-17 DIAGNOSIS — N959 Unspecified menopausal and perimenopausal disorder: Secondary | ICD-10-CM

## 2019-05-17 DIAGNOSIS — Z6828 Body mass index (BMI) 28.0-28.9, adult: Secondary | ICD-10-CM

## 2019-05-17 DIAGNOSIS — E538 Deficiency of other specified B group vitamins: Secondary | ICD-10-CM

## 2019-05-17 DIAGNOSIS — E559 Vitamin D deficiency, unspecified: Secondary | ICD-10-CM

## 2019-05-17 MED ORDER — PHENTERMINE HCL 37.5 MG PO TABS: 37.5000 mg | ORAL_TABLET | Freq: Every day | ORAL | 1 refills | Status: DC

## 2019-05-17 NOTE — Progress Notes (Signed)
[facility] [place], [place] [address]  Internal MEDICINE  Telephone Visit  Patient Name: Ruth Morales  [id number]  [id number]  Date of Service: 05/17/2019  I connected with the patient at 904 by telephone and verified the patients identity using two identifiers.   I discussed the limitations, risks, security and privacy concerns of performing an evaluation and management service by telephone and the availability of in person appointments. I also discussed with the patient that there may be a patient responsible charge related to the service.  The patient expressed understanding and agrees to proceed.    Chief Complaint  Patient presents with  . Telephone Assessment  . Telephone Screen  . Medical Management of Chronic Issues    weight management    HPI  Pt seen today via telephone.  She is seen for follow up Vitamin deficiency, and weight loss. Last month she started back on her Prometrium patches. She has not noticed a difference at this time, but report she will keep going with this treatment.  She has been taking B12 injections and PO vitamin D. Patient is using phentermine for weight loss.  Since our last visit they have lost 4 pounds.  The patient denies any chest pain, palpitations, shortness of breath, constipation, headaches or any other side effects of the medication.  Patient wishes to continue to use this medication for weight loss at this time.    Current Medication: Outpatient Encounter Medications as of 05/17/2019  Medication Sig  . Calcium Citrate-Vitamin D (CALCIUM CITRATE + D3 MAXIMUM) 315-250 MG-UNIT TABS Take by mouth.  . cyanocobalamin (,VITAMIN B-12,) 1000 MCG/ML injection Inject 1 mL (1,000 mcg total) into the muscle every 30 (thirty) days.  Marland Kitchen estradiol (CLIMARA - DOSED IN MG/24 HR) 0.025 mg/24hr patch Place 1 patch (0.025 mg total) onto the skin once a week.  Marland Kitchen ibuprofen (ADVIL,MOTRIN) 200 MG tablet Take 600 mg by mouth every 6 (six) hours  as needed for headache or moderate pain.  Marland Kitchen loperamide (IMODIUM) 2 MG capsule Take 4 mg by mouth 2 (two) times daily.   . phentermine (ADIPEX-P) 37.5 MG tablet Take 1 tablet (37.5 mg total) by mouth daily before breakfast.  . progesterone (PROMETRIUM) 100 MG capsule Take 1 capsule (100 mg total) by mouth daily.  . Vitamin D, Ergocalciferol, (DRISDOL) 1.25 MG (50000 UT) CAPS capsule Take 1 capsule (50,000 Units total) by mouth every 7 (seven) days.  . [DISCONTINUED] phentermine (ADIPEX-P) 37.5 MG tablet Take 1 tablet (37.5 mg total) by mouth daily before breakfast.   No facility-administered encounter medications on file as of 05/17/2019.    Surgical History: Past Surgical History:  Procedure Laterality Date  . BREAST BIOPSY Left 2003   needle bx-neg  . BREAST SURGERY Bilateral 2004   breast reduction  . CATARACT EXTRACTION W/PHACO Left 02/06/2018   Procedure: CATARACT EXTRACTION PHACO AND INTRAOCULAR LENS PLACEMENT (IOC);  Surgeon: Birder Robson, MD;  Location: [facility] ORS;  Service: Ophthalmology;  Laterality: Left;  [place] 00:31.5 CDE 3.49 Fluid pack Lot # [id number]  . COLON SURGERY  2004   colon removed  . CTR    . EXTRACORPOREAL SHOCK WAVE LITHOTRIPSY Right 02/26/2015   Procedure: EXTRACORPOREAL SHOCK WAVE LITHOTRIPSY (ESWL);  Surgeon: Collier Flowers, MD;  Location: [facility] ORS;  Service: Urology;  Laterality: Right;  . REDUCTION MAMMAPLASTY Bilateral 2005    Medical History: Past Medical History:  Diagnosis Date  . Anxiety 11/21/2013  . Breath shortness 11/21/2013  . Crohn  disease ([place])   . Dizziness 11/21/2013  . Extensor tenosynovitis of wrist 12/24/2013  . Ganglion cyst of wrist 12/24/2013  . Hypotension, postural 11/22/2013  . Psoriasis   . Skin cancer     Family History: Family History  Problem Relation Age of Onset  . Diabetes Father   . Diabetes Mother   . Heart disease Maternal Grandmother   . Breast cancer Neg Hx     Social History   Socioeconomic History  .  Marital status: Married    Spouse name: Not on file  . Number of children: Not on file  . Years of education: Not on file  . Highest education level: Not on file  Occupational History  . Not on file  Tobacco Use  . Smoking status: Former Research scientist (life sciences)  . Smokeless tobacco: Never Used  Substance and Sexual Activity  . Alcohol use: Yes    Comment: occasional drink, once a month  . Drug use: No  . Sexual activity: Yes    Partners: Male  Other Topics Concern  . Not on file  Social History Narrative  . Not on file   Social Determinants of Health   Financial Resource Strain:   . Difficulty of Paying Living Expenses: Not on file  Food Insecurity:   . Worried About Charity fundraiser in the Last Year: Not on file  . Ran Out of Food in the Last Year: Not on file  Transportation Needs:   . Lack of Transportation (Medical): Not on file  . Lack of Transportation (Non-Medical): Not on file  Physical Activity:   . Days of Exercise per Week: Not on file  . Minutes of Exercise per Session: Not on file  Stress:   . Feeling of Stress : Not on file  Social Connections:   . Frequency of Communication with Friends and Family: Not on file  . Frequency of Social Gatherings with Friends and Family: Not on file  . Attends Religious Services: Not on file  . Active Member of Clubs or Organizations: Not on file  . Attends Archivist Meetings: Not on file  . Marital Status: Not on file  Intimate Partner Violence:   . Fear of Current or Ex-Partner: Not on file  . Emotionally Abused: Not on file  . Physically Abused: Not on file  . Sexually Abused: Not on file      Review of Systems  Constitutional: Negative for chills, fatigue and unexpected weight change.  HENT: Negative for congestion, rhinorrhea, sneezing and sore throat.   Eyes: Negative for photophobia, pain and redness.  Respiratory: Negative for cough, chest tightness and shortness of breath.   Cardiovascular: Negative for chest  pain and palpitations.  Gastrointestinal: Negative for abdominal pain, constipation, diarrhea, nausea and vomiting.  Endocrine: Negative.   Genitourinary: Negative for dysuria and frequency.  Musculoskeletal: Negative for arthralgias, back pain, joint swelling and neck pain.  Skin: Negative for rash.  Allergic/Immunologic: Negative.   Neurological: Negative for tremors and numbness.  Hematological: Negative for adenopathy. Does not bruise/bleed easily.  Psychiatric/Behavioral: Negative for behavioral problems and sleep disturbance. The patient is not nervous/anxious.     Vital Signs: BP 120/79   Pulse 83   Ht 5\' 8"  (1.727 m)   Wt 185 lb 9.6 oz (84.2 kg)   BMI 28.22 kg/m    Observation/Objective:  Well sounding, NAD at this time   Assessment/Plan: 1. Vitamin D deficiency Pt continues to use Vit D supplement weekly.  She is  doing well with this.   2. BMI 28.0-28.9,adult Obesity Counseling: Risk Assessment: An assessment of behavioral risk factors was made today and includes lack of exercise sedentary lifestyle, lack of portion control and poor dietary habits.  Risk Modification Advice: She was counseled on portion control guidelines. Restricting daily caloric intake to 1600. The detrimental long term effects of obesity on her health and ongoing poor compliance was also discussed with the patient.  There is a liability release in patients' chart. There has been a 10 minute discussion about the side effects including but not limited to elevated blood pressure, anxiety, lack of sleep and dry mouth. Pt understands and will like to start/continue on appetite suppressant at this time. There will be one month RX given at the time of visit with proper follow up. [place] diet plan with restricted calories is given to the pt. Pt understands and agrees with  plan of treatment  - phentermine (ADIPEX-P) 37.5 MG tablet; Take 1 tablet (37.5 mg total) by mouth daily before breakfast.  Dispense: 30  tablet; Refill: 1  3. B12 deficiency Receiving injections, no issues at this time.   4. Menopausal and perimenopausal disorder Continue medications as directed.   General Counseling: Loan verbalizes understanding of the findings of today's phone visit and agrees with plan of treatment. I have discussed any further diagnostic evaluation that may be needed or ordered today. We also reviewed her medications today. she has been encouraged to call the office with any questions or concerns that should arise related to todays visit.    No orders of the defined types were placed in this encounter.   Meds ordered this encounter  Medications  . phentermine (ADIPEX-P) 37.5 MG tablet    Sig: Take 1 tablet (37.5 mg total) by mouth daily before breakfast.    Dispense:  30 tablet    Refill:  1    Time spent: [place] AGNP-C Internal medicine

## 2019-07-09 ENCOUNTER — Telehealth: Payer: Self-pay

## 2019-07-09 NOTE — Telephone Encounter (Signed)
VM FULL UNABLE TO CONFIRM AND SCREEN FOR 07-11-19 OV.

## 2019-07-11 ENCOUNTER — Ambulatory Visit: Payer: 59 | Admitting: Nurse Practitioner

## 2019-07-22 ENCOUNTER — Telehealth: Payer: Self-pay

## 2019-07-22 NOTE — Telephone Encounter (Signed)
UNABLE TO REACH PT REGARDING MISSED APPT. 

## 2019-10-16 ENCOUNTER — Other Ambulatory Visit: Payer: Self-pay

## 2019-10-16 ENCOUNTER — Ambulatory Visit
Admission: EM | Admit: 2019-10-16 | Discharge: 2019-10-16 | Disposition: A | Discharge status: Home / Self Care | Payer: 59 | Attending: Emergency Medicine | Admitting: Emergency Medicine

## 2019-10-16 DIAGNOSIS — H6691 Otitis media, unspecified, right ear: Secondary | ICD-10-CM | POA: Diagnosis not present

## 2019-10-16 MED ORDER — AZITHROMYCIN 250 MG PO TABS: ORAL_TABLET | ORAL | 0 refills | Status: DC

## 2019-10-16 NOTE — ED Triage Notes (Signed)
Pt c/o right ear pain and "a little bit of vertigo" x 2-3 days. Denies fever, drainage, hearing loss

## 2019-10-16 NOTE — Discharge Instructions (Addendum)
Take medication as prescribed. Rest. Drink plenty of fluids. Over the counter claritin.   Follow up with your primary care physician this week as needed. Return to Urgent care for new or worsening concerns.

## 2019-10-16 NOTE — ED Provider Notes (Signed)
[facility] ____________________________________________  Time seen: Approximately 9:02 AM  I have reviewed the triage vital signs and the nursing notes.   HISTORY  Chief Complaint Otalgia   HPI Ruth Morales is a 59 y.o. female for evaluation of right ear discomfort present for the last few days.  Patient reports ear pain is mild but persisting.  Patient reports that she occasionally feels that it causes some mild "vertigo ".  States she has had vertigo in the past and she has intermittently had slight spinning sensation with movement change that lasts only for a few seconds but is not constantly present.  States pain to right ear is mild at this time.  Denies other pain.  Denies paresthesias, weakness, chest pain, shortness of breath, vision changes.  Reports otherwise doing well.  Denies alleviating measures.  Denies other being factors.  Denies recent cough or sickness.  Lavera Guise, MD : PCP   Past Medical History:  Diagnosis Date  . Anxiety 11/21/2013  . Breath shortness 11/21/2013  . Crohn disease ([place])   . Dizziness 11/21/2013  . Extensor tenosynovitis of wrist 12/24/2013  . Ganglion cyst of wrist 12/24/2013  . Hypotension, postural 11/22/2013  . Psoriasis   . Skin cancer     Patient Active Problem List   Diagnosis Date Noted  . Other fatigue 03/24/2019  . Screening for breast cancer 12/25/2017  . History of ulcerative colitis 12/25/2017  . Screening for malignant neoplasm of cervix 12/25/2017  . Ovarian failure 12/25/2017  . Psoriasis 12/25/2017  . Pelvic pain 08/21/2017  . Dysuria 08/02/2017  . Urinary tract infection with hematuria 08/02/2017  . Menopausal and perimenopausal disorder 05/26/2017  . BMI 29.0-29.9,adult 04/14/2017  . Candidiasis of vulva and vagina 04/14/2017  . Mixed hyperlipidemia 04/14/2017  . Syncope and collapse 04/14/2017  . Low back pain 04/14/2017  . Vitamin D deficiency 04/14/2017  . Vitamin B12 deficiency anemia, unspecified  04/14/2017  . B12 deficiency 04/14/2017  . Vascular headache, not elsewhere classified 04/14/2017  . Extensor tenosynovitis of wrist 12/24/2013  . Ganglion cyst of wrist 12/24/2013  . Hypotension, postural 11/22/2013  . Anxiety 11/21/2013  . Dizziness 11/21/2013  . Breath shortness 11/21/2013    Past Surgical History:  Procedure Laterality Date  . BREAST BIOPSY Left 2003   needle bx-neg  . BREAST SURGERY Bilateral 2004   breast reduction  . CATARACT EXTRACTION W/PHACO Left 02/06/2018   Procedure: CATARACT EXTRACTION PHACO AND INTRAOCULAR LENS PLACEMENT (IOC);  Surgeon: Birder Robson, MD;  Location: [facility] ORS;  Service: Ophthalmology;  Laterality: Left;  [place] 00:31.5 CDE 3.49 Fluid pack Lot # [id number]  . COLON SURGERY  2004   colon removed  . CTR    . EXTRACORPOREAL SHOCK WAVE LITHOTRIPSY Right 02/26/2015   Procedure: EXTRACORPOREAL SHOCK WAVE LITHOTRIPSY (ESWL);  Surgeon: Collier Flowers, MD;  Location: [facility] ORS;  Service: Urology;  Laterality: Right;  . REDUCTION MAMMAPLASTY Bilateral 2005     No current facility-administered medications for this encounter.  Current Outpatient Medications:  .  azithromycin (ZITHROMAX Z-PAK) 250 MG tablet, Take 2 tablets (500 mg) on  Day 1,  followed by 1 tablet (250 mg) once daily on Days 2 through 5., Disp: 6 each, Rfl: 0 .  Calcium Citrate-Vitamin D (CALCIUM CITRATE + D3 MAXIMUM) 315-250 MG-UNIT TABS, Take by mouth., Disp: , Rfl:  .  estradiol (CLIMARA - DOSED IN MG/24 HR) 0.025 mg/24hr patch, Place 1 patch (0.025 mg total) onto the skin once a week.,  Disp: 4 patch, Rfl: 12 .  ibuprofen (ADVIL,MOTRIN) 200 MG tablet, Take 600 mg by mouth every 6 (six) hours as needed for headache or moderate pain., Disp: , Rfl:  .  loperamide (IMODIUM) 2 MG capsule, Take 4 mg by mouth 2 (two) times daily. , Disp: , Rfl:  .  progesterone (PROMETRIUM) 100 MG capsule, Take 1 capsule (100 mg total) by mouth daily., Disp: 30 capsule, Rfl: 5 .  Vitamin D,  Ergocalciferol, (DRISDOL) 1.25 MG (50000 UT) CAPS capsule, Take 1 capsule (50,000 Units total) by mouth every 7 (seven) days., Disp: 4 capsule, Rfl: 5  Allergies Morphine, Penicillins, Macrobid [nitrofurantoin macrocrystal], and Benadryl [diphenhydramine hcl]  Family History  Problem Relation Age of Onset  . Diabetes Father   . Diabetes Mother   . Heart disease Maternal Grandmother   . Breast cancer Neg Hx     Social History Social History   Tobacco Use  . Smoking status: Former Research scientist (life sciences)  . Smokeless tobacco: Never Used  Vaping Use  . Vaping Use: Never used  Substance Use Topics  . Alcohol use: Yes    Comment: occasional drink, once a month  . Drug use: No    Review of Systems Constitutional: No fever. Eyes: No vision changes.  ENT: No sore throat. As above.  Cardiovascular: Denies chest pain. Respiratory: Denies shortness of breath. Gastrointestinal: No abdominal pain.  No nausea, no vomiting. Musculoskeletal: Negative for back pain. Skin: Negative for rash. Neurological: Negative for focal weakness or numbness.   ____________________________________________   PHYSICAL EXAM:  VITAL SIGNS: ED Triage Vitals [10/16/19 0822]  Enc Vitals Group     BP (!) 149/79     Pulse Rate 65     Resp 16     Temp 98 F (36.7 C)     Temp src      SpO2 100 %     Weight      Height      Head Circumference      Peak Flow      Pain Score 3     Pain Loc      Pain Edu?      Excl. in [place]?     Constitutional: Alert and oriented. Well appearing and in no acute distress. Eyes: Conjunctivae are normal. PERRL. EOMI. No nystagmus.  ENT      Head: Normocephalic and atraumatic.      Ears: Left: Nontender, normal canal, no erythema, normal TM.  Right: Nontender, normal canal, moderate erythema, dull TM. Hematological/Lymphatic/Immunilogical: No cervical lymphadenopathy. Cardiovascular: Normal rate, regular rhythm. Grossly normal heart sounds.  Good peripheral  circulation. Respiratory: Normal respiratory effort without tachypnea nor retractions. Breath sounds are clear and equal bilaterally. No wheezes, rales, rhonchi. Musculoskeletal: Steady gait. Neurologic:  Normal speech and language. No gross focal neurologic deficits are appreciated. Speech is normal. No gait instability.  No paresthesias.  No ataxia. Skin:  Skin is warm, dry and intact. No rash noted. Psychiatric: Mood and affect are normal. Speech and behavior are normal. Patient exhibits appropriate insight and judgment   ___________________________________________   LABS (all labs ordered are listed, but only abnormal results are displayed)  Labs Reviewed - No data to display ____________________________________________   PROCEDURES Procedures    INITIAL IMPRESSION / ASSESSMENT AND PLAN / ED COURSE  Pertinent labs & imaging results that were available during my care of the patient were reviewed by me and considered in my medical decision making (see chart for details).  Well-appearing patient.  No acute distress.  Right otitis.  Penicillin allergic.  Will treat with oral azithromycin.  Over-the-counter Claritin antihistamine.  Supportive care.  Monitor.Discussed indication, risks and benefits of medications with patient.   Discussed follow up with Primary care physician this week. Discussed follow up and return parameters including no resolution or any worsening concerns. Patient verbalized understanding and agreed to plan.   ____________________________________________   FINAL CLINICAL IMPRESSION(S) / ED DIAGNOSES  Final diagnoses:  Right otitis media, unspecified otitis media type     ED Discharge Orders         Ordered    azithromycin (ZITHROMAX Z-PAK) 250 MG tablet     Discontinue  Reprint     10/16/19 0842           Note: This dictation was prepared with Dragon dictation along with smaller phrase technology. Any transcriptional errors that result from this  process are unintentional.         Marylene Land, NP 10/16/19 [phone]

## 2019-11-28 ENCOUNTER — Ambulatory Visit: Payer: 59 | Admitting: Internal Medicine

## 2019-11-28 ENCOUNTER — Encounter: Payer: Self-pay | Admitting: Hospice and Palliative Medicine

## 2019-11-28 VITALS — Resp 16 | Ht 69.0 in | Wt 184.0 lb

## 2019-11-28 DIAGNOSIS — U071 COVID-19: Secondary | ICD-10-CM | POA: Diagnosis not present

## 2019-11-28 DIAGNOSIS — R110 Nausea: Secondary | ICD-10-CM | POA: Diagnosis not present

## 2019-11-28 DIAGNOSIS — R5383 Other fatigue: Secondary | ICD-10-CM | POA: Diagnosis not present

## 2019-11-28 MED ORDER — PROMETHAZINE HCL 12.5 MG PO TABS: 12.5000 mg | ORAL_TABLET | Freq: Three times a day (TID) | ORAL | 0 refills | Status: DC | PRN

## 2019-11-28 NOTE — Progress Notes (Signed)
[facility] [place], [place] [address]  Internal MEDICINE  Office Visit Note  Patient Name: Ruth Morales  [id number]  [id number]  Date of Service: 12/09/2019  Chief Complaint  Patient presents with  . Telephone Assessment  . Telephone Screen  . Covid Exposure    pt tested positive on friday for covid  . Headache  . Fatigue  . Nausea  . no taste or smell    HPI  Pt is connected due to her Covid 19 positive test. She denies any sob at this time, but has poor oral intake due to nausea. No fever or chills, just feels tired.   Current Medication: Outpatient Encounter Medications as of 11/28/2019  Medication Sig  . Calcium Citrate-Vitamin D (CALCIUM CITRATE + D3 MAXIMUM) 315-250 MG-UNIT TABS Take by mouth.  . loperamide (IMODIUM) 2 MG capsule Take 4 mg by mouth 2 (two) times daily.   . progesterone (PROMETRIUM) 100 MG capsule Take 1 capsule (100 mg total) by mouth daily.  . Vitamin D, Ergocalciferol, (DRISDOL) 1.25 MG (50000 UT) CAPS capsule Take 1 capsule (50,000 Units total) by mouth every 7 (seven) days.  . [DISCONTINUED] estradiol (CLIMARA - DOSED IN MG/24 HR) 0.025 mg/24hr patch Place 1 patch (0.025 mg total) onto the skin once a week.  Marland Kitchen ibuprofen (ADVIL,MOTRIN) 200 MG tablet Take 600 mg by mouth every 6 (six) hours as needed for headache or moderate pain.   . promethazine (PHENERGAN) 12.5 MG tablet Take 1 tablet (12.5 mg total) by mouth every 8 (eight) hours as needed for nausea or vomiting.  . [DISCONTINUED] azithromycin (ZITHROMAX Z-PAK) 250 MG tablet Take 2 tablets (500 mg) on  Day 1,  followed by 1 tablet (250 mg) once daily on Days 2 through 5. (Patient not taking: Reported on 11/28/2019)   No facility-administered encounter medications on file as of 11/28/2019.    Surgical History: Past Surgical History:  Procedure Laterality Date  . BREAST BIOPSY Left 2003   needle bx-neg  . BREAST SURGERY Bilateral 2004   breast reduction  . CATARACT  EXTRACTION W/PHACO Left 02/06/2018   Procedure: CATARACT EXTRACTION PHACO AND INTRAOCULAR LENS PLACEMENT (IOC);  Surgeon: Birder Robson, MD;  Location: [facility] ORS;  Service: Ophthalmology;  Laterality: Left;  [place] 00:31.5 CDE 3.49 Fluid pack Lot # [id number]  . COLON SURGERY  2004   colon removed  . CTR    . EXTRACORPOREAL SHOCK WAVE LITHOTRIPSY Right 02/26/2015   Procedure: EXTRACORPOREAL SHOCK WAVE LITHOTRIPSY (ESWL);  Surgeon: Collier Flowers, MD;  Location: [facility] ORS;  Service: Urology;  Laterality: Right;  . REDUCTION MAMMAPLASTY Bilateral 2005    Medical History: Past Medical History:  Diagnosis Date  . Anxiety 11/21/2013  . Breath shortness 11/21/2013  . Crohn disease ([place])   . Dizziness 11/21/2013  . Extensor tenosynovitis of wrist 12/24/2013  . Ganglion cyst of wrist 12/24/2013  . Hypotension, postural 11/22/2013  . Psoriasis   . Skin cancer     Family History: Family History  Problem Relation Age of Onset  . Diabetes Father   . Diabetes Mother   . Heart disease Maternal Grandmother   . Breast cancer Neg Hx     Social History   Socioeconomic History  . Marital status: Married    Spouse name: Not on file  . Number of children: Not on file  . Years of education: Not on file  . Highest education level: Not on file  Occupational History  . Not on file  Tobacco Use  . Smoking status: Former Research scientist (life sciences)  . Smokeless tobacco: Never Used  Vaping Use  . Vaping Use: Never used  Substance and Sexual Activity  . Alcohol use: Yes    Comment: occasional drink, once a month  . Drug use: No  . Sexual activity: Yes    Partners: Male  Other Topics Concern  . Not on file  Social History Narrative  . Not on file   Social Determinants of Health   Financial Resource Strain:   . Difficulty of Paying Living Expenses: Not on file  Food Insecurity:   . Worried About Charity fundraiser in the Last Year: Not on file  . Ran Out of Food in the Last Year: Not on file  Transportation  Needs:   . Lack of Transportation (Medical): Not on file  . Lack of Transportation (Non-Medical): Not on file  Physical Activity:   . Days of Exercise per Week: Not on file  . Minutes of Exercise per Session: Not on file  Stress:   . Feeling of Stress : Not on file  Social Connections:   . Frequency of Communication with Friends and Family: Not on file  . Frequency of Social Gatherings with Friends and Family: Not on file  . Attends Religious Services: Not on file  . Active Member of Clubs or Organizations: Not on file  . Attends Archivist Meetings: Not on file  . Marital Status: Not on file  Intimate Partner Violence:   . Fear of Current or Ex-Partner: Not on file  . Emotionally Abused: Not on file  . Physically Abused: Not on file  . Sexually Abused: Not on file      Review of Systems  Constitutional: Positive for activity change, appetite change and fatigue.  Respiratory: Negative.  Negative for shortness of breath.   Cardiovascular: Negative.   Gastrointestinal: Positive for nausea. Negative for diarrhea and vomiting.    Vital Signs: Resp 16   Ht 5\' 9"  (1.753 m)   Wt 184 lb (83.5 kg)   BMI 27.17 kg/m    Physical Exam Pt is able to communicate well. NAD   Assessment/Plan: 1. COVID-19 virus detected - she will continue to quarantine at home, stay hydrated and monitor symptoms    2. Nausea - promethazine (PHENERGAN) 12.5 MG tablet; Take 1 tablet (12.5 mg total) by mouth every 8 (eight) hours as needed for nausea or vomiting.  Dispense: 20 tablet; Refill: 0  3. Other fatigue - Pt is to rest at home, monitor symptoms   General Counseling: Victor verbalizes understanding of the findings of todays visit and agrees with plan of treatment. I have discussed any further diagnostic evaluation that may be needed or ordered today. We also reviewed her medications today. she has been encouraged to call the office with any questions or concerns that should arise  related to todays visit.   Meds ordered this encounter  Medications  . promethazine (PHENERGAN) 12.5 MG tablet    Sig: Take 1 tablet (12.5 mg total) by mouth every 8 (eight) hours as needed for nausea or vomiting.    Dispense:  20 tablet    Refill:  0    Total time spent:20 Minutes Time spent includes review of chart, medications, test results, and follow up plan with the patient.      Dr Lavera Guise Internal medicine

## 2019-12-03 ENCOUNTER — Other Ambulatory Visit: Payer: Self-pay

## 2019-12-03 ENCOUNTER — Ambulatory Visit (INDEPENDENT_AMBULATORY_CARE_PROVIDER_SITE_OTHER)
Admission: EM | Admit: 2019-12-03 | Discharge: 2019-12-03 | Disposition: A | Discharge status: Other Acute Inpatient Hospital | Payer: 59 | Source: Home / Self Care | Attending: Family Medicine | Admitting: Family Medicine

## 2019-12-03 ENCOUNTER — Ambulatory Visit (INDEPENDENT_AMBULATORY_CARE_PROVIDER_SITE_OTHER): Payer: 59

## 2019-12-03 ENCOUNTER — Inpatient Hospital Stay
Admission: EM | Admit: 2019-12-03 | Discharge: 2019-12-06 | DRG: 177 | Disposition: A | Discharge status: Home / Self Care | Payer: 59 | Attending: Internal Medicine | Admitting: Internal Medicine

## 2019-12-03 ENCOUNTER — Encounter: Payer: Self-pay | Admitting: Emergency Medicine

## 2019-12-03 DIAGNOSIS — E782 Mixed hyperlipidemia: Secondary | ICD-10-CM | POA: Diagnosis present

## 2019-12-03 DIAGNOSIS — Z833 Family history of diabetes mellitus: Secondary | ICD-10-CM

## 2019-12-03 DIAGNOSIS — E538 Deficiency of other specified B group vitamins: Secondary | ICD-10-CM | POA: Diagnosis present

## 2019-12-03 DIAGNOSIS — K5090 Crohn's disease, unspecified, without complications: Secondary | ICD-10-CM | POA: Diagnosis present

## 2019-12-03 DIAGNOSIS — U071 COVID-19: Secondary | ICD-10-CM

## 2019-12-03 DIAGNOSIS — R05 Cough: Secondary | ICD-10-CM

## 2019-12-03 DIAGNOSIS — Z79899 Other long term (current) drug therapy: Secondary | ICD-10-CM

## 2019-12-03 DIAGNOSIS — J9600 Acute respiratory failure, unspecified whether with hypoxia or hypercapnia: Secondary | ICD-10-CM

## 2019-12-03 DIAGNOSIS — Z880 Allergy status to penicillin: Secondary | ICD-10-CM

## 2019-12-03 DIAGNOSIS — R0602 Shortness of breath: Secondary | ICD-10-CM

## 2019-12-03 DIAGNOSIS — F419 Anxiety disorder, unspecified: Secondary | ICD-10-CM | POA: Diagnosis present

## 2019-12-03 DIAGNOSIS — Z8249 Family history of ischemic heart disease and other diseases of the circulatory system: Secondary | ICD-10-CM

## 2019-12-03 DIAGNOSIS — J9601 Acute respiratory failure with hypoxia: Secondary | ICD-10-CM | POA: Insufficient documentation

## 2019-12-03 DIAGNOSIS — Z87891 Personal history of nicotine dependence: Secondary | ICD-10-CM

## 2019-12-03 DIAGNOSIS — J1282 Pneumonia due to coronavirus disease 2019: Secondary | ICD-10-CM

## 2019-12-03 DIAGNOSIS — Z9049 Acquired absence of other specified parts of digestive tract: Secondary | ICD-10-CM

## 2019-12-03 DIAGNOSIS — Z885 Allergy status to narcotic agent status: Secondary | ICD-10-CM

## 2019-12-03 DIAGNOSIS — E559 Vitamin D deficiency, unspecified: Secondary | ICD-10-CM | POA: Diagnosis present

## 2019-12-03 DIAGNOSIS — R0902 Hypoxemia: Secondary | ICD-10-CM | POA: Diagnosis present

## 2019-12-03 DIAGNOSIS — G43909 Migraine, unspecified, not intractable, without status migrainosus: Secondary | ICD-10-CM | POA: Diagnosis present

## 2019-12-03 DIAGNOSIS — Z888 Allergy status to other drugs, medicaments and biological substances status: Secondary | ICD-10-CM

## 2019-12-03 DIAGNOSIS — Z8719 Personal history of other diseases of the digestive system: Secondary | ICD-10-CM

## 2019-12-03 LAB — CBC WITH DIFFERENTIAL/PLATELET
Abs Immature Granulocytes: 0.05 10*3/uL (ref 0.00–0.07)
Basophils Absolute: 0 10*3/uL (ref 0.0–0.1)
Basophils Relative: 0 %
Eosinophils Absolute: 0 10*3/uL (ref 0.0–0.5)
Eosinophils Relative: 1 %
HCT: 38.2 % (ref 36.0–46.0)
Hemoglobin: 12.8 g/dL (ref 12.0–15.0)
Immature Granulocytes: 1 %
Lymphocytes Relative: 16 %
Lymphs Abs: 0.7 10*3/uL (ref 0.7–4.0)
MCH: 30.6 pg (ref 26.0–34.0)
MCHC: 33.5 g/dL (ref 30.0–36.0)
MCV: 91.4 fL (ref 80.0–100.0)
Monocytes Absolute: 0.3 10*3/uL (ref 0.1–1.0)
Monocytes Relative: 6 %
Neutro Abs: 3.2 10*3/uL (ref 1.7–7.7)
Neutrophils Relative %: 76 %
Platelets: 221 10*3/uL (ref 150–400)
RBC: 4.18 MIL/uL (ref 3.87–5.11)
RDW: 12.6 % (ref 11.5–15.5)
WBC: 4.2 10*3/uL (ref 4.0–10.5)
nRBC: 0 % (ref 0.0–0.2)

## 2019-12-03 LAB — COMPREHENSIVE METABOLIC PANEL
ALT: 36 U/L (ref 0–44)
AST: 45 U/L — ABNORMAL HIGH (ref 15–41)
Albumin: 3.8 g/dL (ref 3.5–5.0)
Alkaline Phosphatase: 163 U/L — ABNORMAL HIGH (ref 38–126)
Anion gap: 11 (ref 5–15)
BUN: 10 mg/dL (ref 6–20)
CO2: 27 mmol/L (ref 22–32)
Calcium: 8.8 mg/dL — ABNORMAL LOW (ref 8.9–10.3)
Chloride: 100 mmol/L (ref 98–111)
Creatinine, Ser: 0.75 mg/dL (ref 0.44–1.00)
GFR calc Af Amer: >60 mL/min (ref >60)
GFR calc non Af Amer: >60 mL/min (ref >60)
Glucose, Bld: 105 mg/dL — ABNORMAL HIGH (ref 70–99)
Potassium: 3.6 mmol/L (ref 3.5–5.1)
Sodium: 138 mmol/L (ref 135–145)
Total Bilirubin: 0.9 mg/dL (ref 0.3–1.2)
Total Protein: 7.6 g/dL (ref 6.5–8.1)

## 2019-12-03 MED ORDER — SODIUM CHLORIDE 0.9 % IV BOLUS
1000.0000 mL | Freq: Once | INTRAVENOUS | Status: AC
  Administered 2019-12-03: 1000 mL via INTRAVENOUS

## 2019-12-03 NOTE — ED Notes (Signed)
EMS called to transport patient to the ER.

## 2019-12-03 NOTE — ED Notes (Signed)
Walked patient with pulse ox. Patient's oxygen level dropped to 82%. Placed on 2L oxygen via [place]. Oxygen improved to 93%.

## 2019-12-03 NOTE — ED Provider Notes (Signed)
[facility]    CSN: [id number] Arrival date & time: 12/03/19  1448      History   Chief Complaint Chief Complaint  Patient presents with  . Cough    COVID Positive on 8/13   HPI  59 year old female presents with cough, shortness of breath, weakness, fatigue.  Patient diagnosed with COVID-19 on 8/13.  Patient reports that she is now worsening.  She is having the above symptoms.  Oxygen saturation has been in the low to mid 90s on room air.  Drop when she exerts herself.  She feels very poorly.  No fever.  No relieving factors.  Patient reports that her pain is currently 4/10 in severity.  Past Medical History:  Diagnosis Date  . Anxiety 11/21/2013  . Breath shortness 11/21/2013  . Crohn disease ([place])   . Dizziness 11/21/2013  . Extensor tenosynovitis of wrist 12/24/2013  . Ganglion cyst of wrist 12/24/2013  . Hypotension, postural 11/22/2013  . Psoriasis   . Skin cancer     Patient Active Problem List   Diagnosis Date Noted  . Other fatigue 03/24/2019  . Screening for breast cancer 12/25/2017  . History of ulcerative colitis 12/25/2017  . Screening for malignant neoplasm of cervix 12/25/2017  . Ovarian failure 12/25/2017  . Psoriasis 12/25/2017  . Pelvic pain 08/21/2017  . Dysuria 08/02/2017  . Urinary tract infection with hematuria 08/02/2017  . Menopausal and perimenopausal disorder 05/26/2017  . BMI 29.0-29.9,adult 04/14/2017  . Candidiasis of vulva and vagina 04/14/2017  . Mixed hyperlipidemia 04/14/2017  . Syncope and collapse 04/14/2017  . Low back pain 04/14/2017  . Vitamin D deficiency 04/14/2017  . Vitamin B12 deficiency anemia, unspecified 04/14/2017  . B12 deficiency 04/14/2017  . Vascular headache, not elsewhere classified 04/14/2017  . Extensor tenosynovitis of wrist 12/24/2013  . Ganglion cyst of wrist 12/24/2013  . Hypotension, postural 11/22/2013  . Anxiety 11/21/2013  . Dizziness 11/21/2013  . Breath shortness 11/21/2013    Past  Surgical History:  Procedure Laterality Date  . BREAST BIOPSY Left 2003   needle bx-neg  . BREAST SURGERY Bilateral 2004   breast reduction  . CATARACT EXTRACTION W/PHACO Left 02/06/2018   Procedure: CATARACT EXTRACTION PHACO AND INTRAOCULAR LENS PLACEMENT (IOC);  Surgeon: Birder Robson, MD;  Location: [facility] ORS;  Service: Ophthalmology;  Laterality: Left;  [place] 00:31.5 CDE 3.49 Fluid pack Lot # [id number]  . COLON SURGERY  2004   colon removed  . CTR    . EXTRACORPOREAL SHOCK WAVE LITHOTRIPSY Right 02/26/2015   Procedure: EXTRACORPOREAL SHOCK WAVE LITHOTRIPSY (ESWL);  Surgeon: Collier Flowers, MD;  Location: [facility] ORS;  Service: Urology;  Laterality: Right;  . REDUCTION MAMMAPLASTY Bilateral 2005    OB History   No obstetric history on file.      Home Medications    Prior to Admission medications   Medication Sig Start Date End Date Taking? Authorizing Provider  Calcium Citrate-Vitamin D (CALCIUM CITRATE + D3 MAXIMUM) 315-250 MG-UNIT TABS Take by mouth.   Yes [provider]  estradiol (CLIMARA - DOSED IN MG/24 HR) 0.025 mg/24hr patch Place 1 patch (0.025 mg total) onto the skin once a week. 04/21/19  Yes Boscia, Greer Ee, NP  ibuprofen (ADVIL,MOTRIN) 200 MG tablet Take 600 mg by mouth every 6 (six) hours as needed for headache or moderate pain.    Yes [provider]  loperamide (IMODIUM) 2 MG capsule Take 4 mg by mouth 2 (two) times daily.    Yes [provider]  progesterone (PROMETRIUM) 100 MG capsule Take 1 capsule (100 mg total) by mouth daily. 04/21/19  Yes Ronnell Freshwater, NP  promethazine (PHENERGAN) 12.5 MG tablet Take 1 tablet (12.5 mg total) by mouth every 8 (eight) hours as needed for nausea or vomiting. 11/28/19  Yes Lavera Guise, MD  Vitamin D, Ergocalciferol, (DRISDOL) 1.25 MG (50000 UT) CAPS capsule Take 1 capsule (50,000 Units total) by mouth every 7 (seven) days. 04/18/19  Yes Ronnell Freshwater, NP    Family History Family History    Problem Relation Age of Onset  . Diabetes Father   . Diabetes Mother   . Heart disease Maternal Grandmother   . Breast cancer Neg Hx     Social History Social History   Tobacco Use  . Smoking status: Former Research scientist (life sciences)  . Smokeless tobacco: Never Used  Vaping Use  . Vaping Use: Never used  Substance Use Topics  . Alcohol use: Yes    Comment: occasional drink, once a month  . Drug use: No     Allergies   Morphine, Penicillins, Macrobid [nitrofurantoin macrocrystal], and Benadryl [diphenhydramine hcl]   Review of Systems Review of Systems  Constitutional: Positive for fatigue. Negative for fever.  Respiratory: Positive for cough and shortness of breath.   Neurological: Positive for weakness.   Physical Exam Triage Vital Signs ED Triage Vitals  Enc Vitals Group     BP 12/03/19 1534 (!) 161/98     Pulse Rate 12/03/19 1534 82     Resp 12/03/19 1534 20     Temp 12/03/19 1534 98.6 F (37 C)     Temp Source 12/03/19 1534 Oral     SpO2 12/03/19 1534 95 %     Weight 12/03/19 1536 182 lb (82.6 kg)     Height 12/03/19 1536 5' 8.5" (1.74 m)     Head Circumference --      Peak Flow --      Pain Score 12/03/19 1536 4     Pain Loc --      Pain Edu? --      Excl. in [place]? --    Updated Vital Signs BP (!) 160/90   Pulse (!) 112   Temp 98.6 F (37 C) (Oral)   Resp 20   Ht 5' 8.5" (1.74 m)   Wt 82.6 kg   SpO2 92%   BMI 27.27 kg/m   Visual Acuity Right Eye Distance:   Left Eye Distance:   Bilateral Distance:    Right Eye Near:   Left Eye Near:    Bilateral Near:     Physical Exam Constitutional:      Appearance: She is ill-appearing.  HENT:     Head: Normocephalic and atraumatic.  Eyes:     General:        Right eye: No discharge.        Left eye: No discharge.     Conjunctiva/sclera: Conjunctivae normal.  Cardiovascular:     Rate and Rhythm: Regular rhythm. Tachycardia present.  Pulmonary:     Effort: Pulmonary effort is normal.     Comments: Crackles  noted. Abdominal:     General: There is no distension.     Palpations: Abdomen is soft.     Tenderness: There is no abdominal tenderness.  Neurological:     Mental Status: She is alert.  Psychiatric:        Mood and Affect: Mood normal.        Behavior:  Behavior normal.    UC Treatments / Results  Labs (all labs ordered are listed, but only abnormal results are displayed) Labs Reviewed  COMPREHENSIVE METABOLIC PANEL - Abnormal; Notable for the following components:      Result Value   Glucose, Bld 105 (*)    Calcium 8.8 (*)    AST 45 (*)    Alkaline Phosphatase 163 (*)    All other components within normal limits  CBC WITH DIFFERENTIAL/PLATELET    EKG   Radiology DG Chest 2 View  Result Date: 12/03/2019 CLINICAL DATA:  59 year old female with cough and shortness of breath, positive for COVID-19 11 days ago. Former smoker. EXAM: CHEST - 2 VIEW COMPARISON:  Chest radiographs 02/26/2018. FINDINGS: Bilateral patchy and indistinct peripheral and basilar pulmonary opacity, new since 2019. No superimposed pneumothorax, pulmonary edema or pleural effusion. There are underlying mild chronic generalized increased interstitial markings. Mediastinal contours remain normal. Visualized tracheal air column is within normal limits. No acute osseous abnormality identified. Negative visible bowel gas pattern. IMPRESSION: Patchy and confluent bilateral peripheral pulmonary opacity typical in appearance for COVID-19 Pneumonia. No pleural effusion. Electronically Signed   By: Genevie Ann M.D.   On: 12/03/2019 16:18    Procedures Procedures (including critical care time)  Medications Ordered in UC Medications  sodium chloride 0.9 % bolus 1,000 mL (1,000 mLs Intravenous New Bag/Given 12/03/19 1638)    Initial Impression / Assessment and Plan / UC Course  I have reviewed the triage vital signs and the nursing notes.  Pertinent labs & imaging results that were available during my care of the patient  were reviewed by me and considered in my medical decision making (see chart for details).    59 year old female presents with worsening symptoms in the setting of COVID-19.  X-ray revealed COVID-19 pneumonia.  Patient hypoxic here.  Requiring O2.  Patient was given IV fluids and laboratory studies obtained.  Patient needs hospital admission.  She is being sent to the hospital via EMS for further evaluation and management.  Final Clinical Impressions(s) / UC Diagnoses   Final diagnoses:  Pneumonia due to COVID-19 virus  Acute respiratory failure with hypoxia [facility])   Discharge Instructions   None    ED Prescriptions    None     PDMP not reviewed this encounter.   Coral Spikes, [place] 12/03/19 1825

## 2019-12-03 NOTE — ED Triage Notes (Signed)
Pt to triage via w/c with no distress noted; pt reports COVID + on 8/13; c/o SHOB, nonprod cough' receoved IVFs at [facility] and had CXR which indicated COVID pneumonia and was sent over for evaluation

## 2019-12-03 NOTE — ED Provider Notes (Signed)
[facility] Emergency Department Provider Note   ____________________________________________   First MD Initiated Contact with Patient 12/03/19 2258     (approximate)  I have reviewed the triage vital signs and the nursing notes.   HISTORY  Chief Complaint Shortness of Breath    HPI Ruth Morales is a 59 y.o. female with past medical history of Crohn disease, anxiety, and hyperlipidemia presents to the ED complaining of shortness of breath.  Patient reports that she started feeling badly about a week and a half ago with chills, cough, and shortness of breath.  She subsequently tested positive for COVID-19 on 8/13.  She initially started feeling better but has started feeling worse over the past couple of days with increasing difficulty breathing.  She was evaluated at urgent care earlier tonight, where x-ray was concerning for viral pneumonia and patient was placed on 2 L nasal cannula.  She was subsequently transferred to the ED for further evaluation.  Patient states that her breathing feels fine if she is at rest but seems to feel worse when she gets up and moves around.  She has not had any recent fevers, denies any chest pain or vomiting.  She has not yet been vaccinated for COVID-19.        Past Medical History:  Diagnosis Date  . Anxiety 11/21/2013  . Breath shortness 11/21/2013  . Crohn disease ([place])   . Dizziness 11/21/2013  . Extensor tenosynovitis of wrist 12/24/2013  . Ganglion cyst of wrist 12/24/2013  . Hypotension, postural 11/22/2013  . Psoriasis   . Skin cancer     Patient Active Problem List   Diagnosis Date Noted  . Other fatigue 03/24/2019  . Screening for breast cancer 12/25/2017  . History of ulcerative colitis 12/25/2017  . Screening for malignant neoplasm of cervix 12/25/2017  . Ovarian failure 12/25/2017  . Psoriasis 12/25/2017  . Pelvic pain 08/21/2017  . Dysuria 08/02/2017  . Urinary tract infection with hematuria 08/02/2017   . Menopausal and perimenopausal disorder 05/26/2017  . BMI 29.0-29.9,adult 04/14/2017  . Candidiasis of vulva and vagina 04/14/2017  . Mixed hyperlipidemia 04/14/2017  . Syncope and collapse 04/14/2017  . Low back pain 04/14/2017  . Vitamin D deficiency 04/14/2017  . Vitamin B12 deficiency anemia, unspecified 04/14/2017  . B12 deficiency 04/14/2017  . Vascular headache, not elsewhere classified 04/14/2017  . Extensor tenosynovitis of wrist 12/24/2013  . Ganglion cyst of wrist 12/24/2013  . Hypotension, postural 11/22/2013  . Anxiety 11/21/2013  . Dizziness 11/21/2013  . Breath shortness 11/21/2013    Past Surgical History:  Procedure Laterality Date  . BREAST BIOPSY Left 2003   needle bx-neg  . BREAST SURGERY Bilateral 2004   breast reduction  . CATARACT EXTRACTION W/PHACO Left 02/06/2018   Procedure: CATARACT EXTRACTION PHACO AND INTRAOCULAR LENS PLACEMENT (IOC);  Surgeon: Birder Robson, MD;  Location: [facility] ORS;  Service: Ophthalmology;  Laterality: Left;  [place] 00:31.5 CDE 3.49 Fluid pack Lot # [id number]  . COLON SURGERY  2004   colon removed  . CTR    . EXTRACORPOREAL SHOCK WAVE LITHOTRIPSY Right 02/26/2015   Procedure: EXTRACORPOREAL SHOCK WAVE LITHOTRIPSY (ESWL);  Surgeon: Collier Flowers, MD;  Location: [facility] ORS;  Service: Urology;  Laterality: Right;  . REDUCTION MAMMAPLASTY Bilateral 2005    Prior to Admission medications   Medication Sig Start Date End Date Taking? Authorizing Provider  Calcium Citrate-Vitamin D (CALCIUM CITRATE + D3 MAXIMUM) 315-250 MG-UNIT TABS Take by mouth.  [provider]  estradiol (CLIMARA - DOSED IN MG/24 HR) 0.025 mg/24hr patch Place 1 patch (0.025 mg total) onto the skin once a week. 04/21/19   Ronnell Freshwater, NP  ibuprofen (ADVIL,MOTRIN) 200 MG tablet Take 600 mg by mouth every 6 (six) hours as needed for headache or moderate pain.     [provider]  loperamide (IMODIUM) 2 MG capsule Take 4 mg by mouth 2 (two)  times daily.     [provider]  progesterone (PROMETRIUM) 100 MG capsule Take 1 capsule (100 mg total) by mouth daily. 04/21/19   Ronnell Freshwater, NP  promethazine (PHENERGAN) 12.5 MG tablet Take 1 tablet (12.5 mg total) by mouth every 8 (eight) hours as needed for nausea or vomiting. 11/28/19   Lavera Guise, MD  Vitamin D, Ergocalciferol, (DRISDOL) 1.25 MG (50000 UT) CAPS capsule Take 1 capsule (50,000 Units total) by mouth every 7 (seven) days. 04/18/19   Ronnell Freshwater, NP    Allergies Morphine, Penicillins, Macrobid [nitrofurantoin macrocrystal], and Benadryl [diphenhydramine hcl]  Family History  Problem Relation Age of Onset  . Diabetes Father   . Diabetes Mother   . Heart disease Maternal Grandmother   . Breast cancer Neg Hx     Social History Social History   Tobacco Use  . Smoking status: Former Research scientist (life sciences)  . Smokeless tobacco: Never Used  Vaping Use  . Vaping Use: Never used  Substance Use Topics  . Alcohol use: Yes    Comment: occasional drink, once a month  . Drug use: No    Review of Systems  Constitutional: No fever/chills Eyes: No visual changes. ENT: No sore throat. Cardiovascular: Denies chest pain. Respiratory: Positive for cough and shortness of breath. Gastrointestinal: No abdominal pain.  No nausea, no vomiting.  No diarrhea.  No constipation. Genitourinary: Negative for dysuria. Musculoskeletal: Negative for back pain. Skin: Negative for rash. Neurological: Negative for headaches, focal weakness or numbness.  ____________________________________________   PHYSICAL EXAM:  VITAL SIGNS: ED Triage Vitals [12/03/19 1907]  Enc Vitals Group     BP 133/78     Pulse Rate 79     Resp 18     Temp 98.4 F (36.9 C)     Temp Source Oral     SpO2 97 %     Weight 182 lb (82.6 kg)     Height 5\' 9"  (1.753 m)     Head Circumference      Peak Flow      Pain Score 0     Pain Loc      Pain Edu?      Excl. in [place]?     Constitutional: Alert  and oriented. Eyes: Conjunctivae are normal. Head: Atraumatic. Nose: No congestion/rhinnorhea. Mouth/Throat: Mucous membranes are moist. Neck: Normal ROM Cardiovascular: Normal rate, regular rhythm. Grossly normal heart sounds. Respiratory: Normal respiratory effort.  No retractions. Lungs CTAB. Gastrointestinal: Soft and nontender. No distention. Genitourinary: deferred Musculoskeletal: No lower extremity tenderness nor edema. Neurologic:  Normal speech and language. No gross focal neurologic deficits are appreciated. Skin:  Skin is warm, dry and intact. No rash noted. Psychiatric: Mood and affect are normal. Speech and behavior are normal.  ____________________________________________   LABS (all labs ordered are listed, but only abnormal results are displayed)  Labs Reviewed  BASIC METABOLIC PANEL - Abnormal; Notable for the following components:      Result Value   Glucose, Bld 110 (*)    All other components within normal  limits  SARS CORONAVIRUS 2 BY RT PCR (HOSPITAL ORDER, [place] [facility])  BRAIN NATRIURETIC PEPTIDE  CBC WITH DIFFERENTIAL/PLATELET  FIBRIN DERIVATIVES D-DIMER ([facility] ONLY)  C-REACTIVE PROTEIN  FERRITIN  TROPONIN I (HIGH SENSITIVITY)  TROPONIN I (HIGH SENSITIVITY)   ____________________________________________  EKG  ED ECG REPORT I, Blake Divine, the attending physician, personally viewed and interpreted this ECG.   Date: 12/03/2019  EKG Time: 19:08  Rate: 76  Rhythm: normal sinus rhythm  Axis: Normal  Intervals:none  ST&T Change: None   PROCEDURES  Procedure(s) performed (including Critical Care):  Procedures   ____________________________________________   INITIAL IMPRESSION / ASSESSMENT AND PLAN / ED COURSE       59 year old female with past medical history of Crohn's disease, anxiety, and hyperlipidemia presents to the ED complaining of increasing cough and shortness of breath over the past couple of days  after testing positive for COVID-19 11 days ago.  She is not in any respiratory distress on my evaluation, maintaining O2 sats on room air while at rest.  EKG is unremarkable, chest x-ray from urgent care reviewed and appears consistent with COVID-19 pneumonia.  We will check labs here in the ED and have patient ambulate on a pulse ox.  If labs and O2 sats remain reassuring, patient may be discharged home with follow-up at the infusion center.  Unfortunately, patient dropped her O2 sats to 88% on room air while ambulating.  We will give a dose of Decadron and case was discussed with hospitalist for admission.      ____________________________________________   FINAL CLINICAL IMPRESSION(S) / ED DIAGNOSES  Final diagnoses:  Pneumonia due to COVID-19 virus  Shortness of breath     ED Discharge Orders    None       Note:  This document was prepared using Dragon voice recognition software and may include unintentional dictation errors.   Blake Divine, MD 12/04/19 [phone]

## 2019-12-03 NOTE — ED Notes (Signed)
Reviewed pt's cc and results with Dr Archie Balboa; no further orders to be done at this time

## 2019-12-03 NOTE — ED Triage Notes (Signed)
Patient in today w/ c/o cough, SOB, weakness, fatigue, and possibly dehydration. Patient's husband states she does not have a colon.   Patient tested positive for COVID on 11/22/2019. Patient is not vaccinated.

## 2019-12-03 NOTE — ED Notes (Signed)
Called lab to see if BNP could be added on. Lab states it can.

## 2019-12-03 NOTE — ED Notes (Signed)
Patient is being discharged from the Urgent Care and sent to the Emergency Department via EMS . Per Dr. Lacinda Axon, patient is in need of higher level of care due to hypoxia, COVID positive, COVID pneumonia. Patient is aware and verbalizes understanding of plan of care.  Vitals:   12/03/19 1534 12/03/19 1802  BP: (!) 161/98 (!) 160/90  Pulse: 82 (!) 112  Resp: 20   Temp: 98.6 F (37 C)   SpO2: 95% 92%

## 2019-12-04 ENCOUNTER — Encounter: Payer: Self-pay | Admitting: Internal Medicine

## 2019-12-04 DIAGNOSIS — F419 Anxiety disorder, unspecified: Secondary | ICD-10-CM | POA: Diagnosis present

## 2019-12-04 DIAGNOSIS — J1282 Pneumonia due to coronavirus disease 2019: Secondary | ICD-10-CM | POA: Diagnosis present

## 2019-12-04 DIAGNOSIS — Z885 Allergy status to narcotic agent status: Secondary | ICD-10-CM | POA: Diagnosis not present

## 2019-12-04 DIAGNOSIS — Z87891 Personal history of nicotine dependence: Secondary | ICD-10-CM | POA: Diagnosis not present

## 2019-12-04 DIAGNOSIS — E559 Vitamin D deficiency, unspecified: Secondary | ICD-10-CM | POA: Diagnosis present

## 2019-12-04 DIAGNOSIS — R0602 Shortness of breath: Secondary | ICD-10-CM | POA: Diagnosis present

## 2019-12-04 DIAGNOSIS — G43909 Migraine, unspecified, not intractable, without status migrainosus: Secondary | ICD-10-CM | POA: Diagnosis present

## 2019-12-04 DIAGNOSIS — K5090 Crohn's disease, unspecified, without complications: Secondary | ICD-10-CM | POA: Diagnosis present

## 2019-12-04 DIAGNOSIS — E538 Deficiency of other specified B group vitamins: Secondary | ICD-10-CM | POA: Diagnosis present

## 2019-12-04 DIAGNOSIS — U071 COVID-19: Secondary | ICD-10-CM

## 2019-12-04 DIAGNOSIS — Z9049 Acquired absence of other specified parts of digestive tract: Secondary | ICD-10-CM | POA: Diagnosis not present

## 2019-12-04 DIAGNOSIS — R0902 Hypoxemia: Secondary | ICD-10-CM | POA: Diagnosis present

## 2019-12-04 DIAGNOSIS — Z8249 Family history of ischemic heart disease and other diseases of the circulatory system: Secondary | ICD-10-CM | POA: Diagnosis not present

## 2019-12-04 DIAGNOSIS — Z880 Allergy status to penicillin: Secondary | ICD-10-CM | POA: Diagnosis not present

## 2019-12-04 DIAGNOSIS — Z833 Family history of diabetes mellitus: Secondary | ICD-10-CM | POA: Diagnosis not present

## 2019-12-04 DIAGNOSIS — Z888 Allergy status to other drugs, medicaments and biological substances status: Secondary | ICD-10-CM | POA: Diagnosis not present

## 2019-12-04 DIAGNOSIS — Z79899 Other long term (current) drug therapy: Secondary | ICD-10-CM | POA: Diagnosis not present

## 2019-12-04 DIAGNOSIS — J9600 Acute respiratory failure, unspecified whether with hypoxia or hypercapnia: Secondary | ICD-10-CM

## 2019-12-04 DIAGNOSIS — E782 Mixed hyperlipidemia: Secondary | ICD-10-CM | POA: Diagnosis present

## 2019-12-04 LAB — BASIC METABOLIC PANEL
Anion gap: 11 (ref 5–15)
BUN: 9 mg/dL (ref 6–20)
CO2: 28 mmol/L (ref 22–32)
Calcium: 8.9 mg/dL (ref 8.9–10.3)
Chloride: 103 mmol/L (ref 98–111)
Creatinine, Ser: 0.51 mg/dL (ref 0.44–1.00)
GFR calc Af Amer: >60 mL/min (ref >60)
GFR calc non Af Amer: >60 mL/min (ref >60)
Glucose, Bld: 110 mg/dL — ABNORMAL HIGH (ref 70–99)
Potassium: 3.5 mmol/L (ref 3.5–5.1)
Sodium: 142 mmol/L (ref 135–145)

## 2019-12-04 LAB — ABO/RH: ABO/RH(D): A POS

## 2019-12-04 LAB — CBC WITH DIFFERENTIAL/PLATELET
Abs Immature Granulocytes: 0.04 10*3/uL (ref 0.00–0.07)
Basophils Absolute: 0 10*3/uL (ref 0.0–0.1)
Basophils Relative: 0 %
Eosinophils Absolute: 0 10*3/uL (ref 0.0–0.5)
Eosinophils Relative: 1 %
HCT: 37.1 % (ref 36.0–46.0)
Hemoglobin: 12.4 g/dL (ref 12.0–15.0)
Immature Granulocytes: 1 %
Lymphocytes Relative: 17 %
Lymphs Abs: 0.7 10*3/uL (ref 0.7–4.0)
MCH: 31.1 pg (ref 26.0–34.0)
MCHC: 33.4 g/dL (ref 30.0–36.0)
MCV: 93 fL (ref 80.0–100.0)
Monocytes Absolute: 0.4 10*3/uL (ref 0.1–1.0)
Monocytes Relative: 9 %
Neutro Abs: 3.1 10*3/uL (ref 1.7–7.7)
Neutrophils Relative %: 72 %
Platelets: 220 10*3/uL (ref 150–400)
RBC: 3.99 MIL/uL (ref 3.87–5.11)
RDW: 12.5 % (ref 11.5–15.5)
WBC: 4.3 10*3/uL (ref 4.0–10.5)
nRBC: 0 % (ref 0.0–0.2)

## 2019-12-04 LAB — TROPONIN I (HIGH SENSITIVITY)
Troponin I (High Sensitivity): 5 ng/L (ref <18)
Troponin I (High Sensitivity): 6 ng/L (ref <18)

## 2019-12-04 LAB — FIBRIN DERIVATIVES D-DIMER (ARMC ONLY): Fibrin derivatives D-dimer (ARMC): 1550.46 ng/mL (FEU) — ABNORMAL HIGH (ref 0.00–499.00)

## 2019-12-04 LAB — SARS CORONAVIRUS 2 BY RT PCR (HOSPITAL ORDER, PERFORMED IN ~~LOC~~ HOSPITAL LAB): SARS Coronavirus 2: POSITIVE — AB

## 2019-12-04 LAB — FERRITIN: Ferritin: 563 ng/mL — ABNORMAL HIGH (ref 11–307)

## 2019-12-04 LAB — HIV ANTIBODY (ROUTINE TESTING W REFLEX): HIV Screen 4th Generation wRfx: NONREACTIVE

## 2019-12-04 LAB — C-REACTIVE PROTEIN: CRP: 6.3 mg/dL — ABNORMAL HIGH (ref <1.0)

## 2019-12-04 LAB — BRAIN NATRIURETIC PEPTIDE: B Natriuretic Peptide: 41.1 pg/mL (ref 0.0–100.0)

## 2019-12-04 MED ORDER — ONDANSETRON HCL 4 MG/2ML IJ SOLN: 4.0000 mg | Freq: Four times a day (QID) | INTRAMUSCULAR | Status: DC | PRN

## 2019-12-04 MED ORDER — ENSURE ENLIVE PO LIQD
237.0000 mL | Freq: Three times a day (TID) | ORAL | Status: DC
  Administered 2019-12-04 (×2): 237 mL via ORAL

## 2019-12-04 MED ORDER — DEXAMETHASONE SODIUM PHOSPHATE 10 MG/ML IJ SOLN
10.0000 mg | Freq: Once | INTRAMUSCULAR | Status: AC
  Administered 2019-12-04: 10 mg via INTRAVENOUS
  Filled 2019-12-04: qty 1

## 2019-12-04 MED ORDER — ALBUTEROL SULFATE HFA 108 (90 BASE) MCG/ACT IN AERS
2.0000 | INHALATION_SPRAY | Freq: Four times a day (QID) | RESPIRATORY_TRACT | Status: DC
  Administered 2019-12-04 – 2019-12-06 (×10): 2 via RESPIRATORY_TRACT
  Filled 2019-12-04: qty 6.7

## 2019-12-04 MED ORDER — ADULT MULTIVITAMIN W/MINERALS CH
1.0000 | ORAL_TABLET | Freq: Every day | ORAL | Status: DC
  Administered 2019-12-04 – 2019-12-06 (×3): 1 via ORAL
  Filled 2019-12-04 (×3): qty 1

## 2019-12-04 MED ORDER — SODIUM CHLORIDE 0.9 % IV SOLN: 100.0000 mg | Freq: Every day | INTRAVENOUS | Status: DC

## 2019-12-04 MED ORDER — ZINC SULFATE 220 (50 ZN) MG PO CAPS
220.0000 mg | ORAL_CAPSULE | Freq: Every day | ORAL | Status: DC
  Administered 2019-12-04 – 2019-12-06 (×3): 220 mg via ORAL
  Filled 2019-12-04 (×3): qty 1

## 2019-12-04 MED ORDER — ASCORBIC ACID 500 MG PO TABS
500.0000 mg | ORAL_TABLET | Freq: Every day | ORAL | Status: DC
  Administered 2019-12-04 – 2019-12-06 (×3): 500 mg via ORAL
  Filled 2019-12-04 (×3): qty 1

## 2019-12-04 MED ORDER — ONDANSETRON HCL 4 MG PO TABS: 4.0000 mg | ORAL_TABLET | Freq: Four times a day (QID) | ORAL | Status: DC | PRN

## 2019-12-04 MED ORDER — DEXAMETHASONE SODIUM PHOSPHATE 10 MG/ML IJ SOLN
6.0000 mg | INTRAMUSCULAR | Status: DC
  Administered 2019-12-04 – 2019-12-05 (×2): 6 mg via INTRAVENOUS
  Filled 2019-12-04 (×2): qty 1

## 2019-12-04 MED ORDER — ENOXAPARIN SODIUM 40 MG/0.4ML ~~LOC~~ SOLN
40.0000 mg | SUBCUTANEOUS | Status: DC
  Administered 2019-12-04 – 2019-12-06 (×3): 40 mg via SUBCUTANEOUS
  Filled 2019-12-04 (×3): qty 0.4

## 2019-12-04 MED ORDER — LOPERAMIDE HCL 2 MG PO CAPS
4.0000 mg | ORAL_CAPSULE | Freq: Two times a day (BID) | ORAL | Status: DC | PRN
  Administered 2019-12-04: 11:00:00 4 mg via ORAL
  Filled 2019-12-04: qty 2

## 2019-12-04 MED ORDER — ACETAMINOPHEN 325 MG PO TABS: 650.0000 mg | ORAL_TABLET | Freq: Four times a day (QID) | ORAL | Status: DC | PRN

## 2019-12-04 MED ORDER — SODIUM CHLORIDE 0.9 % IV SOLN
100.0000 mg | Freq: Every day | INTRAVENOUS | Status: DC
  Administered 2019-12-05 – 2019-12-06 (×2): 100 mg via INTRAVENOUS
  Filled 2019-12-04 (×2): qty 20

## 2019-12-04 MED ORDER — SODIUM CHLORIDE 0.9 % IV SOLN
200.0000 mg | Freq: Once | INTRAVENOUS | Status: AC
  Administered 2019-12-04: 200 mg via INTRAVENOUS
  Filled 2019-12-04: qty 200

## 2019-12-04 MED ORDER — HYDROCOD POLST-CPM POLST ER 10-8 MG/5ML PO SUER: 5.0000 mL | Freq: Two times a day (BID) | ORAL | Status: DC | PRN

## 2019-12-04 MED ORDER — GUAIFENESIN-DM 100-10 MG/5ML PO SYRP
10.0000 mL | ORAL_SOLUTION | ORAL | Status: DC | PRN
  Administered 2019-12-04 – 2019-12-05 (×2): 10 mL via ORAL
  Filled 2019-12-04 (×3): qty 10

## 2019-12-04 MED ORDER — SODIUM CHLORIDE 0.9 % IV SOLN: 200.0000 mg | Freq: Once | INTRAVENOUS | Status: DC

## 2019-12-04 NOTE — H&P (Addendum)
History and Physical    MELL MELLOTT ZTI:[id number] DOB: Mar 29, 1961 DOA: 12/03/2019  PCP: Lavera Guise, MD   Patient coming from: Home  I have personally briefly reviewed patient's old medical records in [place]  Chief Complaint: Shortness of breath  HPI: BATINA DOUGAN is a 59 y.o. female with medical history significant for ulcerative colitis status post colectomy with ileal pouch in 2004 with intermittent pouchitis treated with Cipro, not currently on immunosuppressive medication who was diagnosed with Covid on 8/13 who presents with worsening symptoms, more recently worsening shortness of breath prompting her to go to urgent care with chest x-ray revealed pneumonia prompting referral to the emergency room for further work-up.patient is unvaccinated. ED Course: On arrival O2 sat was 97% on room air while at rest but she desaturated to 88% on room air with exertion and ambulation.  Vitals were otherwise within normal limits.  She was started on usual Covid treatment, O2.  Hospitalist consulted for admission.  Review of Systems: As per HPI otherwise all other systems on review of systems negative.    Past Medical History:  Diagnosis Date  . Anxiety 11/21/2013  . Breath shortness 11/21/2013  . Crohn disease ([place])   . Dizziness 11/21/2013  . Extensor tenosynovitis of wrist 12/24/2013  . Ganglion cyst of wrist 12/24/2013  . Hypotension, postural 11/22/2013  . Psoriasis   . Skin cancer     Past Surgical History:  Procedure Laterality Date  . BREAST BIOPSY Left 2003   needle bx-neg  . BREAST SURGERY Bilateral 2004   breast reduction  . CATARACT EXTRACTION W/PHACO Left 02/06/2018   Procedure: CATARACT EXTRACTION PHACO AND INTRAOCULAR LENS PLACEMENT (IOC);  Surgeon: Birder Robson, MD;  Location: [facility] ORS;  Service: Ophthalmology;  Laterality: Left;  [place] 00:31.5 CDE 3.49 Fluid pack Lot # [id number]  . COLON SURGERY  2004   colon removed  . CTR    . EXTRACORPOREAL SHOCK WAVE  LITHOTRIPSY Right 02/26/2015   Procedure: EXTRACORPOREAL SHOCK WAVE LITHOTRIPSY (ESWL);  Surgeon: Collier Flowers, MD;  Location: [facility] ORS;  Service: Urology;  Laterality: Right;  . REDUCTION MAMMAPLASTY Bilateral 2005     reports that she has quit smoking. She has never used smokeless tobacco. She reports current alcohol use. She reports that she does not use drugs.  Allergies  Allergen Reactions  . Morphine Other (See Comments)    Hallucination.  Marland Kitchen Penicillins Anaphylaxis and Other (See Comments)    Headaches per patient Has patient had a PCN reaction causing immediate rash, facial/tongue/throat swelling, SOB or lightheadedness with hypotension: Unknown Has patient had a PCN reaction causing severe rash involving mucus membranes or skin necrosis: No Has patient had a PCN reaction that required hospitalization: No Has patient had a PCN reaction occurring within the last 10 years: No If all of the above answers are "NO", then may proceed with Cephalosporin use.   Santiago Bur [Nitrofurantoin Macrocrystal] Other (See Comments)    Unknown  . Benadryl [Diphenhydramine Hcl] Anxiety and Other (See Comments)    Per patient "craziness"    Family History  Problem Relation Age of Onset  . Diabetes Father   . Diabetes Mother   . Heart disease Maternal Grandmother   . Breast cancer Neg Hx       Prior to Admission medications   Medication Sig Start Date End Date Taking? Authorizing Provider  Calcium Citrate-Vitamin D (CALCIUM CITRATE + D3 MAXIMUM) 315-250 MG-UNIT TABS Take by mouth.    [provider]  estradiol (CLIMARA - DOSED IN MG/24 HR) 0.025 mg/24hr patch Place 1 patch (0.025 mg total) onto the skin once a week. 04/21/19   Ronnell Freshwater, NP  ibuprofen (ADVIL,MOTRIN) 200 MG tablet Take 600 mg by mouth every 6 (six) hours as needed for headache or moderate pain.     [provider]  loperamide (IMODIUM) 2 MG capsule Take 4 mg by mouth 2 (two) times daily.      [provider]  progesterone (PROMETRIUM) 100 MG capsule Take 1 capsule (100 mg total) by mouth daily. 04/21/19   Ronnell Freshwater, NP  promethazine (PHENERGAN) 12.5 MG tablet Take 1 tablet (12.5 mg total) by mouth every 8 (eight) hours as needed for nausea or vomiting. 11/28/19   Lavera Guise, MD  Vitamin D, Ergocalciferol, (DRISDOL) 1.25 MG (50000 UT) CAPS capsule Take 1 capsule (50,000 Units total) by mouth every 7 (seven) days. 04/18/19   Ronnell Freshwater, NP    Physical Exam: Vitals:   12/03/19 2300 12/04/19 0000 12/04/19 0030 12/04/19 0045  BP: 138/79 (!) 141/78 (!) 153/86   Pulse: 78 76 79 82  Resp:      Temp:      TempSrc:      SpO2: 95% 100% 95% 95%  Weight:      Height:         Vitals:   12/03/19 2300 12/04/19 0000 12/04/19 0030 12/04/19 0045  BP: 138/79 (!) 141/78 (!) 153/86   Pulse: 78 76 79 82  Resp:      Temp:      TempSrc:      SpO2: 95% 100% 95% 95%  Weight:      Height:          Constitutional: Alert and oriented x 3 .   HEENT: Grossly normal Limited in the setting of Covid positivity Cardiovascular: Regular rate and rhythm. No murmurs, gallops, or rubs. 2+ symmetrical distal pulses are present . No JVD. No LE edema Respiratory:  Mild tachypnea.coarse breath sounds Gastrointestinal: Soft, non tender, and non distended with positive bowel sounds. No rebound or guarding. Genitourinary: No CVA tenderness. Musculoskeletal: Nontender with normal range of motion in all extremities. No cyanosis, or erythema of extremities. Neurologic: Normal speech and language. Face is symmetric. Moving all extremities. No gross focal neurologic deficits . Skin: Skin is warm, dry.  Psychiatric: Normal mood and behavior  Labs on Admission: I have personally reviewed following labs and imaging studies  CBC: Recent Labs  Lab 12/03/19 1611 12/04/19 0018  WBC 4.2 4.3  NEUTROABS 3.2 3.1  HGB 12.8 12.4  HCT 38.2 37.1  MCV 91.4 93.0  PLT 221 026   Basic Metabolic  Panel: Recent Labs  Lab 12/03/19 1611 12/04/19 0018  NA 138 142  K 3.6 3.5  CL 100 103  CO2 27 28  GLUCOSE 105* 110*  BUN 10 9  CREATININE 0.75 0.51  CALCIUM 8.8* 8.9   GFR: Estimated Creatinine Clearance: 85.9 mL/min (by C-G formula based on SCr of 0.51 mg/dL). Liver Function Tests: Recent Labs  Lab 12/03/19 1611  AST 45*  ALT 36  ALKPHOS 163*  BILITOT 0.9  PROT 7.6  ALBUMIN 3.8   No results for input(s): LIPASE, AMYLASE in the last 168 hours. No results for input(s): AMMONIA in the last 168 hours. Coagulation Profile: No results for input(s): INR, PROTIME in the last 168 hours. Cardiac Enzymes: No results for input(s): CKTOTAL, CKMB, CKMBINDEX, TROPONINI in the last 168  hours. BNP (last 3 results) No results for input(s): PROBNP in the last 8760 hours. HbA1C: No results for input(s): HGBA1C in the last 72 hours. CBG: No results for input(s): GLUCAP in the last 168 hours. Lipid Profile: No results for input(s): CHOL, HDL, LDLCALC, TRIG, CHOLHDL, LDLDIRECT in the last 72 hours. Thyroid Function Tests: No results for input(s): TSH, T4TOTAL, FREET4, T3FREE, THYROIDAB in the last 72 hours. Anemia Panel: No results for input(s): VITAMINB12, FOLATE, FERRITIN, TIBC, IRON, RETICCTPCT in the last 72 hours. Urine analysis:    Component Value Date/Time   COLORURINE STRAW (A) 01/15/2015 0830   APPEARANCEUR Clear 12/22/2017 0859   LABSPEC 1.020 01/15/2015 0830   LABSPEC 1.040 03/12/2014 0303   PHURINE 5.5 01/15/2015 0830   GLUCOSEU Negative 12/22/2017 0859   GLUCOSEU Negative 03/12/2014 0303   HGBUR NEGATIVE 01/15/2015 0830   BILIRUBINUR Negative 12/22/2017 0859   BILIRUBINUR Negative 03/12/2014 0303   KETONESUR NEGATIVE 01/15/2015 0830   PROTEINUR Negative 12/22/2017 0859   PROTEINUR NEGATIVE 01/15/2015 0830   UROBILINOGEN 0.2 07/10/2017 1609   NITRITE Negative 12/22/2017 0859   NITRITE NEGATIVE 01/15/2015 0830   LEUKOCYTESUR 2+ (A) 12/22/2017 0859   LEUKOCYTESUR  Negative 03/12/2014 0303    Radiological Exams on Admission: DG Chest 2 View  Result Date: 12/03/2019 CLINICAL DATA:  59 year old female with cough and shortness of breath, positive for COVID-19 11 days ago. Former smoker. EXAM: CHEST - 2 VIEW COMPARISON:  Chest radiographs 02/26/2018. FINDINGS: Bilateral patchy and indistinct peripheral and basilar pulmonary opacity, new since 2019. No superimposed pneumothorax, pulmonary edema or pleural effusion. There are underlying mild chronic generalized increased interstitial markings. Mediastinal contours remain normal. Visualized tracheal air column is within normal limits. No acute osseous abnormality identified. Negative visible bowel gas pattern. IMPRESSION: Patchy and confluent bilateral peripheral pulmonary opacity typical in appearance for COVID-19 Pneumonia. No pleural effusion. Electronically Signed   By: Genevie Ann M.D.   On: 12/03/2019 16:18    EKG: Independently reviewed. Interpretation : Normal sinus rhythm with no acute ST-T wave changes  Assessment/Plan 59 year old female with history of ulcerative colitis status post colectomy with ileal pouch in 2004 with intermittent pouchitis treated with Cipro, not currently on immunosuppressive medication who was diagnosed with Covid on 8/13 presented with worsening shortness of breath .    Pneumonia due to COVID-19 virus, unvaccinated   Acute respiratory failure due to COVID-19 ([place]) -Remdesivir, dexamethasone, albuterol, antitussives and vitamins -Oxygen and proning as tolerated    History of ulcerative colitis s/p ileal pouch 2004 -Loperamide as needed    DVT prophylaxis: Lovenox  Code Status: full code  Family Communication:  none  Disposition Plan: Back to previous home environment Consults called: none  Status:At the time of admission, it appears that the appropriate admission status for this patient is INPATIENT. This is judged to be reasonable and necessary in order to provide the  required intensity of service to ensure the patient's safety given the presenting symptoms, physical exam findings, and initial radiographic and laboratory data in the context of their  Comorbid conditions.   Patient requires inpatient status due to high intensity of service, high risk for further deterioration and high frequency of surveillance required.   I certify that at the point of admission it is my clinical judgment that the patient will require inpatient hospital care spanning beyond [place] MD [facility]     12/04/2019, 1:24 AM

## 2019-12-04 NOTE — ED Notes (Signed)
ED TO INPATIENT HANDOFF REPORT  ED Nurse Name and Phone #: Valetta Fuller [phone]  S Name/Age/Gender Ruth Morales 59 y.o. female Room/Bed: ED11A/ED11A  Code Status   Code Status: Full Code  Home/SNF/Other Home Patient oriented to: self, place, time and situation Is this baseline? Yes   Triage Complete: Triage complete  Chief Complaint Acute respiratory failure due to COVID-19 ([place]) [U07.1, J96.00]  Triage Note Pt to triage via w/c with no distress noted; pt reports COVID + on 8/13; c/o SHOB, nonprod cough' receoved IVFs at [facility] and had CXR which indicated COVID pneumonia and was sent over for evaluation    Allergies Allergies  Allergen Reactions  . Morphine Other (See Comments)    Hallucination.  Marland Kitchen Penicillins Anaphylaxis and Other (See Comments)    Headaches per patient Has patient had a PCN reaction causing immediate rash, facial/tongue/throat swelling, SOB or lightheadedness with hypotension: Unknown Has patient had a PCN reaction causing severe rash involving mucus membranes or skin necrosis: No Has patient had a PCN reaction that required hospitalization: No Has patient had a PCN reaction occurring within the last 10 years: No If all of the above answers are "NO", then may proceed with Cephalosporin use.   Santiago Bur [Nitrofurantoin Macrocrystal] Other (See Comments)    Unknown  . Benadryl [Diphenhydramine Hcl] Anxiety and Other (See Comments)    Per patient "craziness"    Level of Care/Admitting Diagnosis ED Disposition    ED Disposition Condition [place] [facility]: [place] [[id number]]  Level of Care: Med-Surg [16]  Covid Evaluation: Confirmed COVID Positive  Diagnosis: Acute respiratory failure due to COVID-19 [facility]) [[id number]]  Admitting Physician: Athena Masse [[id number]]  Attending Physician: Athena Masse [[id number]]  Estimated length of stay: past midnight tomorrow  Certification:: I certify this patient will need inpatient  services for at least 2 midnights       B Medical/Surgery History Past Medical History:  Diagnosis Date  . Anxiety 11/21/2013  . Breath shortness 11/21/2013  . Crohn disease ([place])   . Dizziness 11/21/2013  . Extensor tenosynovitis of wrist 12/24/2013  . Ganglion cyst of wrist 12/24/2013  . Hypotension, postural 11/22/2013  . Psoriasis   . Skin cancer    Past Surgical History:  Procedure Laterality Date  . BREAST BIOPSY Left 2003   needle bx-neg  . BREAST SURGERY Bilateral 2004   breast reduction  . CATARACT EXTRACTION W/PHACO Left 02/06/2018   Procedure: CATARACT EXTRACTION PHACO AND INTRAOCULAR LENS PLACEMENT (IOC);  Surgeon: Birder Robson, MD;  Location: [facility] ORS;  Service: Ophthalmology;  Laterality: Left;  [place] 00:31.5 CDE 3.49 Fluid pack Lot # [id number]  . COLON SURGERY  2004   colon removed  . CTR    . EXTRACORPOREAL SHOCK WAVE LITHOTRIPSY Right 02/26/2015   Procedure: EXTRACORPOREAL SHOCK WAVE LITHOTRIPSY (ESWL);  Surgeon: Collier Flowers, MD;  Location: [facility] ORS;  Service: Urology;  Laterality: Right;  . REDUCTION MAMMAPLASTY Bilateral 2005     A IV Location/Drains/Wounds Patient Lines/Drains/Airways Status    Active Line/Drains/Airways    Name Placement date Placement time Site Days   Peripheral IV 12/03/19 Right Antecubital 12/03/19  1630  Antecubital  1          Intake/Output Last 24 hours No intake or output data in the 24 hours ending 12/04/19 0205  Labs/Imaging Results for orders placed or performed during the hospital encounter of 12/03/19 (from the past 48 hour(s))  SARS Coronavirus 2 by RT  PCR (hospital order, performed in [facility] hospital lab) Nasopharyngeal Nasopharyngeal Swab     Status: Abnormal   Collection Time: 12/03/19 12:18 AM   Specimen: Nasopharyngeal Swab  Result Value Ref Range   SARS Coronavirus 2 POSITIVE (A) NEGATIVE    Comment:  soust @0127  on 12/04/19 skl (NOTE) SARS-CoV-2 target nucleic acids are DETECTED  SARS-CoV-2  RNA is generally detectable in upper respiratory specimens  during the acute phase of infection.  Positive results are indicative  of the presence of the identified virus, but do not rule out bacterial infection or co-infection with other pathogens not detected by the test.  Clinical correlation with patient history and  other diagnostic information is necessary to determine patient infection status.  The expected result is negative.  Fact Sheet for Patients:   StrictlyIdeas.no   Fact Sheet for Healthcare Providers:   BankingDealers.co.za    This test is not yet approved or cleared by the [place] FDA and  has been authorized for detection and/or diagnosis of SARS-CoV-2 by FDA under an Emergency Use Authorization (EUA).  This EUA will remain in effect (meaning this test can be used) for the duration of  the COVID-19 dec laration under Section 564(b)(1) of the Act, 21 U.S.C. section 360-bbb-3(b)(1), unless the authorization is terminated or revoked sooner.  Performed at [facility], [address] [place], [place] [address]   Brain natriuretic peptide     Status: None   Collection Time: 12/04/19 12:18 AM  Result Value Ref Range   B Natriuretic Peptide 41.1 0.0 - 100.0 pg/mL    Comment: Performed at [facility], [address] [place], [place] [address]  CBC with Differential     Status: None   Collection Time: 12/04/19 12:18 AM  Result Value Ref Range   WBC 4.3 4.0 - 10.5 K/uL   RBC 3.99 3.87 - 5.11 MIL/uL   Hemoglobin 12.4 12.0 - 15.0 g/dL   HCT 37.1 36 - 46 %   MCV 93.0 80.0 - 100.0 fL   MCH 31.1 26.0 - 34.0 pg   MCHC 33.4 30.0 - 36.0 g/dL   RDW 12.5 11.5 - 15.5 %   Platelets 220 150 - 400 K/uL   nRBC 0.0 0.0 - 0.2 %   Neutrophils Relative % 72 %   Neutro Abs 3.1 1.7 - 7.7 K/uL   Lymphocytes Relative 17 %   Lymphs Abs 0.7 0.7 - 4.0 K/uL   Monocytes Relative 9 %   Monocytes Absolute 0.4 0 - 1  K/uL   Eosinophils Relative 1 %   Eosinophils Absolute 0.0 0 - 0 K/uL   Basophils Relative 0 %   Basophils Absolute 0.0 0 - 0 K/uL   Immature Granulocytes 1 %   Abs Immature Granulocytes 0.04 0.00 - 0.07 K/uL    Comment: Performed at [facility], [address] [place], [place] [address]  Basic metabolic panel     Status: Abnormal   Collection Time: 12/04/19 12:18 AM  Result Value Ref Range   Sodium 142 135 - 145 mmol/L   Potassium 3.5 3.5 - 5.1 mmol/L   Chloride 103 98 - 111 mmol/L   CO2 28 22 - 32 mmol/L   Glucose, Bld 110 (H) 70 - 99 mg/dL    Comment: Glucose reference range applies only to samples taken after fasting for at least 8 hours.   BUN 9 6 - 20 mg/dL   Creatinine, Ser 0.51 0.44 - 1.00 mg/dL  Calcium 8.9 8.9 - 10.3 mg/dL   GFR calc non Af Amer >60 >60 mL/min   GFR calc Af Amer >60 >60 mL/min   Anion gap 11 5 - 15    Comment: Performed at [facility], [place], [place] [address]  Troponin I (High Sensitivity)     Status: None   Collection Time: 12/04/19 12:18 AM  Result Value Ref Range   Troponin I (High Sensitivity) 5 <18 ng/L    Comment: (NOTE) Elevated high sensitivity troponin I (hsTnI) values and significant  changes across serial measurements may suggest ACS but many other  chronic and acute conditions are known to elevate hsTnI results.  Refer to the "Links" section for chest pain algorithms and additional  guidance. Performed at [facility], [address] [place],  [address]    DG Chest 2 View  Result Date: 12/03/2019 CLINICAL DATA:  59 year old female with cough and shortness of breath, positive for COVID-19 11 days ago. Former smoker. EXAM: CHEST - 2 VIEW COMPARISON:  Chest radiographs 02/26/2018. FINDINGS: Bilateral patchy and indistinct peripheral and basilar pulmonary opacity, new since 2019. No superimposed pneumothorax, pulmonary edema or pleural effusion. There are underlying mild chronic  generalized increased interstitial markings. Mediastinal contours remain normal. Visualized tracheal air column is within normal limits. No acute osseous abnormality identified. Negative visible bowel gas pattern. IMPRESSION: Patchy and confluent bilateral peripheral pulmonary opacity typical in appearance for COVID-19 Pneumonia. No pleural effusion. Electronically Signed   By: Genevie Ann M.D.   On: 12/03/2019 16:18    Pending Labs Unresulted Labs (From admission, onward)          Start     Ordered   12/11/19 0500  Creatinine, serum  (enoxaparin (LOVENOX)    CrCl >/= 30 ml/min)  Weekly,   STAT     Comments: while on enoxaparin therapy    12/04/19 0123   12/05/19 0500  CBC with Differential/Platelet  Daily,   STAT      12/04/19 0123   12/05/19 0500  Comprehensive metabolic panel  Daily,   STAT      12/04/19 0123   12/05/19 0500  C-reactive protein  Daily,   STAT      12/04/19 0123   12/05/19 0500  Fibrin derivatives D-Dimer ([place] only)  Daily,   STAT      12/04/19 0123   12/05/19 0500  Ferritin  Daily,   STAT      12/04/19 0123   12/04/19 0122  HIV Antibody (routine testing w rflx)  (HIV Antibody (Routine testing w reflex) panel)  Once,   STAT        12/04/19 0123   12/04/19 0122  ABO/Rh  Once,   STAT        12/04/19 0123   12/04/19 0058  C-reactive protein  Once,   STAT        12/04/19 0057   12/04/19 0058  Ferritin (Iron Binding Protein)  ONCE - STAT,   STAT        12/04/19 0057   12/04/19 0057  Fibrin derivatives D-Dimer  Once,   STAT        12/04/19 0057          Vitals/Pain Today's Vitals   12/04/19 0000 12/04/19 0030 12/04/19 0045 12/04/19 0100  BP: (!) 141/78 (!) 153/86  (!) 143/82  Pulse: 76 79 82 85  Resp:      Temp:      TempSrc:  SpO2: 100% 95% 95% 94%  Weight:      Height:      PainSc:        Isolation Precautions Airborne and Contact precautions  Medications Medications  dexamethasone (DECADRON) injection 10 mg (has no administration in time range)   enoxaparin (LOVENOX) injection 40 mg (has no administration in time range)  albuterol (VENTOLIN HFA) 108 (90 Base) MCG/ACT inhaler 2 puff (has no administration in time range)  dexamethasone (DECADRON) injection 6 mg (has no administration in time range)  guaiFENesin-dextromethorphan (ROBITUSSIN DM) 100-10 MG/5ML syrup 10 mL (has no administration in time range)  chlorpheniramine-HYDROcodone (TUSSIONEX) 10-8 MG/5ML suspension 5 mL (has no administration in time range)  ascorbic acid (VITAMIN C) tablet 500 mg (has no administration in time range)  zinc sulfate capsule 220 mg (has no administration in time range)  multivitamin with minerals tablet 1 tablet (has no administration in time range)  ondansetron (ZOFRAN) tablet 4 mg (has no administration in time range)    Or  ondansetron (ZOFRAN) injection 4 mg (has no administration in time range)  acetaminophen (TYLENOL) tablet 650 mg (has no administration in time range)  remdesivir 200 mg in sodium chloride 0.9% 250 mL IVPB (has no administration in time range)    Followed by  remdesivir 100 mg in sodium chloride 0.9 % 100 mL IVPB (has no administration in time range)    Mobility walks Low fall risk   Focused Assessments Pulmonary Assessment Handoff:  Lung sounds:   O2 Device: Room Air        R Recommendations: See Admitting Provider Note  Report given to:   Additional Notes: NA

## 2019-12-04 NOTE — Progress Notes (Signed)
Initial Nutrition Assessment  DOCUMENTATION CODES:   Not applicable  INTERVENTION:   Ensure Enlive po TID, each supplement provides 350 kcal and 20 grams of protein  MVI daily   Pt at high refeed risk; recommend monitor K, Mg and P labs daily as oral intake improves.   NUTRITION DIAGNOSIS:   Increased nutrient needs related to catabolic illness (COVID 19) as evidenced by increased estimated needs.  GOAL:   Patient will meet greater than or equal to 90% of their needs  MONITOR:   PO intake, Supplement acceptance, Labs, Weight trends, Skin, I & O's  REASON FOR ASSESSMENT:   Malnutrition Screening Tool    ASSESSMENT:   59 y.o. female with medical history significant for ulcerative colitis status post colectomy with ileal pouch in 2004 with intermittent pouchitis treated with Cipro, not currently on immunosuppressive medication who is admitted with COVID 19 PNA   Spoke with pt via phone. Pt reports poor appetite and oral intake for the past ~11 days. Pt also reports that she has been losing weight. Pt reports that her appetite is improving today; pt reports eating 100% of her breakfast this morning. Per chart, pt is down 4lbs(2%) over the past week. RD discussed with pt the importance of adequate nutrition needed to preserve lean muscle. Pt with h/o UC and colectomy. Pt reports that she avoids most foods with fiber as they cause her diarrhea, specifically she mentions raw fruit and salads. Pt also reports that she does not like milk. Provided pt education that Ensure is lactose free and only contains the milk proteins. Pt is willing to try strawberry Ensure in hospital; pt can have Boost Breeze if she does not tolerate the Ensure well. Pt is at high refeed risk.    Medications reviewed and include: vitamin C, dexamethasone, lovenox, zinc   Labs reviewed:    NUTRITION - FOCUSED PHYSICAL EXAM: Unable to perform at this time as pt with COVID 19  Diet Order:   Diet Order             Diet regular Room service appropriate? Yes; Fluid consistency: Thin  Diet effective now                EDUCATION NEEDS:   Education needs have been addressed  Skin:  Skin Assessment: Reviewed RN Assessment  Last BM:  8/24  Height:   Ht Readings from Last 1 Encounters:  12/03/19 5\' 9"  (1.753 m)    Weight:   Wt Readings from Last 1 Encounters:  12/03/19 82.6 kg    Ideal Body Weight:  65.9 kg  BMI:  Body mass index is 26.88 kg/m.  Estimated Nutritional Needs:   Kcal:  2000-2300kcal/day  Protein:  100-115g/day  Fluid:  >2L/day  Koleen Distance MS, RD, LDN Please refer to [facility] for RD and/or RD on-call/weekend/after hours pager

## 2019-12-04 NOTE — ED Notes (Signed)
Ambulated around room, oxygen saturation 88% at lowest on room air.

## 2019-12-04 NOTE — Progress Notes (Signed)
Remdesivir - Pharmacy Brief Note   O:  CXR: IMPRESSION: Patchy and confluent bilateral peripheral pulmonary opacity typical in appearance for COVID-19 Pneumonia. No pleural effusion. SpO2: 92 - 96% on 2L [place]   A/P:  Remdesivir 200 mg IVPB once followed by 100 mg IVPB daily x 4 days.   Tobie Lords, PharmD, BCPS Clinical Pharmacist 12/04/2019 1:36 AM

## 2019-12-04 NOTE — Progress Notes (Signed)
Brief hospitalist update note.  This is a nonbillable note.  Please see same-day H&P for full billable details  Briefly, this is a 59 year old female with history significant for ulcerative colitis status post colectomy with ileal pouch in 2004.  She is not on any immunosuppressive medications.  Tested positive on 11/22/2019.  Presented to hospital yesterday with worsening symptoms including shortness of breath.  Chest x-ray does reveal pneumonia.  Saturations adequate on room air however desaturates very quickly with exacerbation.  Patient appeared fatigued on my evaluation today.  Mentating clearly.  Normal work of breathing.  Continue current scope of treatment with remdesivir, Decadron, albuterol MDI, antitussives, vitamins.  Encourage patient to prone.  Encourage patient to use incentive spirometry and flutter valve.  Ralene Muskrat MD

## 2019-12-04 NOTE — Plan of Care (Signed)
  Problem: Education: Goal: Knowledge of General Education information will improve Description: Including pain rating scale, medication(s)/side effects and non-pharmacologic comfort measures Outcome: Progressing   Problem: Clinical Measurements: Goal: Will remain free from infection Outcome: Progressing   Problem: Clinical Measurements: Goal: Respiratory complications will improve Outcome: Progressing   Problem: Nutrition: Goal: Adequate nutrition will be maintained Outcome: Progressing

## 2019-12-05 LAB — FERRITIN: Ferritin: 512 ng/mL — ABNORMAL HIGH (ref 11–307)

## 2019-12-05 LAB — CBC WITH DIFFERENTIAL/PLATELET
Abs Immature Granulocytes: 0.14 10*3/uL — ABNORMAL HIGH (ref 0.00–0.07)
Basophils Absolute: 0 10*3/uL (ref 0.0–0.1)
Basophils Relative: 0 %
Eosinophils Absolute: 0 10*3/uL (ref 0.0–0.5)
Eosinophils Relative: 0 %
HCT: 36.2 % (ref 36.0–46.0)
Hemoglobin: 12.2 g/dL (ref 12.0–15.0)
Immature Granulocytes: 2 %
Lymphocytes Relative: 8 %
Lymphs Abs: 0.7 10*3/uL (ref 0.7–4.0)
MCH: 31 pg (ref 26.0–34.0)
MCHC: 33.7 g/dL (ref 30.0–36.0)
MCV: 92.1 fL (ref 80.0–100.0)
Monocytes Absolute: 0.2 10*3/uL (ref 0.1–1.0)
Monocytes Relative: 2 %
Neutro Abs: 7.5 10*3/uL (ref 1.7–7.7)
Neutrophils Relative %: 88 %
Platelets: 285 10*3/uL (ref 150–400)
RBC: 3.93 MIL/uL (ref 3.87–5.11)
RDW: 12.2 % (ref 11.5–15.5)
WBC: 8.5 10*3/uL (ref 4.0–10.5)
nRBC: 0 % (ref 0.0–0.2)

## 2019-12-05 LAB — COMPREHENSIVE METABOLIC PANEL
ALT: 56 U/L — ABNORMAL HIGH (ref 0–44)
AST: 71 U/L — ABNORMAL HIGH (ref 15–41)
Albumin: 3.4 g/dL — ABNORMAL LOW (ref 3.5–5.0)
Alkaline Phosphatase: 181 U/L — ABNORMAL HIGH (ref 38–126)
Anion gap: 13 (ref 5–15)
BUN: 17 mg/dL (ref 6–20)
CO2: 24 mmol/L (ref 22–32)
Calcium: 9.1 mg/dL (ref 8.9–10.3)
Chloride: 103 mmol/L (ref 98–111)
Creatinine, Ser: 0.59 mg/dL (ref 0.44–1.00)
GFR calc Af Amer: >60 mL/min (ref >60)
GFR calc non Af Amer: >60 mL/min (ref >60)
Glucose, Bld: 161 mg/dL — ABNORMAL HIGH (ref 70–99)
Potassium: 3.6 mmol/L (ref 3.5–5.1)
Sodium: 140 mmol/L (ref 135–145)
Total Bilirubin: 0.9 mg/dL (ref 0.3–1.2)
Total Protein: 7.1 g/dL (ref 6.5–8.1)

## 2019-12-05 LAB — C-REACTIVE PROTEIN: CRP: 6 mg/dL — ABNORMAL HIGH (ref <1.0)

## 2019-12-05 LAB — FIBRIN DERIVATIVES D-DIMER (ARMC ONLY): Fibrin derivatives D-dimer (ARMC): 1136.96 ng/mL (FEU) — ABNORMAL HIGH (ref 0.00–499.00)

## 2019-12-05 NOTE — Progress Notes (Signed)
PROGRESS NOTE    TORIANNE LAFLAM  EXH:[id number] DOB: [birth year] DOA: 12/03/2019 PCP: Lavera Guise, MD   Brief Narrative:  Briefly, this is a 59 year old female with history significant for ulcerative colitis status post colectomy with ileal pouch in 2004.  She is not on any immunosuppressive medications.  Tested positive on 11/22/2019.  Presented to hospital yesterday with worsening symptoms including shortness of breath.  Chest x-ray does reveal pneumonia.  Saturations adequate on room air however desaturates very quickly with exertion.  8/26: Patient seen and examined.  Fatigue appears improved.  Mentating clearly.  Endorses some headache.  On room air   Assessment & Plan:   Principal Problem:   Pneumonia due to COVID-19 virus Active Problems:   History of ulcerative colitis with history of colectomy 2004   Acute respiratory failure due to COVID-19 [facility])  Pneumonia due to COVID-19 virus, unvaccinated Acute respiratory failure ruled out Patient has basically been on room air since admission Transient hypoxia due to Covid infection Plan: Continue remdesivir, 12/04/2019- Continue Decadron, 12/04/2019- Bronchodilators via MDI Antitussives Vitamins Airborne and contact isolation Supplemental oxygen as needed Proning as tolerated Stressed I-S and flutter valve use Patient remained stable anticipate discharge on 12/06/2019 May be a good candidate for outpatient remdesivir infusion    History of ulcerative colitis s/p ileal pouch 2004 -Loperamide as needed  Migraine As needed Fioricet   DVT prophylaxis: Lovenox Code Status: Full Family Communication: Husband Nalany Steedley via phone [phone] Disposition Plan: Status is: Inpatient  Remains inpatient appropriate because:Inpatient level of care appropriate due to severity of illness   Dispo: The patient is from: Home              Anticipated d/c is to: Home              Anticipated d/c date is: 1 day              Patient  currently is not medically stable to d/c.  Plan for 1 additional day of inpatient monitoring and treatment.  Patient remains fever free and on room air for the next 24 hours anticipate discharge on 12/06/2019   Consultants:   None  Procedures:  None  Antimicrobials:   Remdesivir   Subjective: Patient seen and examined.  Report some headache.  Improvement in shortness of breath.  No other complaints.  Objective: Vitals:   12/05/19 0014 12/05/19 0419 12/05/19 0812 12/05/19 1200  BP: 123/76 122/75 130/78 124/73  Pulse: 77 74 69 75  Resp: 16 16 17 13   Temp: 97.8 F (36.6 C) 98 F (36.7 C) 97.8 F (36.6 C)   TempSrc: Oral Oral Oral   SpO2: 95% 95% 95% 97%  Weight:      Height:       No intake or output data in the 24 hours ending 12/05/19 1321 Filed Weights   12/03/19 1907  Weight: 82.6 kg    Examination:  General exam: Appears calm and comfortable  Respiratory system: Bibasilar crackles.  Air entry equal.  Room air.  Normal work of breathing Cardiovascular system: S1 & S2 heard, RRR. No JVD, murmurs, rubs, gallops or clicks. No pedal edema. Gastrointestinal system: Abdomen is nondistended, soft and nontender. No organomegaly or masses felt. Normal bowel sounds heard. Central nervous system: Alert and oriented. No focal neurological deficits. Extremities: Symmetric 5 x 5 power. Skin: No rashes, lesions or ulcers Psychiatry: Judgement and insight appear normal. Mood & affect appropriate.     Data Reviewed: I have  personally reviewed following labs and imaging studies  CBC: Recent Labs  Lab 12/03/19 1611 12/04/19 0018 12/05/19 0605  WBC 4.2 4.3 8.5  NEUTROABS 3.2 3.1 7.5  HGB 12.8 12.4 12.2  HCT 38.2 37.1 36.2  MCV 91.4 93.0 92.1  PLT 221 220 599   Basic Metabolic Panel: Recent Labs  Lab 12/03/19 1611 12/04/19 0018 12/05/19 0605  NA 138 142 140  K 3.6 3.5 3.6  CL 100 103 103  CO2 27 28 24   GLUCOSE 105* 110* 161*  BUN 10 9 17   CREATININE 0.75  0.51 0.59  CALCIUM 8.8* 8.9 9.1   GFR: Estimated Creatinine Clearance: 85.9 mL/min (by C-G formula based on SCr of 0.59 mg/dL). Liver Function Tests: Recent Labs  Lab 12/03/19 1611 12/05/19 0605  AST 45* 71*  ALT 36 56*  ALKPHOS 163* 181*  BILITOT 0.9 0.9  PROT 7.6 7.1  ALBUMIN 3.8 3.4*   No results for input(s): LIPASE, AMYLASE in the last 168 hours. No results for input(s): AMMONIA in the last 168 hours. Coagulation Profile: No results for input(s): INR, PROTIME in the last 168 hours. Cardiac Enzymes: No results for input(s): CKTOTAL, CKMB, CKMBINDEX, TROPONINI in the last 168 hours. BNP (last 3 results) No results for input(s): PROBNP in the last 8760 hours. HbA1C: No results for input(s): HGBA1C in the last 72 hours. CBG: No results for input(s): GLUCAP in the last 168 hours. Lipid Profile: No results for input(s): CHOL, HDL, LDLCALC, TRIG, CHOLHDL, LDLDIRECT in the last 72 hours. Thyroid Function Tests: No results for input(s): TSH, T4TOTAL, FREET4, T3FREE, THYROIDAB in the last 72 hours. Anemia Panel: Recent Labs    12/04/19 0018 12/05/19 0605  FERRITIN 563* 512*   Sepsis Labs: No results for input(s): PROCALCITON, LATICACIDVEN in the last 168 hours.  Recent Results (from the past 240 hour(s))  SARS Coronavirus 2 by RT PCR (hospital order, performed in [facility] hospital lab) Nasopharyngeal Nasopharyngeal Swab     Status: Abnormal   Collection Time: 12/03/19 12:18 AM   Specimen: Nasopharyngeal Swab  Result Value Ref Range Status   SARS Coronavirus 2 POSITIVE (A) NEGATIVE Corrected    Comment: RESULT CALLED TO, READ BACK BY AND VERIFIED WITH: KATY SOUST RN AT 0127 ON 12/04/19 SKL (NOTE) SARS-CoV-2 target nucleic acids are DETECTED  SARS-CoV-2 RNA is generally detectable in upper respiratory specimens  during the acute phase of infection.  Positive results are indicative  of the presence of the identified virus, but do not rule out bacterial infection or  co-infection with other pathogens not detected by the test.  Clinical correlation with patient history and  other diagnostic information is necessary to determine patient infection status.  The expected result is negative.  Fact Sheet for Patients:   StrictlyIdeas.no   Fact Sheet for Healthcare Providers:   BankingDealers.co.za    This test is not yet approved or cleared by the [place] FDA and  has been authorized for detection and/or diagnosis of SARS-CoV-2 by FDA under an Emergency Use Authorization (EUA).  This EUA will remain in effect (meaning this  test can be used) for the duration of  the COVID-19 declaration under Section 564(b)(1) of the Act, 21 U.S.C. section 360-bbb-3(b)(1), unless the authorization is terminated or revoked sooner.  Performed at [facility], [address] [place], [place] [address] CORRECTED ON 08/25 AT 7793: PREVIOUSLY REPORTED AS POSITIVE katy soust @0127  on 12/04/19 skl          Radiology Studies: [facility]  Chest 2 View  Result Date: 12/03/2019 CLINICAL DATA:  59 year old female with cough and shortness of breath, positive for COVID-19 11 days ago. Former smoker. EXAM: CHEST - 2 VIEW COMPARISON:  Chest radiographs 02/26/2018. FINDINGS: Bilateral patchy and indistinct peripheral and basilar pulmonary opacity, new since 2019. No superimposed pneumothorax, pulmonary edema or pleural effusion. There are underlying mild chronic generalized increased interstitial markings. Mediastinal contours remain normal. Visualized tracheal air column is within normal limits. No acute osseous abnormality identified. Negative visible bowel gas pattern. IMPRESSION: Patchy and confluent bilateral peripheral pulmonary opacity typical in appearance for COVID-19 Pneumonia. No pleural effusion. Electronically Signed   By: Genevie Ann M.D.   On: 12/03/2019 16:18        Scheduled Meds: . albuterol  2 puff Inhalation Q6H    . vitamin C  500 mg Oral Daily  . dexamethasone (DECADRON) injection  6 mg Intravenous Q24H  . enoxaparin (LOVENOX) injection  40 mg Subcutaneous Q24H  . feeding supplement (ENSURE ENLIVE)  237 mL Oral TID BM  . multivitamin with minerals  1 tablet Oral Daily  . zinc sulfate  220 mg Oral Daily   Continuous Infusions: . remdesivir 100 mg in NS 100 mL 100 mg (12/05/19 1020)     LOS: 1 day    Time spent: 25 minutes    Sidney Ace, MD [facility] Pager [phone]  If 7PM-7AM, please contact night-coverage 12/05/2019, 1:21 PM

## 2019-12-06 LAB — CBC WITH DIFFERENTIAL/PLATELET
Abs Immature Granulocytes: 0.18 10*3/uL — ABNORMAL HIGH (ref 0.00–0.07)
Basophils Absolute: 0.1 10*3/uL (ref 0.0–0.1)
Basophils Relative: 0 %
Eosinophils Absolute: 0 10*3/uL (ref 0.0–0.5)
Eosinophils Relative: 0 %
HCT: 35.6 % — ABNORMAL LOW (ref 36.0–46.0)
Hemoglobin: 12.4 g/dL (ref 12.0–15.0)
Immature Granulocytes: 2 %
Lymphocytes Relative: 6 %
Lymphs Abs: 0.7 10*3/uL (ref 0.7–4.0)
MCH: 31 pg (ref 26.0–34.0)
MCHC: 34.8 g/dL (ref 30.0–36.0)
MCV: 89 fL (ref 80.0–100.0)
Monocytes Absolute: 0.3 10*3/uL (ref 0.1–1.0)
Monocytes Relative: 2 %
Neutro Abs: 10.9 10*3/uL — ABNORMAL HIGH (ref 1.7–7.7)
Neutrophils Relative %: 90 %
Platelets: 316 10*3/uL (ref 150–400)
RBC: 4 MIL/uL (ref 3.87–5.11)
RDW: 12.5 % (ref 11.5–15.5)
WBC: 12 10*3/uL — ABNORMAL HIGH (ref 4.0–10.5)
nRBC: 0 % (ref 0.0–0.2)

## 2019-12-06 LAB — COMPREHENSIVE METABOLIC PANEL
ALT: 52 U/L — ABNORMAL HIGH (ref 0–44)
AST: 45 U/L — ABNORMAL HIGH (ref 15–41)
Albumin: 3.1 g/dL — ABNORMAL LOW (ref 3.5–5.0)
Alkaline Phosphatase: 163 U/L — ABNORMAL HIGH (ref 38–126)
Anion gap: 12 (ref 5–15)
BUN: 19 mg/dL (ref 6–20)
CO2: 25 mmol/L (ref 22–32)
Calcium: 8.7 mg/dL — ABNORMAL LOW (ref 8.9–10.3)
Chloride: 102 mmol/L (ref 98–111)
Creatinine, Ser: 0.7 mg/dL (ref 0.44–1.00)
GFR calc Af Amer: >60 mL/min (ref >60)
GFR calc non Af Amer: >60 mL/min (ref >60)
Glucose, Bld: 181 mg/dL — ABNORMAL HIGH (ref 70–99)
Potassium: 3.8 mmol/L (ref 3.5–5.1)
Sodium: 139 mmol/L (ref 135–145)
Total Bilirubin: 0.9 mg/dL (ref 0.3–1.2)
Total Protein: 6.7 g/dL (ref 6.5–8.1)

## 2019-12-06 LAB — C-REACTIVE PROTEIN: CRP: 2.4 mg/dL — ABNORMAL HIGH (ref <1.0)

## 2019-12-06 LAB — FERRITIN: Ferritin: 505 ng/mL — ABNORMAL HIGH (ref 11–307)

## 2019-12-06 LAB — FIBRIN DERIVATIVES D-DIMER (ARMC ONLY): Fibrin derivatives D-dimer (ARMC): 735.69 ng/mL (FEU) — ABNORMAL HIGH (ref 0.00–499.00)

## 2019-12-06 MED ORDER — DEXAMETHASONE 6 MG PO TABS: 6.0000 mg | ORAL_TABLET | Freq: Four times a day (QID) | ORAL | 0 refills | Status: AC

## 2019-12-06 MED ORDER — ASCORBIC ACID 500 MG PO TABS: 500.0000 mg | ORAL_TABLET | Freq: Every day | ORAL | 0 refills | Status: AC

## 2019-12-06 MED ORDER — ZINC SULFATE 220 (50 ZN) MG PO CAPS: 220.0000 mg | ORAL_CAPSULE | Freq: Every day | ORAL | 0 refills | Status: AC

## 2019-12-06 MED ORDER — ADULT MULTIVITAMIN W/MINERALS CH: 1.0000 | ORAL_TABLET | Freq: Every day | ORAL | 0 refills | Status: AC

## 2019-12-06 NOTE — Progress Notes (Signed)
Patient scheduled for outpatient Remdesivir infusions at 11am on Saturday 8/28 and Sunday 8/29 at [facility]. Please inform the patient to park at [place], as staff will be escorting the patient through the [place] entrance of the hospital.    There is a wave flag banner located near the entrance on [address]. Turn into this entrance and immediately turn left and park in 1 of the 5 designated Covid Infusion Parking spots. There is a phone number on the sign, please call and let the staff know what spot you are in and we will come out and get you. For questions call [phone].  Thanks.

## 2019-12-06 NOTE — Discharge Summary (Signed)
Physician Discharge Summary  Ruth Morales:[id number] DOB: 1960-10-16 DOA: 12/03/2019  PCP: Lavera Guise, MD  Admit date: 12/03/2019 Discharge date: 12/06/2019  Admitted From: Home Disposition: Home  Recommendations for Outpatient Follow-up:  1. Follow up with PCP in 1-2 weeks 2. Go to outpatient remdesivir infusion at 11 AM on Saturday 28 and Sunday 29 at [place]: No Equipment/Devices: None Discharge Condition: Stable CODE STATUS: Full Diet recommendation: Heart Healthy / Carb Modified  Brief/Interim Summary: Briefly, this is a 59 year old female with history significant for ulcerative colitis status post colectomy with ileal pouch in 2004. She is not on any immunosuppressive medications. Tested positive on 11/22/2019. Presented to hospital yesterday with worsening symptoms including shortness of breath. Chest x-ray does reveal pneumonia. Saturations adequate on room air however desaturates very quickly with exertion.  8/26: Patient seen and examined.  Fatigue appears improved.  Mentating clearly.  Endorses some headache.  On room air  8/27: Patient seen and examined the day of discharge. Back to baseline. Mentating clearly. Fatigue improved. Headache resolved. On room air. Stable for discharge home. Will complete outpatient remdesivir infusion on 12/07/19 12/08/19. Infusion clinic aware.  Discharge Diagnoses:  Principal Problem:   Pneumonia due to COVID-19 virus Active Problems:   History of ulcerative colitis with history of colectomy 2004   Acute respiratory failure due to COVID-19 [facility])  Pneumonia due to COVID-19 virus, unvaccinated Acute respiratory failure ruled out Patient has basically been on room air since admission Transient hypoxia due to Covid infection Plan: Continue remdesivir, 12/04/2019-12/08/19. Last 2 doses will be outpatient Continue Decadron, 12/04/2019-12/11/19. Remainder of course prescribed on discharge Patient presents was a  long hospital outpatient remdesivir infusion over the weekend to complete her remdesivir course. Clear return to ED instructions provided. Home isolation and Covid safety advice provided.  History of ulcerative colitis s/p ileal pouch 2004 -Loperamide as needed  Migraine As needed Fioricet  Discharge Instructions  Discharge Instructions    Diet - low sodium heart healthy   Complete by: As directed    Increase activity slowly   Complete by: As directed      Allergies as of 12/06/2019      Reactions   Morphine Other (See Comments)   Hallucination.   Penicillins Anaphylaxis, Other (See Comments)   Headaches per patient Has patient had a PCN reaction causing immediate rash, facial/tongue/throat swelling, SOB or lightheadedness with hypotension: Unknown Has patient had a PCN reaction causing severe rash involving mucus membranes or skin necrosis: No Has patient had a PCN reaction that required hospitalization: No Has patient had a PCN reaction occurring within the last 10 years: No If all of the above answers are "NO", then may proceed with Cephalosporin use.   Macrobid [nitrofurantoin Macrocrystal] Other (See Comments)   Unknown   Benadryl [diphenhydramine Hcl] Anxiety, Other (See Comments)   Per patient "craziness"      Medication List    TAKE these medications   ascorbic acid 500 MG tablet Commonly known as: VITAMIN C Take 1 tablet (500 mg total) by mouth daily. Start taking on: December 07, 2019   Calcium Citrate + D3 Maximum 315-250 MG-UNIT Tabs Generic drug: Calcium Citrate-Vitamin D Take by mouth.   cyanocobalamin 1000 MCG/ML injection Commonly known as: (VITAMIN B-12) Inject 1,000 mcg into the muscle once.   dexamethasone 6 MG tablet Commonly known as: Decadron Take 1 tablet (6 mg total) by mouth every 6 (six) hours for 7 days.   estradiol 1  MG tablet Commonly known as: ESTRACE Take 1 mg by mouth daily.   ibuprofen 200 MG tablet Commonly known as:  ADVIL Take 600 mg by mouth every 6 (six) hours as needed for headache or moderate pain.   loperamide 2 MG capsule Commonly known as: IMODIUM Take 4 mg by mouth 2 (two) times daily.   multivitamin with minerals Tabs tablet Take 1 tablet by mouth daily. Start taking on: December 07, 2019   progesterone 100 MG capsule Commonly known as: PROMETRIUM Take 1 capsule (100 mg total) by mouth daily.   promethazine 12.5 MG tablet Commonly known as: PHENERGAN Take 1 tablet (12.5 mg total) by mouth every 8 (eight) hours as needed for nausea or vomiting.   Vitamin D (Ergocalciferol) 1.25 MG (50000 UNIT) Caps capsule Commonly known as: DRISDOL Take 1 capsule (50,000 Units total) by mouth every 7 (seven) days.   zinc sulfate 220 (50 Zn) MG capsule Take 1 capsule (220 mg total) by mouth daily. Start taking on: December 07, 2019       Allergies  Allergen Reactions  . Morphine Other (See Comments)    Hallucination.  Marland Kitchen Penicillins Anaphylaxis and Other (See Comments)    Headaches per patient Has patient had a PCN reaction causing immediate rash, facial/tongue/throat swelling, SOB or lightheadedness with hypotension: Unknown Has patient had a PCN reaction causing severe rash involving mucus membranes or skin necrosis: No Has patient had a PCN reaction that required hospitalization: No Has patient had a PCN reaction occurring within the last 10 years: No If all of the above answers are "NO", then may proceed with Cephalosporin use.   Santiago Bur [Nitrofurantoin Macrocrystal] Other (See Comments)    Unknown  . Benadryl [Diphenhydramine Hcl] Anxiety and Other (See Comments)    Per patient "craziness"    Consultations:  None   Procedures/Studies: DG Chest 2 View  Result Date: 12/03/2019 CLINICAL DATA:  59 year old female with cough and shortness of breath, positive for COVID-19 11 days ago. Former smoker. EXAM: CHEST - 2 VIEW COMPARISON:  Chest radiographs 02/26/2018. FINDINGS: Bilateral  patchy and indistinct peripheral and basilar pulmonary opacity, new since 2019. No superimposed pneumothorax, pulmonary edema or pleural effusion. There are underlying mild chronic generalized increased interstitial markings. Mediastinal contours remain normal. Visualized tracheal air column is within normal limits. No acute osseous abnormality identified. Negative visible bowel gas pattern. IMPRESSION: Patchy and confluent bilateral peripheral pulmonary opacity typical in appearance for COVID-19 Pneumonia. No pleural effusion. Electronically Signed   By: Genevie Ann M.D.   On: 12/03/2019 16:18    (Echo, Carotid, EGD, Colonoscopy, ERCP)    Subjective: Patient seen and examined the day of discharge. No complaints. Feels well. Stable for discharge home  Discharge Exam: Vitals:   12/06/19 0110 12/06/19 0554  BP: 132/85 (!) 142/79  Pulse: 81 (!) 58  Resp:  16  Temp: 97.8 F (36.6 C) 97.6 F (36.4 C)  SpO2: 94% 95%   Vitals:   12/05/19 1200 12/05/19 2048 12/06/19 0110 12/06/19 0554  BP: 124/73 139/76 132/85 (!) 142/79  Pulse: 75 73 81 (!) 58  Resp: 13 16  16   Temp:  97.9 F (36.6 C) 97.8 F (36.6 C) 97.6 F (36.4 C)  TempSrc:  Oral Oral Oral  SpO2: 97% 98% 94% 95%  Weight:      Height:        General: Pt is alert, awake, not in acute distress Cardiovascular: RRR, S1/S2 +, no rubs, no gallops Respiratory: CTA bilaterally, no  wheezing, no rhonchi Abdominal: Soft, NT, ND, bowel sounds + Extremities: no edema, no cyanosis    The results of significant diagnostics from this hospitalization (including imaging, microbiology, ancillary and laboratory) are listed below for reference.     Microbiology: Recent Results (from the past 240 hour(s))  SARS Coronavirus 2 by RT PCR (hospital order, performed in [facility] hospital lab) Nasopharyngeal Nasopharyngeal Swab     Status: Abnormal   Collection Time: 12/03/19 12:18 AM   Specimen: Nasopharyngeal Swab  Result Value Ref Range Status    SARS Coronavirus 2 POSITIVE (A) NEGATIVE Corrected    Comment: RESULT CALLED TO, READ BACK BY AND VERIFIED WITH: KATY SOUST RN AT 0127 ON 12/04/19 SKL (NOTE) SARS-CoV-2 target nucleic acids are DETECTED  SARS-CoV-2 RNA is generally detectable in upper respiratory specimens  during the acute phase of infection.  Positive results are indicative  of the presence of the identified virus, but do not rule out bacterial infection or co-infection with other pathogens not detected by the test.  Clinical correlation with patient history and  other diagnostic information is necessary to determine patient infection status.  The expected result is negative.  Fact Sheet for Patients:   StrictlyIdeas.no   Fact Sheet for Healthcare Providers:   BankingDealers.co.za    This test is not yet approved or cleared by the [place] FDA and  has been authorized for detection and/or diagnosis of SARS-CoV-2 by FDA under an Emergency Use Authorization (EUA).  This EUA will remain in effect (meaning this  test can be used) for the duration of  the COVID-19 declaration under Section 564(b)(1) of the Act, 21 U.S.C. section 360-bbb-3(b)(1), unless the authorization is terminated or revoked sooner.  Performed at [facility], [address] [place], [place] [address] CORRECTED ON 08/25 AT 0752: PREVIOUSLY REPORTED AS POSITIVE katy soust @0127  on 12/04/19 skl      Labs: BNP (last 3 results) Recent Labs    12/04/19 0018  BNP 28.0   Basic Metabolic Panel: Recent Labs  Lab 12/03/19 1611 12/04/19 0018 12/05/19 0605 12/06/19 0432  NA 138 142 140 139  K 3.6 3.5 3.6 3.8  CL 100 103 103 102  CO2 27 28 24 25   GLUCOSE 105* 110* 161* 181*  BUN 10 9 17 19   CREATININE 0.75 0.51 0.59 0.70  CALCIUM 8.8* 8.9 9.1 8.7*   Liver Function Tests: Recent Labs  Lab 12/03/19 1611 12/05/19 0605 12/06/19 0432  AST 45* 71* 45*  ALT 36 56* 52*  ALKPHOS  163* 181* 163*  BILITOT 0.9 0.9 0.9  PROT 7.6 7.1 6.7  ALBUMIN 3.8 3.4* 3.1*   No results for input(s): LIPASE, AMYLASE in the last 168 hours. No results for input(s): AMMONIA in the last 168 hours. CBC: Recent Labs  Lab 12/03/19 1611 12/04/19 0018 12/05/19 0605 12/06/19 0432  WBC 4.2 4.3 8.5 12.0*  NEUTROABS 3.2 3.1 7.5 10.9*  HGB 12.8 12.4 12.2 12.4  HCT 38.2 37.1 36.2 35.6*  MCV 91.4 93.0 92.1 89.0  PLT 221 220 285 316   Cardiac Enzymes: No results for input(s): CKTOTAL, CKMB, CKMBINDEX, TROPONINI in the last 168 hours. BNP: Invalid input(s): POCBNP CBG: No results for input(s): GLUCAP in the last 168 hours. D-Dimer No results for input(s): DDIMER in the last 72 hours. Hgb A1c No results for input(s): HGBA1C in the last 72 hours. Lipid Profile No results for input(s): CHOL, HDL, LDLCALC, TRIG, CHOLHDL, LDLDIRECT in the last 72 hours. Thyroid function studies No  results for input(s): TSH, T4TOTAL, T3FREE, THYROIDAB in the last 72 hours.  Invalid input(s): FREET3 Anemia work up Recent Labs    12/05/19 0605 12/06/19 0432  FERRITIN 512* 505*   Urinalysis    Component Value Date/Time   COLORURINE STRAW (A) 01/15/2015 0830   APPEARANCEUR Clear 12/22/2017 0859   LABSPEC 1.020 01/15/2015 0830   LABSPEC 1.040 03/12/2014 0303   PHURINE 5.5 01/15/2015 0830   GLUCOSEU Negative 12/22/2017 0859   GLUCOSEU Negative 03/12/2014 0303   HGBUR NEGATIVE 01/15/2015 0830   BILIRUBINUR Negative 12/22/2017 0859   BILIRUBINUR Negative 03/12/2014 0303   KETONESUR NEGATIVE 01/15/2015 0830   PROTEINUR Negative 12/22/2017 0859   PROTEINUR NEGATIVE 01/15/2015 0830   UROBILINOGEN 0.2 07/10/2017 1609   NITRITE Negative 12/22/2017 0859   NITRITE NEGATIVE 01/15/2015 0830   LEUKOCYTESUR 2+ (A) 12/22/2017 0859   LEUKOCYTESUR Negative 03/12/2014 0303   Sepsis Labs Invalid input(s): PROCALCITONIN,  WBC,  LACTICIDVEN Microbiology Recent Results (from the past 240 hour(s))  SARS  Coronavirus 2 by RT PCR (hospital order, performed in [place] hospital lab) Nasopharyngeal Nasopharyngeal Swab     Status: Abnormal   Collection Time: 12/03/19 12:18 AM   Specimen: Nasopharyngeal Swab  Result Value Ref Range Status   SARS Coronavirus 2 POSITIVE (A) NEGATIVE Corrected    Comment: RESULT CALLED TO, READ BACK BY AND VERIFIED WITH: KATY SOUST RN AT 0127 ON 12/04/19 SKL (NOTE) SARS-CoV-2 target nucleic acids are DETECTED  SARS-CoV-2 RNA is generally detectable in upper respiratory specimens  during the acute phase of infection.  Positive results are indicative  of the presence of the identified virus, but do not rule out bacterial infection or co-infection with other pathogens not detected by the test.  Clinical correlation with patient history and  other diagnostic information is necessary to determine patient infection status.  The expected result is negative.  Fact Sheet for Patients:   StrictlyIdeas.no   Fact Sheet for Healthcare Providers:   BankingDealers.co.za    This test is not yet approved or cleared by the [place] FDA and  has been authorized for detection and/or diagnosis of SARS-CoV-2 by FDA under an Emergency Use Authorization (EUA).  This EUA will remain in effect (meaning this  test can be used) for the duration of  the COVID-19 declaration under Section 564(b)(1) of the Act, 21 U.S.C. section 360-bbb-3(b)(1), unless the authorization is terminated or revoked sooner.  Performed at [facility], [address] [place], [place] [address] CORRECTED ON 08/25 AT 1884: PREVIOUSLY REPORTED AS POSITIVE katy soust @0127  on 12/04/19 skl      Time coordinating discharge: Over 30 minutes  SIGNED:   Sidney Ace, MD  [facility] 12/06/2019, 1:13 PM Pager   If 7PM-7AM, please contact night-coverage

## 2019-12-06 NOTE — Discharge Instructions (Signed)
Patient scheduled for outpatient Remdesivir infusions at 11am on Saturday 8/28 and Sunday 8/29 at [facility]. Please inform the patient to park at [place], as staff will be escorting the patient through the [place] entrance of the hospital.    There is a wave flag banner located near the entrance on [address]. Turn into this entrance and immediately turn left and park in 1 of the 5 designated Covid Infusion Parking spots. There is a phone number on the sign, please call and let the staff know what spot you are in and we will come out and get you. For questions call [phone].  Thanks.

## 2019-12-07 ENCOUNTER — Ambulatory Visit (HOSPITAL_COMMUNITY)
Admit: 2019-12-07 | Discharge: 2019-12-07 | Disposition: A | Discharge status: Home / Self Care | Payer: 59 | Attending: Pulmonary Disease | Admitting: Pulmonary Disease

## 2019-12-07 DIAGNOSIS — J1282 Pneumonia due to coronavirus disease 2019: Secondary | ICD-10-CM | POA: Diagnosis not present

## 2019-12-07 DIAGNOSIS — U071 COVID-19: Secondary | ICD-10-CM | POA: Diagnosis present

## 2019-12-07 MED ORDER — FAMOTIDINE IN NACL 20-0.9 MG/50ML-% IV SOLN: 20.0000 mg | Freq: Once | INTRAVENOUS | Status: DC | PRN

## 2019-12-07 MED ORDER — METHYLPREDNISOLONE SODIUM SUCC 125 MG IJ SOLR: 125.0000 mg | Freq: Once | INTRAMUSCULAR | Status: DC | PRN

## 2019-12-07 MED ORDER — ALBUTEROL SULFATE HFA 108 (90 BASE) MCG/ACT IN AERS: 2.0000 | INHALATION_SPRAY | Freq: Once | RESPIRATORY_TRACT | Status: DC | PRN

## 2019-12-07 MED ORDER — EPINEPHRINE 0.3 MG/0.3ML IJ SOAJ: 0.3000 mg | Freq: Once | INTRAMUSCULAR | Status: DC | PRN

## 2019-12-07 MED ORDER — SODIUM CHLORIDE 0.9 % IV SOLN: INTRAVENOUS | Status: DC | PRN

## 2019-12-07 MED ORDER — SODIUM CHLORIDE 0.9 % IV SOLN
100.0000 mg | Freq: Once | INTRAVENOUS | Status: AC
  Administered 2019-12-07: 100 mg via INTRAVENOUS

## 2019-12-07 MED ORDER — SODIUM CHLORIDE 0.9 % IV SOLN
100.0000 mg | Freq: Once | INTRAVENOUS | Status: DC
  Filled 2019-12-07: qty 20

## 2019-12-07 MED ORDER — DIPHENHYDRAMINE HCL 50 MG/ML IJ SOLN: 50.0000 mg | Freq: Once | INTRAMUSCULAR | Status: DC | PRN

## 2019-12-07 NOTE — Progress Notes (Deleted)
  Diagnosis: COVID-19  Physician: Dr. Joya Gaskins  Procedure: Covid Infusion Clinic Med: remdesivir infusion - Provided patient with remdesivir fact sheet for patients, parents and caregivers prior to infusion.  Complications: No immediate complications noted.  Discharge: Discharged home   Tia Masker 12/07/2019

## 2019-12-07 NOTE — Progress Notes (Signed)
  Diagnosis: COVID-19  Physician:  Procedure: Covid Infusion Clinic Med: remdesivir infusion - Provided patient with remdesivir fact sheet for patients, parents and caregivers prior to infusion.  Complications: No immediate complications noted.  Discharge: Discharged home   Janne Napoleon 12/07/2019

## 2019-12-08 ENCOUNTER — Other Ambulatory Visit (HOSPITAL_COMMUNITY): Payer: Self-pay | Admitting: Physician Assistant

## 2019-12-08 ENCOUNTER — Ambulatory Visit (HOSPITAL_COMMUNITY)
Admit: 2019-12-08 | Discharge: 2019-12-08 | Disposition: A | Discharge status: Home / Self Care | Payer: 59 | Source: Ambulatory Visit | Attending: Pulmonary Disease | Admitting: Pulmonary Disease

## 2019-12-08 DIAGNOSIS — U071 COVID-19: Secondary | ICD-10-CM | POA: Diagnosis not present

## 2019-12-08 DIAGNOSIS — J1282 Pneumonia due to coronavirus disease 2019: Secondary | ICD-10-CM | POA: Insufficient documentation

## 2019-12-08 MED ORDER — METHYLPREDNISOLONE SODIUM SUCC 125 MG IJ SOLR: 125.0000 mg | Freq: Once | INTRAMUSCULAR | Status: DC | PRN

## 2019-12-08 MED ORDER — ALBUTEROL SULFATE HFA 108 (90 BASE) MCG/ACT IN AERS: 2.0000 | INHALATION_SPRAY | Freq: Once | RESPIRATORY_TRACT | Status: DC | PRN

## 2019-12-08 MED ORDER — SODIUM CHLORIDE 0.9 % IV SOLN
100.0000 mg | Freq: Once | INTRAVENOUS | Status: AC
  Administered 2019-12-08: 100 mg via INTRAVENOUS
  Filled 2019-12-08: qty 20

## 2019-12-08 MED ORDER — FAMOTIDINE IN NACL 20-0.9 MG/50ML-% IV SOLN: 20.0000 mg | Freq: Once | INTRAVENOUS | Status: DC | PRN

## 2019-12-08 MED ORDER — SODIUM CHLORIDE 0.9 % IV SOLN: INTRAVENOUS | Status: DC | PRN

## 2019-12-08 MED ORDER — EPINEPHRINE 0.3 MG/0.3ML IJ SOAJ: 0.3000 mg | Freq: Once | INTRAMUSCULAR | Status: DC | PRN

## 2019-12-08 MED ORDER — DIPHENHYDRAMINE HCL 50 MG/ML IJ SOLN: 50.0000 mg | Freq: Once | INTRAMUSCULAR | Status: DC | PRN

## 2019-12-08 NOTE — Discharge Instructions (Signed)

## 2019-12-08 NOTE — Progress Notes (Signed)
  Diagnosis: COVID-19  Physician: Joya Gaskins MD  Procedure: Covid Infusion Clinic Med: remdesivir infusion - Provided patient with remdesivir fact sheet for patients, parents and caregivers prior to infusion.  Complications: No immediate complications noted.  Discharge: Discharged home   Ruth Morales 12/08/2019

## 2019-12-09 ENCOUNTER — Telehealth: Payer: Self-pay | Admitting: Internal Medicine

## 2019-12-09 ENCOUNTER — Telehealth: Payer: Self-pay

## 2019-12-09 NOTE — Telephone Encounter (Signed)
Left a message and asked pt to call back and sdchd appt with [name] this week, per [facility]. Ruth Morales

## 2019-12-09 NOTE — Telephone Encounter (Signed)
Spoke with pt after discharge from hospital for COVID 19 pneumonia. She feels better, will be seen in the office this week

## 2019-12-12 ENCOUNTER — Ambulatory Visit: Payer: 59 | Admitting: Internal Medicine

## 2019-12-12 ENCOUNTER — Encounter: Payer: Self-pay | Admitting: Internal Medicine

## 2019-12-12 VITALS — HR 78 | Ht 68.5 in | Wt 182.0 lb

## 2019-12-12 DIAGNOSIS — G4701 Insomnia due to medical condition: Secondary | ICD-10-CM

## 2019-12-12 DIAGNOSIS — U071 COVID-19: Secondary | ICD-10-CM | POA: Diagnosis not present

## 2019-12-12 DIAGNOSIS — K219 Gastro-esophageal reflux disease without esophagitis: Secondary | ICD-10-CM

## 2019-12-12 DIAGNOSIS — R5383 Other fatigue: Secondary | ICD-10-CM

## 2019-12-12 DIAGNOSIS — J1282 Pneumonia due to coronavirus disease 2019: Secondary | ICD-10-CM

## 2019-12-12 NOTE — Progress Notes (Signed)
[facility] [place], [place] [address]  Internal MEDICINE  Transitional care management // Virtual visit   Patient Name: Ruth Morales  [id number]  [id number]  Date of Service: 12/12/2019  I connected with the patient at 11:05 am by telephone and verified the patients identity using two identifiers.   I discussed the limitations, risks, security and privacy concerns of performing an evaluation and management service by telephone and the availability of in person appointments. I also discussed with the patient that there may be a patient responsible charge related to the service.  The patient expressed understanding and agrees to proceed.    Chief Complaint  Patient presents with  . Telephone Screen    Phone no for video [phone]  . Telephone Assessment  . Hospitalization Follow-up    positive for covid   . Cough  . Abdominal Pain  . Anxiety    HPI Pt was hospitalized for Covid -19 pneumonia. Was discharged on 8/27, took 3 doses of Remdesivir, sent home with decadron taper.   Overall she feels better but somewhat tired c/o not sleeping well and stomach acidity and bloating after taking steroids. Her po intake is not that great either    Current Medication: Outpatient Encounter Medications as of 12/12/2019  Medication Sig  . ascorbic acid (VITAMIN C) 500 MG tablet Take 1 tablet (500 mg total) by mouth daily.  . Calcium Citrate-Vitamin D (CALCIUM CITRATE + D3 MAXIMUM) 315-250 MG-UNIT TABS Take by mouth.  . cyanocobalamin (,VITAMIN B-12,) 1000 MCG/ML injection Inject 1,000 mcg into the muscle once.  Marland Kitchen dexamethasone (DECADRON) 6 MG tablet Take 1 tablet (6 mg total) by mouth every 6 (six) hours for 7 days.  Marland Kitchen estradiol (ESTRACE) 1 MG tablet Take 1 mg by mouth daily.  Marland Kitchen ibuprofen (ADVIL,MOTRIN) 200 MG tablet Take 600 mg by mouth every 6 (six) hours as needed for headache or moderate pain.   Marland Kitchen loperamide (IMODIUM) 2 MG capsule Take 4 mg by mouth 2 (two) times  daily.   . Multiple Vitamin (MULTIVITAMIN WITH MINERALS) TABS tablet Take 1 tablet by mouth daily.  . progesterone (PROMETRIUM) 100 MG capsule Take 1 capsule (100 mg total) by mouth daily.  . promethazine (PHENERGAN) 12.5 MG tablet Take 1 tablet (12.5 mg total) by mouth every 8 (eight) hours as needed for nausea or vomiting.  . Vitamin D, Ergocalciferol, (DRISDOL) 1.25 MG (50000 UT) CAPS capsule Take 1 capsule (50,000 Units total) by mouth every 7 (seven) days.  Marland Kitchen zinc sulfate 220 (50 Zn) MG capsule Take 1 capsule (220 mg total) by mouth daily.   No facility-administered encounter medications on file as of 12/12/2019.    Surgical History: Past Surgical History:  Procedure Laterality Date  . BREAST BIOPSY Left 2003   needle bx-neg  . BREAST SURGERY Bilateral 2004   breast reduction  . CATARACT EXTRACTION W/PHACO Left 02/06/2018   Procedure: CATARACT EXTRACTION PHACO AND INTRAOCULAR LENS PLACEMENT (IOC);  Surgeon: Birder Robson, MD;  Location: [facility] ORS;  Service: Ophthalmology;  Laterality: Left;  [place] 00:31.5 CDE 3.49 Fluid pack Lot # [id number]  . COLON SURGERY  2004   colon removed  . CTR    . EXTRACORPOREAL SHOCK WAVE LITHOTRIPSY Right 02/26/2015   Procedure: EXTRACORPOREAL SHOCK WAVE LITHOTRIPSY (ESWL);  Surgeon: Collier Flowers, MD;  Location: [facility] ORS;  Service: Urology;  Laterality: Right;  . REDUCTION MAMMAPLASTY Bilateral 2005    Medical History: Past Medical History:  Diagnosis Date  . Anxiety  11/21/2013  . Breath shortness 11/21/2013  . Crohn disease ([place])   . Dizziness 11/21/2013  . Extensor tenosynovitis of wrist 12/24/2013  . Ganglion cyst of wrist 12/24/2013  . Hypotension, postural 11/22/2013  . Psoriasis   . Skin cancer     Family History: Family History  Problem Relation Age of Onset  . Diabetes Father   . Diabetes Mother   . Heart disease Maternal Grandmother   . Breast cancer Neg Hx     Social History   Socioeconomic History  . Marital status: Married     Spouse name: Not on file  . Number of children: Not on file  . Years of education: Not on file  . Highest education level: Not on file  Occupational History  . Not on file  Tobacco Use  . Smoking status: Former Research scientist (life sciences)  . Smokeless tobacco: Never Used  Vaping Use  . Vaping Use: Never used  Substance and Sexual Activity  . Alcohol use: Yes    Comment: occasional drink, once a month  . Drug use: No  . Sexual activity: Yes    Partners: Male  Other Topics Concern  . Not on file  Social History Narrative  . Not on file   Social Determinants of Health   Financial Resource Strain:   . Difficulty of Paying Living Expenses: Not on file  Food Insecurity:   . Worried About Charity fundraiser in the Last Year: Not on file  . Ran Out of Food in the Last Year: Not on file  Transportation Needs:   . Lack of Transportation (Medical): Not on file  . Lack of Transportation (Non-Medical): Not on file  Physical Activity:   . Days of Exercise per Week: Not on file  . Minutes of Exercise per Session: Not on file  Stress:   . Feeling of Stress : Not on file  Social Connections:   . Frequency of Communication with Friends and Family: Not on file  . Frequency of Social Gatherings with Friends and Family: Not on file  . Attends Religious Services: Not on file  . Active Member of Clubs or Organizations: Not on file  . Attends Archivist Meetings: Not on file  . Marital Status: Not on file  Intimate Partner Violence:   . Fear of Current or Ex-Partner: Not on file  . Emotionally Abused: Not on file  . Physically Abused: Not on file  . Sexually Abused: Not on file   Review of Systems  Constitutional: Positive for fatigue. Negative for chills, diaphoresis and fever.  Respiratory: Negative for cough, shortness of breath and wheezing.   Cardiovascular: Negative for chest pain, palpitations and leg swelling.  Gastrointestinal: Positive for abdominal distention. Negative for  abdominal pain, constipation, diarrhea, nausea and vomiting.       Heart burn   Skin: Negative for color change.  Neurological: Negative for dizziness and headaches.  Hematological: Does not bruise/bleed easily.  Psychiatric/Behavioral: Positive for sleep disturbance. Negative for agitation, behavioral problems (depression) and hallucinations.    Vital Signs: Ht 5' 8.5" (1.74 m)   Wt 182 lb (82.6 kg)   SpO2 97%   BMI 27.27 kg/m    Observation/Objective: Able to communicate well, however looks tired.   Assessment/Plan: 1. Pneumonia due to COVID-19 virus - Improving, will continue to monitor, will need f/u CXR for resolution   2. Gastroesophageal reflux disease without esophagitis - will take prilosec otc at home ( side effects of decadron)  3. Insomnia due to medical condition - Does not want to try anything new however was educated on side effects of steroids and she has one more day left   4. Other fatigue - Encouraged her to rest with light walking at home, increase po intake of fluids   General Counseling: Carynn verbalizes understanding of the findings of today's virtual visitvisit and agrees with plan of treatment. I have discussed any further diagnostic evaluation that may be needed or ordered today. We also reviewed her medications today. she has been encouraged to call the office with any questions or concerns that should arise related to todays visit. All medical records reviewed and appropriate follow up will be scheduled and symptoms will be monitored    Time spent:30 Minutes    Dr Lavera Guise Internal medicine

## 2019-12-23 ENCOUNTER — Other Ambulatory Visit: Payer: Self-pay

## 2019-12-23 DIAGNOSIS — E559 Vitamin D deficiency, unspecified: Secondary | ICD-10-CM

## 2019-12-30 ENCOUNTER — Telehealth: Payer: Self-pay

## 2019-12-30 NOTE — Telephone Encounter (Signed)
Confirmed and screened for 12-31-19 ov. 

## 2019-12-31 ENCOUNTER — Other Ambulatory Visit: Payer: Self-pay

## 2019-12-31 ENCOUNTER — Ambulatory Visit: Payer: 59 | Admitting: Internal Medicine

## 2019-12-31 ENCOUNTER — Encounter: Payer: Self-pay | Admitting: Internal Medicine

## 2019-12-31 VITALS — BP 139/73 | HR 79 | Temp 97.2°F | Resp 16 | Ht 69.0 in | Wt 189.4 lb

## 2019-12-31 DIAGNOSIS — R600 Localized edema: Secondary | ICD-10-CM

## 2019-12-31 DIAGNOSIS — U071 COVID-19: Secondary | ICD-10-CM | POA: Diagnosis not present

## 2019-12-31 DIAGNOSIS — J1282 Pneumonia due to coronavirus disease 2019: Secondary | ICD-10-CM

## 2019-12-31 DIAGNOSIS — R21 Rash and other nonspecific skin eruption: Secondary | ICD-10-CM | POA: Diagnosis not present

## 2019-12-31 MED ORDER — FUROSEMIDE 20 MG PO TABS: 20.0000 mg | ORAL_TABLET | Freq: Every day | ORAL | 3 refills | Status: DC

## 2019-12-31 NOTE — Progress Notes (Signed)
[facility] [place], [place] [address]  Internal MEDICINE  Office Visit Note  Patient Name: Ruth Morales  [id number]  [id number]  Date of Service: 01/02/2020  Chief Complaint  Patient presents with   Follow-up    left ankle swelling and toes tingle or feel numb   Quality Metric Gaps    HepC, Tdap, covid vaccine, flu shot    HPI Pt is coming in for routine follow up. 1. She was recently hospitalized for COVID pneumonia, was given steroids to go home, c/o weight gain and swelling. Breathing is improved. 2. Noticed rash moe like macules on her body, arms and legs, does not itch but she is concerned about it. Most of it looks healed and scaly. 3. Takes all medications as prescribed   Current Medication: Outpatient Encounter Medications as of 12/31/2019  Medication Sig   ascorbic acid (VITAMIN C) 500 MG tablet Take 1 tablet (500 mg total) by mouth daily.   Calcium Citrate-Vitamin D (CALCIUM CITRATE + D3 MAXIMUM) 315-250 MG-UNIT TABS Take by mouth.   cyanocobalamin (,VITAMIN B-12,) 1000 MCG/ML injection Inject 1,000 mcg into the muscle once.   estradiol (ESTRACE) 1 MG tablet Take 1 mg by mouth daily.   ibuprofen (ADVIL,MOTRIN) 200 MG tablet Take 600 mg by mouth every 6 (six) hours as needed for headache or moderate pain.    loperamide (IMODIUM) 2 MG capsule Take 4 mg by mouth 2 (two) times daily.    Multiple Vitamin (MULTIVITAMIN WITH MINERALS) TABS tablet Take 1 tablet by mouth daily.   progesterone (PROMETRIUM) 100 MG capsule Take 1 capsule (100 mg total) by mouth daily.   promethazine (PHENERGAN) 12.5 MG tablet Take 1 tablet (12.5 mg total) by mouth every 8 (eight) hours as needed for nausea or vomiting.   Vitamin D, Ergocalciferol, (DRISDOL) 1.25 MG (50000 UT) CAPS capsule Take 1 capsule (50,000 Units total) by mouth every 7 (seven) days.   zinc sulfate 220 (50 Zn) MG capsule Take 1 capsule (220 mg total) by mouth daily.   furosemide (LASIX) 20  MG tablet Take 1 tablet (20 mg total) by mouth daily.   No facility-administered encounter medications on file as of 12/31/2019.    Surgical History: Past Surgical History:  Procedure Laterality Date   BREAST BIOPSY Left 2003   needle bx-neg   BREAST SURGERY Bilateral 2004   breast reduction   CATARACT EXTRACTION W/PHACO Left 02/06/2018   Procedure: CATARACT EXTRACTION PHACO AND INTRAOCULAR LENS PLACEMENT ([place]);  Surgeon: Birder Robson, MD;  Location: [facility] ORS;  Service: Ophthalmology;  Laterality: Left;  [place] 00:31.5 CDE 3.49 Fluid pack Lot # [id number]   COLON SURGERY  2004   colon removed   CTR     EXTRACORPOREAL SHOCK WAVE LITHOTRIPSY Right 02/26/2015   Procedure: EXTRACORPOREAL SHOCK WAVE LITHOTRIPSY (ESWL);  Surgeon: Collier Flowers, MD;  Location: [facility] ORS;  Service: Urology;  Laterality: Right;   REDUCTION MAMMAPLASTY Bilateral 2005    Medical History: Past Medical History:  Diagnosis Date   Anxiety 11/21/2013   Breath shortness 11/21/2013   Crohn disease ([place])    Dizziness 11/21/2013   Extensor tenosynovitis of wrist 12/24/2013   Ganglion cyst of wrist 12/24/2013   Hypotension, postural 11/22/2013   Psoriasis    Skin cancer     Family History: Family History  Problem Relation Age of Onset   Diabetes Father    Diabetes Mother    Heart disease Maternal Grandmother    Breast cancer Neg Hx  Social History   Socioeconomic History   Marital status: Married    Spouse name: Not on file   Number of children: Not on file   Years of education: Not on file   Highest education level: Not on file  Occupational History   Not on file  Tobacco Use   Smoking status: Former Smoker   Smokeless tobacco: Never Used  Vaping Use   Vaping Use: Never used  Substance and Sexual Activity   Alcohol use: Yes    Alcohol/week: 1.0 standard drink    Types: 1 Standard drinks or equivalent per week    Comment: occasional drink, once a month   Drug  use: No   Sexual activity: Yes    Partners: Male  Other Topics Concern   Not on file  Social History Narrative   Not on file   Social Determinants of Health   Financial Resource Strain:    Difficulty of Paying Living Expenses: Not on file  Food Insecurity:    Worried About Charity fundraiser in the Last Year: Not on file   [employer] of Food in the Last Year: Not on file  Transportation Needs:    Lack of Transportation (Medical): Not on file   Lack of Transportation (Non-Medical): Not on file  Physical Activity:    Days of Exercise per Week: Not on file   Minutes of Exercise per Session: Not on file  Stress:    Feeling of Stress : Not on file  Social Connections:    Frequency of Communication with Friends and Family: Not on file   Frequency of Social Gatherings with Friends and Family: Not on file   Attends Religious Services: Not on file   Active Member of Clubs or Organizations: Not on file   Attends Archivist Meetings: Not on file   Marital Status: Not on file  Intimate Partner Violence:    Fear of Current or Ex-Partner: Not on file   Emotionally Abused: Not on file   Physically Abused: Not on file   Sexually Abused: Not on file      Review of Systems  Constitutional: Negative for chills, diaphoresis and fatigue.  HENT: Negative for ear pain, postnasal drip and sinus pressure.   Eyes: Negative for photophobia, discharge, redness, itching and visual disturbance.  Respiratory: Negative for cough and wheezing.   Cardiovascular: Positive for leg swelling. Negative for chest pain and palpitations.  Gastrointestinal: Negative for abdominal pain, constipation, diarrhea, nausea and vomiting.  Genitourinary: Negative for dysuria and flank pain.  Musculoskeletal: Negative for arthralgias, back pain, gait problem and neck pain.  Skin: Positive for rash. Negative for color change.  Allergic/Immunologic: Negative for environmental allergies and  food allergies.  Neurological: Positive for numbness. Negative for dizziness and headaches.  Hematological: Does not bruise/bleed easily.  Psychiatric/Behavioral: Negative for agitation, behavioral problems (depression) and hallucinations.    Vital Signs: BP 139/73    Pulse 79    Temp (!) 97.2 F (36.2 C)    Resp 16    Ht _0  (1.753 m)    Wt 189 lb 6.4 oz (85.9 kg)    SpO2 99%    BMI 27.97 kg/m    Physical Exam Constitutional:      Appearance: She is obese.  HENT:     Head: Atraumatic.  Eyes:     Extraocular Movements: Extraocular movements intact.     Pupils: Pupils are equal, round, and reactive to light.  Cardiovascular:  Rate and Rhythm: Normal rate and regular rhythm.     Pulses: Normal pulses.     Heart sounds: Normal heart sounds.  Pulmonary:     Effort: Pulmonary effort is normal. No respiratory distress.     Breath sounds: No wheezing.     Comments: Reduced air entry with coarse ronchi Skin:    Findings: Lesion and rash present.  Neurological:     General: No focal deficit present.     Mental Status: She is oriented to person, place, and time.  Psychiatric:        Mood and Affect: Mood normal.    Assessment/Plan: 1. Pneumonia due to COVID-19 virus Follow up CXR to see resolution of pneumonia, might need PFT - DG Chest 2 View; Future - Comprehensive metabolic panel  2. Rash associated with COVID-19 resolving at this time, will monitor closely  - CBC w/Diff/Platelet - Sed Rate (ESR) - ANA w/Reflex if Positive; Future - D-Dimer, Quantitative - Fe+TIBC+Fer  3. Localized edema Pt seems to be in mild fluid overload on exam, will treat with low dose lasix and monitor electrolytes  - furosemide (LASIX) 20 MG tablet; Take 1 tablet (20 mg total) by mouth daily.  Dispense: 15 tablet; Refill: 3  General Counseling: Suhani verbalizes understanding of the findings of todays visit and agrees with plan of treatment. I have discussed any further diagnostic evaluation  that may be needed or ordered today. We also reviewed her medications today. she has been encouraged to call the office with any questions or concerns that should arise related to todays visit.  Orders Placed This Encounter  Procedures   DG Chest 2 View   CBC w/Diff/Platelet   Sed Rate (ESR)   ANA w/Reflex if Positive   D-Dimer, Quantitative   Fe+TIBC+Fer   Comprehensive metabolic panel    Meds ordered this encounter  Medications   furosemide (LASIX) 20 MG tablet    Sig: Take 1 tablet (20 mg total) by mouth daily.    Dispense:  15 tablet    Refill:  3    Total time spent:30 Minutes Time spent includes review of chart, medications, test results, and follow up plan with the patient.      Dr Lavera Guise Internal medicine

## 2020-01-01 ENCOUNTER — Other Ambulatory Visit: Payer: Self-pay

## 2020-01-01 ENCOUNTER — Ambulatory Visit: Payer: 59 | Admitting: Internal Medicine

## 2020-01-01 ENCOUNTER — Ambulatory Visit
Admission: RE | Admit: 2020-01-01 | Discharge: 2020-01-01 | Disposition: A | Discharge status: Home / Self Care | Payer: 59 | Source: Ambulatory Visit | Attending: Internal Medicine | Admitting: Internal Medicine

## 2020-01-01 ENCOUNTER — Ambulatory Visit
Admission: RE | Admit: 2020-01-01 | Discharge: 2020-01-01 | Disposition: A | Discharge status: Home / Self Care | Payer: 59 | Attending: Internal Medicine | Admitting: Internal Medicine

## 2020-01-01 DIAGNOSIS — U071 COVID-19: Secondary | ICD-10-CM | POA: Insufficient documentation

## 2020-01-01 DIAGNOSIS — R21 Rash and other nonspecific skin eruption: Secondary | ICD-10-CM | POA: Insufficient documentation

## 2020-01-01 DIAGNOSIS — J1282 Pneumonia due to coronavirus disease 2019: Secondary | ICD-10-CM

## 2020-01-03 ENCOUNTER — Other Ambulatory Visit
Admission: RE | Admit: 2020-01-03 | Discharge: 2020-01-03 | Disposition: A | Discharge status: Home / Self Care | Payer: 59 | Attending: Internal Medicine | Admitting: Internal Medicine

## 2020-01-03 DIAGNOSIS — U071 COVID-19: Secondary | ICD-10-CM | POA: Insufficient documentation

## 2020-01-03 DIAGNOSIS — R21 Rash and other nonspecific skin eruption: Secondary | ICD-10-CM | POA: Diagnosis present

## 2020-01-03 LAB — CBC WITH DIFFERENTIAL/PLATELET
Abs Immature Granulocytes: 0.02 10*3/uL (ref 0.00–0.07)
Basophils Absolute: 0 10*3/uL (ref 0.0–0.1)
Basophils Relative: 1 %
Eosinophils Absolute: 0.2 10*3/uL (ref 0.0–0.5)
Eosinophils Relative: 6 %
HCT: 36 % (ref 36.0–46.0)
Hemoglobin: 11.7 g/dL — ABNORMAL LOW (ref 12.0–15.0)
Immature Granulocytes: 1 %
Lymphocytes Relative: 27 %
Lymphs Abs: 1.1 10*3/uL (ref 0.7–4.0)
MCH: 30.5 pg (ref 26.0–34.0)
MCHC: 32.5 g/dL (ref 30.0–36.0)
MCV: 94 fL (ref 80.0–100.0)
Monocytes Absolute: 0.5 10*3/uL (ref 0.1–1.0)
Monocytes Relative: 11 %
Neutro Abs: 2.2 10*3/uL (ref 1.7–7.7)
Neutrophils Relative %: 54 %
Platelets: 373 10*3/uL (ref 150–400)
RBC: 3.83 MIL/uL — ABNORMAL LOW (ref 3.87–5.11)
RDW: 13.6 % (ref 11.5–15.5)
WBC: 4.1 10*3/uL (ref 4.0–10.5)
nRBC: 0 % (ref 0.0–0.2)

## 2020-01-03 LAB — COMPREHENSIVE METABOLIC PANEL
ALT: 19 U/L (ref 0–44)
AST: 23 U/L (ref 15–41)
Albumin: 4 g/dL (ref 3.5–5.0)
Alkaline Phosphatase: 65 U/L (ref 38–126)
Anion gap: 11 (ref 5–15)
BUN: 19 mg/dL (ref 6–20)
CO2: 27 mmol/L (ref 22–32)
Calcium: 9.9 mg/dL (ref 8.9–10.3)
Chloride: 102 mmol/L (ref 98–111)
Creatinine, Ser: 0.87 mg/dL (ref 0.44–1.00)
GFR calc Af Amer: >60 mL/min (ref >60)
GFR calc non Af Amer: >60 mL/min (ref >60)
Glucose, Bld: 92 mg/dL (ref 70–99)
Potassium: 4 mmol/L (ref 3.5–5.1)
Sodium: 140 mmol/L (ref 135–145)
Total Bilirubin: 0.5 mg/dL (ref 0.3–1.2)
Total Protein: 7.4 g/dL (ref 6.5–8.1)

## 2020-01-03 LAB — FIBRIN DERIVATIVES D-DIMER (ARMC ONLY): Fibrin derivatives D-dimer (ARMC): 1617.03 ng/mL (FEU) — ABNORMAL HIGH (ref 0.00–499.00)

## 2020-01-03 LAB — IRON AND TIBC
Iron: 88 ug/dL (ref 28–170)
Saturation Ratios: 28 % (ref 10.4–31.8)
TIBC: 316 ug/dL (ref 250–450)
UIBC: 228 ug/dL

## 2020-01-03 LAB — FERRITIN: Ferritin: 256 ng/mL (ref 11–307)

## 2020-01-03 LAB — SEDIMENTATION RATE: Sed Rate: 57 mm/hr — ABNORMAL HIGH (ref 0–30)

## 2020-01-03 NOTE — Addendum Note (Signed)
Addended by: Elmo Putt on: 01/03/2020 08:05 AM   Modules accepted: Orders

## 2020-01-03 NOTE — Progress Notes (Signed)
Labs reviewed, awaiting ANA

## 2020-01-04 LAB — ANA W/REFLEX IF POSITIVE: Anti Nuclear Antibody (ANA): NEGATIVE

## 2020-01-07 ENCOUNTER — Other Ambulatory Visit: Payer: Self-pay | Admitting: Internal Medicine

## 2020-01-07 ENCOUNTER — Encounter: Payer: Self-pay | Admitting: Internal Medicine

## 2020-01-07 DIAGNOSIS — R600 Localized edema: Secondary | ICD-10-CM

## 2020-01-08 ENCOUNTER — Other Ambulatory Visit: Payer: Self-pay | Admitting: Internal Medicine

## 2020-01-08 DIAGNOSIS — U071 COVID-19: Secondary | ICD-10-CM

## 2020-01-08 DIAGNOSIS — R600 Localized edema: Secondary | ICD-10-CM

## 2020-01-08 DIAGNOSIS — Z0189 Encounter for other specified special examinations: Secondary | ICD-10-CM

## 2020-01-10 ENCOUNTER — Other Ambulatory Visit: Payer: Self-pay

## 2020-01-10 ENCOUNTER — Ambulatory Visit: Payer: 59

## 2020-01-10 DIAGNOSIS — R2243 Localized swelling, mass and lump, lower limb, bilateral: Secondary | ICD-10-CM

## 2020-01-10 DIAGNOSIS — R600 Localized edema: Secondary | ICD-10-CM

## 2020-01-16 ENCOUNTER — Other Ambulatory Visit
Admission: RE | Admit: 2020-01-16 | Discharge: 2020-01-16 | Disposition: A | Discharge status: Home / Self Care | Payer: 59 | Attending: Internal Medicine | Admitting: Internal Medicine

## 2020-01-16 ENCOUNTER — Ambulatory Visit (INDEPENDENT_AMBULATORY_CARE_PROVIDER_SITE_OTHER): Payer: 59 | Admitting: Internal Medicine

## 2020-01-16 ENCOUNTER — Encounter: Payer: Self-pay | Admitting: Internal Medicine

## 2020-01-16 VITALS — BP 126/74 | HR 76 | Temp 98.3°F | Ht 69.0 in | Wt 187.0 lb

## 2020-01-16 DIAGNOSIS — Z5181 Encounter for therapeutic drug level monitoring: Secondary | ICD-10-CM | POA: Insufficient documentation

## 2020-01-16 DIAGNOSIS — Z8616 Personal history of COVID-19: Secondary | ICD-10-CM | POA: Insufficient documentation

## 2020-01-16 DIAGNOSIS — R7989 Other specified abnormal findings of blood chemistry: Secondary | ICD-10-CM

## 2020-01-16 DIAGNOSIS — G479 Sleep disorder, unspecified: Secondary | ICD-10-CM

## 2020-01-16 DIAGNOSIS — J1282 Pneumonia due to coronavirus disease 2019: Secondary | ICD-10-CM

## 2020-01-16 DIAGNOSIS — R600 Localized edema: Secondary | ICD-10-CM | POA: Diagnosis not present

## 2020-01-16 DIAGNOSIS — U071 COVID-19: Secondary | ICD-10-CM | POA: Diagnosis not present

## 2020-01-16 DIAGNOSIS — Z79899 Other long term (current) drug therapy: Secondary | ICD-10-CM | POA: Insufficient documentation

## 2020-01-16 LAB — BASIC METABOLIC PANEL
Anion gap: 10 (ref 5–15)
BUN: 15 mg/dL (ref 6–20)
CO2: 27 mmol/L (ref 22–32)
Calcium: 9.6 mg/dL (ref 8.9–10.3)
Chloride: 101 mmol/L (ref 98–111)
Creatinine, Ser: 0.85 mg/dL (ref 0.44–1.00)
GFR calc non Af Amer: >60 mL/min (ref >60)
Glucose, Bld: 96 mg/dL (ref 70–99)
Potassium: 3.9 mmol/L (ref 3.5–5.1)
Sodium: 138 mmol/L (ref 135–145)

## 2020-01-16 LAB — FIBRIN DERIVATIVES D-DIMER (ARMC ONLY): Fibrin derivatives D-dimer (ARMC): 1189.43 ng/mL (FEU) — ABNORMAL HIGH (ref 0.00–499.00)

## 2020-01-16 NOTE — Progress Notes (Signed)
[facility] [place], [place] [address]  Internal MEDICINE  Office Visit Note  Patient Name: Ruth Morales  [id number]  [id number]  Date of Service: 01/20/2020  Chief Complaint  Patient presents with  . Follow-up    ultrasound  . Quality Metric Gaps    Hep c screen, Tdap, flu vaccine  . controlled substance form    acknowledged    HPI Pt is here for f/u. Pt had covid pneumonia and was hospitalized, Her D -Dimer was exteremly elevated >1500, she had lower ext edema and venous dopplers were ordered, which are negative.She was also started on Lasix, feels well.overall. she is taking ASA. Also on HRT ( which might need re-evaluation.  Pt will need to have a follow up study for her Lungs ( CXR or CT lungs). Her rash is improving as well. She is having problem sleeping, has been snoring and getting up in the middle of nigh. Pt does have some swelling of lower ext as well   Current Medication: Outpatient Encounter Medications as of 01/16/2020  Medication Sig  . Calcium Citrate-Vitamin D (CALCIUM CITRATE + D3 MAXIMUM) 315-250 MG-UNIT TABS Take by mouth.  . cyanocobalamin (,VITAMIN B-12,) 1000 MCG/ML injection Inject 1,000 mcg into the muscle once.  Marland Kitchen estradiol (ESTRACE) 1 MG tablet Take 1 mg by mouth daily.  . furosemide (LASIX) 20 MG tablet TAKE 1 TABLET BY MOUTH EVERY DAY  . ibuprofen (ADVIL,MOTRIN) 200 MG tablet Take 600 mg by mouth every 6 (six) hours as needed for headache or moderate pain.   Marland Kitchen loperamide (IMODIUM) 2 MG capsule Take 4 mg by mouth 2 (two) times daily.   . progesterone (PROMETRIUM) 100 MG capsule Take 1 capsule (100 mg total) by mouth daily.  . promethazine (PHENERGAN) 12.5 MG tablet Take 1 tablet (12.5 mg total) by mouth every 8 (eight) hours as needed for nausea or vomiting.  . Vitamin D, Ergocalciferol, (DRISDOL) 1.25 MG (50000 UT) CAPS capsule Take 1 capsule (50,000 Units total) by mouth every 7 (seven) days.   No facility-administered  encounter medications on file as of 01/16/2020.    Surgical History: Past Surgical History:  Procedure Laterality Date  . BREAST BIOPSY Left 2003   needle bx-neg  . BREAST SURGERY Bilateral 2004   breast reduction  . CATARACT EXTRACTION W/PHACO Left 02/06/2018   Procedure: CATARACT EXTRACTION PHACO AND INTRAOCULAR LENS PLACEMENT (IOC);  Surgeon: Birder Robson, MD;  Location: [facility] ORS;  Service: Ophthalmology;  Laterality: Left;  [place] 00:31.5 CDE 3.49 Fluid pack Lot # [id number]  . COLON SURGERY  2004   colon removed  . CTR    . EXTRACORPOREAL SHOCK WAVE LITHOTRIPSY Right 02/26/2015   Procedure: EXTRACORPOREAL SHOCK WAVE LITHOTRIPSY (ESWL);  Surgeon: Collier Flowers, MD;  Location: [facility] ORS;  Service: Urology;  Laterality: Right;  . REDUCTION MAMMAPLASTY Bilateral 2005    Medical History: Past Medical History:  Diagnosis Date  . Anxiety 11/21/2013  . Breath shortness 11/21/2013  . Crohn disease ([place])   . Dizziness 11/21/2013  . Extensor tenosynovitis of wrist 12/24/2013  . Ganglion cyst of wrist 12/24/2013  . Hypotension, postural 11/22/2013  . Psoriasis   . Skin cancer     Family History: Family History  Problem Relation Age of Onset  . Diabetes Father   . Diabetes Mother   . Heart disease Maternal Grandmother   . Breast cancer Neg Hx     Social History   Socioeconomic History  . Marital status: Married  Spouse name: Not on file  . Number of children: Not on file  . Years of education: Not on file  . Highest education level: Not on file  Occupational History  . Not on file  Tobacco Use  . Smoking status: Former Research scientist (life sciences)  . Smokeless tobacco: Never Used  Vaping Use  . Vaping Use: Never used  Substance and Sexual Activity  . Alcohol use: Yes    Alcohol/week: 1.0 standard drink    Types: 1 Standard drinks or equivalent per week    Comment: occasional drink, once a month  . Drug use: No  . Sexual activity: Yes    Partners: Male  Other Topics Concern  . Not on  file  Social History Narrative  . Not on file   Social Determinants of Health   Financial Resource Strain:   . Difficulty of Paying Living Expenses: Not on file  Food Insecurity:   . Worried About Charity fundraiser in the Last Year: Not on file  . Ran Out of Food in the Last Year: Not on file  Transportation Needs:   . Lack of Transportation (Medical): Not on file  . Lack of Transportation (Non-Medical): Not on file  Physical Activity:   . Days of Exercise per Week: Not on file  . Minutes of Exercise per Session: Not on file  Stress:   . Feeling of Stress : Not on file  Social Connections:   . Frequency of Communication with Friends and Family: Not on file  . Frequency of Social Gatherings with Friends and Family: Not on file  . Attends Religious Services: Not on file  . Active Member of Clubs or Organizations: Not on file  . Attends Archivist Meetings: Not on file  . Marital Status: Not on file  Intimate Partner Violence:   . Fear of Current or Ex-Partner: Not on file  . Emotionally Abused: Not on file  . Physically Abused: Not on file  . Sexually Abused: Not on file      Review of Systems  Constitutional: Negative for chills, diaphoresis and fatigue.  HENT: Negative for ear pain, postnasal drip and sinus pressure.   Eyes: Negative for photophobia, discharge, redness, itching and visual disturbance.  Respiratory: Negative for cough, shortness of breath and wheezing.   Cardiovascular: Positive for leg swelling. Negative for chest pain and palpitations.  Gastrointestinal: Negative for abdominal pain, constipation, diarrhea, nausea and vomiting.  Genitourinary: Negative for dysuria and flank pain.  Musculoskeletal: Negative for arthralgias, back pain, gait problem and neck pain.  Skin: Positive for rash. Negative for color change.  Allergic/Immunologic: Negative for environmental allergies and food allergies.  Neurological: Negative for dizziness and  headaches.  Hematological: Does not bruise/bleed easily.  Psychiatric/Behavioral: Negative for agitation, behavioral problems (depression) and hallucinations.    Vital Signs: BP 126/74   Pulse 76   Temp 98.3 F (36.8 C)   Ht 5\' 9"  (1.753 m)   Wt 187 lb (84.8 kg)   SpO2 98%   BMI 27.62 kg/m    Physical Exam Constitutional:      General: She is not in acute distress.    Appearance: She is well-developed. She is not diaphoretic.  HENT:     Head: Normocephalic and atraumatic.     Mouth/Throat:     Pharynx: No oropharyngeal exudate.  Eyes:     Extraocular Movements: Extraocular movements intact.     Pupils: Pupils are equal, round, and reactive to light.  Neck:  Thyroid: No thyromegaly.     Vascular: No JVD.     Trachea: No tracheal deviation.  Cardiovascular:     Rate and Rhythm: Normal rate and regular rhythm.     Heart sounds: Normal heart sounds. No murmur heard.  No friction rub. No gallop.   Pulmonary:     Effort: Pulmonary effort is normal. No respiratory distress.     Breath sounds: No wheezing or rales.  Chest:     Chest wall: No tenderness.  Abdominal:     General: Bowel sounds are normal.     Palpations: Abdomen is soft.  Musculoskeletal:        General: Normal range of motion.     Cervical back: Normal range of motion and neck supple.     Right lower leg: Edema present.     Left lower leg: Edema present.  Lymphadenopathy:     Cervical: No cervical adenopathy.  Skin:    General: Skin is warm and dry.     Findings: Rash present.  Neurological:     Mental Status: She is alert and oriented to person, place, and time.     Cranial Nerves: No cranial nerve deficit.  Psychiatric:        Behavior: Behavior normal.        Thought Content: Thought content normal.        Judgment: Judgment normal.    Assessment/Plan: 1. Pneumonia due to COVID-19 virus Will continue to monitor her improvement, will repeat CT chest in 2 months if continues to have swelling   - D-Dimer, Quantitative - Basic metabolic panel - CT Chest Wo Contrast; Future - PSG Sleep Study; Future  2. Localized edema Continue Lasix, check basic metabolic panel might need echo in future f problem exists  - CT Chest Wo Contrast; Future - PSG Sleep Study; Future  3. Sleep disturbance Patient has sign and symptoms of OSA ( disturbed sleep, excessive fatigue during the day, abnormal BMI). Baseline sleep study is ordered to further look into this. Long term complications of OSA was addressed with the patient. - PSG Sleep Study; Future  4. Positive D dimer Will continue to monitor, will hold on anticoagulation except ASA - CT Chest Wo Contrast; Future   General Counseling: Shelbee verbalizes understanding of the findings of todays visit and agrees with plan of treatment. I have discussed any further diagnostic evaluation that may be needed or ordered today. We also reviewed her medications today. she has been encouraged to call the office with any questions or concerns that should arise related to todays visit.  Orders Placed This Encounter  Procedures  . CT Chest Wo Contrast  . D-Dimer, Quantitative  . Basic metabolic panel  . PSG Sleep Study    Total time spent:35 Minutes Time spent includes review of chart, medications, test results, and follow up plan with the patient.   Dr Lavera Guise Internal medicine

## 2020-01-16 NOTE — Progress Notes (Signed)
Pt can continue Lasix ever yother day and continue asa 325 mg everyday until seen. Her D-dimer level is coming down

## 2020-02-25 ENCOUNTER — Encounter: Payer: 59 | Admitting: Nurse Practitioner

## 2020-03-01 ENCOUNTER — Ambulatory Visit
Admission: EM | Admit: 2020-03-01 | Discharge: 2020-03-01 | Disposition: A | Discharge status: Home / Self Care | Payer: 59 | Attending: Family Medicine | Admitting: Family Medicine

## 2020-03-01 ENCOUNTER — Other Ambulatory Visit: Payer: Self-pay

## 2020-03-01 DIAGNOSIS — J069 Acute upper respiratory infection, unspecified: Secondary | ICD-10-CM | POA: Diagnosis not present

## 2020-03-01 MED ORDER — IPRATROPIUM BROMIDE 0.06 % NA SOLN: 2.0000 | Freq: Four times a day (QID) | NASAL | 0 refills | Status: DC | PRN

## 2020-03-01 MED ORDER — BENZONATATE 200 MG PO CAPS: 200.0000 mg | ORAL_CAPSULE | Freq: Three times a day (TID) | ORAL | 0 refills | Status: DC | PRN

## 2020-03-01 MED ORDER — DOXYCYCLINE HYCLATE 100 MG PO CAPS: 100.0000 mg | ORAL_CAPSULE | Freq: Two times a day (BID) | ORAL | 0 refills | Status: DC

## 2020-03-01 NOTE — ED Provider Notes (Signed)
[facility]    CSN: [id number] Arrival date & time: 03/01/20  9449      History   Chief Complaint Chief Complaint  Patient presents with  . Nasal Congestion   HPI  59 year old female presents with respiratory symptoms.  Started on Friday.  She reports congestion, headache, sinus pain and pressure.  She reports cough as well.  She reports thick green nasal discharge and discolored sputum.  Husband has recently been sick.  She has had COVID-19 recently and was hospitalized.  No fever.  She feels very poorly.  No relieving factors.  Pain 5/10 in severity.  No other complaints.  Past Medical History:  Diagnosis Date  . Anxiety 11/21/2013  . Breath shortness 11/21/2013  . Crohn disease ([place])   . Dizziness 11/21/2013  . Extensor tenosynovitis of wrist 12/24/2013  . Ganglion cyst of wrist 12/24/2013  . Hypotension, postural 11/22/2013  . Psoriasis   . Skin cancer     Patient Active Problem List   Diagnosis Date Noted  . Pneumonia due to COVID-19 virus 12/04/2019  . Acute respiratory failure due to COVID-19 ([place]) 12/04/2019  . Other fatigue 03/24/2019  . Benign essential HTN 02/28/2018  . Screening for breast cancer 12/25/2017  . History of ulcerative colitis with history of colectomy 2004 12/25/2017  . Screening for malignant neoplasm of cervix 12/25/2017  . Ovarian failure 12/25/2017  . Psoriasis 12/25/2017  . Pelvic pain 08/21/2017  . Dysuria 08/02/2017  . Urinary tract infection with hematuria 08/02/2017  . Menopausal and perimenopausal disorder 05/26/2017  . BMI 29.0-29.9,adult 04/14/2017  . Candidiasis of vulva and vagina 04/14/2017  . Mixed hyperlipidemia 04/14/2017  . Syncope and collapse 04/14/2017  . Low back pain 04/14/2017  . Vitamin D deficiency 04/14/2017  . Vitamin B12 deficiency anemia, unspecified 04/14/2017  . B12 deficiency 04/14/2017  . Vascular headache, not elsewhere classified 04/14/2017  . Extensor tenosynovitis of wrist 12/24/2013  .  Ganglion cyst of wrist 12/24/2013  . Hypotension, postural 11/22/2013  . Anxiety 11/21/2013  . Dizziness 11/21/2013  . Breath shortness 11/21/2013    Past Surgical History:  Procedure Laterality Date  . BREAST BIOPSY Left 2003   needle bx-neg  . BREAST SURGERY Bilateral 2004   breast reduction  . CATARACT EXTRACTION W/PHACO Left 02/06/2018   Procedure: CATARACT EXTRACTION PHACO AND INTRAOCULAR LENS PLACEMENT (IOC);  Surgeon: Birder Robson, MD;  Location: [facility] ORS;  Service: Ophthalmology;  Laterality: Left;  [place] 00:31.5 CDE 3.49 Fluid pack Lot # [id number]  . COLON SURGERY  2004   colon removed  . CTR    . EXTRACORPOREAL SHOCK WAVE LITHOTRIPSY Right 02/26/2015   Procedure: EXTRACORPOREAL SHOCK WAVE LITHOTRIPSY (ESWL);  Surgeon: Collier Flowers, MD;  Location: [facility] ORS;  Service: Urology;  Laterality: Right;  . REDUCTION MAMMAPLASTY Bilateral 2005    OB History   No obstetric history on file.      Home Medications    Prior to Admission medications   Medication Sig Start Date End Date Taking? Authorizing Provider  Calcium Citrate-Vitamin D (CALCIUM CITRATE + D3 MAXIMUM) 315-250 MG-UNIT TABS Take by mouth.   Yes [provider]  furosemide (LASIX) 20 MG tablet TAKE 1 TABLET BY MOUTH EVERY DAY 01/07/20  Yes Lavera Guise, MD  ibuprofen (ADVIL,MOTRIN) 200 MG tablet Take 600 mg by mouth every 6 (six) hours as needed for headache or moderate pain.    Yes [provider]  loperamide (IMODIUM) 2 MG capsule Take 4 mg by mouth  2 (two) times daily.    Yes [provider]  promethazine (PHENERGAN) 12.5 MG tablet Take 1 tablet (12.5 mg total) by mouth every 8 (eight) hours as needed for nausea or vomiting. 11/28/19  Yes Lavera Guise, MD  Vitamin D, Ergocalciferol, (DRISDOL) 1.25 MG (50000 UT) CAPS capsule Take 1 capsule (50,000 Units total) by mouth every 7 (seven) days. 04/18/19  Yes Boscia, Heather E, NP  benzonatate (TESSALON) 200 MG capsule Take 1 capsule (200  mg total) by mouth 3 (three) times daily as needed for cough. 03/01/20   Coral Spikes, DO  ipratropium (ATROVENT) 0.06 % nasal spray Place 2 sprays into both nostrils 4 (four) times daily as needed (Rhinitis, congestion). 03/01/20   Coral Spikes, DO  progesterone (PROMETRIUM) 100 MG capsule Take 1 capsule (100 mg total) by mouth daily. 04/21/19 03/01/20  Ronnell Freshwater, NP    Family History Family History  Problem Relation Age of Onset  . Diabetes Father   . Diabetes Mother   . Heart disease Maternal Grandmother   . Breast cancer Neg Hx     Social History Social History   Tobacco Use  . Smoking status: Former Research scientist (life sciences)  . Smokeless tobacco: Never Used  Vaping Use  . Vaping Use: Never used  Substance Use Topics  . Alcohol use: Yes    Alcohol/week: 1.0 standard drink    Types: 1 Standard drinks or equivalent per week    Comment: occasional drink, once a month  . Drug use: No     Allergies   Morphine, Penicillins, Macrobid [nitrofurantoin macrocrystal], and Benadryl [diphenhydramine hcl]   Review of Systems Review of Systems Per HPI  Physical Exam Triage Vital Signs ED Triage Vitals  Enc Vitals Group     BP 03/01/20 0854 121/90     Pulse Rate 03/01/20 0854 84     Resp 03/01/20 0854 18     Temp 03/01/20 0854 98.6 F (37 C)     Temp Source 03/01/20 0854 Oral     SpO2 03/01/20 0854 100 %     Weight 03/01/20 0852 188 lb (85.3 kg)     Height 03/01/20 0852 5\' 9"  (1.753 m)     Head Circumference --      Peak Flow --      Pain Score 03/01/20 0851 5     Pain Loc --      Pain Edu? --      Excl. in [place]? --    Updated Vital Signs BP 121/90 (BP Location: Left Arm)   Pulse 84   Temp 98.6 F (37 C) (Oral)   Resp 18   Ht 5\' 9"  (1.753 m)   Wt 85.3 kg   SpO2 100%   BMI 27.76 kg/m   Visual Acuity Right Eye Distance:   Left Eye Distance:   Bilateral Distance:    Right Eye Near:   Left Eye Near:    Bilateral Near:     Physical Exam Constitutional:       General: She is not in acute distress.    Appearance: Normal appearance. She is not ill-appearing.  HENT:     Head: Normocephalic and atraumatic.     Right Ear: Tympanic membrane normal.     Left Ear: Tympanic membrane normal.     Nose: No rhinorrhea.  Eyes:     General:        Right eye: No discharge.        Left eye: No  discharge.     Conjunctiva/sclera: Conjunctivae normal.  Cardiovascular:     Rate and Rhythm: Normal rate and regular rhythm.     Heart sounds: No murmur heard.   Pulmonary:     Effort: Pulmonary effort is normal. No respiratory distress.     Breath sounds: Rales present.  Neurological:     Mental Status: She is alert.  Psychiatric:        Mood and Affect: Mood normal.        Behavior: Behavior normal.     UC Treatments / Results  Labs (all labs ordered are listed, but only abnormal results are displayed) Labs Reviewed - No data to display  EKG   Radiology No results found.  Procedures Procedures (including critical care time)  Medications Ordered in UC Medications - No data to display  Initial Impression / Assessment and Plan / UC Course  I have reviewed the triage vital signs and the nursing notes.  Pertinent labs & imaging results that were available during my care of the patient were reviewed by me and considered in my medical decision making (see chart for details).    59 year old female presents with URI with cough and congestion.  Suspect viral etiology.  Too early to determine whether this is bacterial sinusitis or not.  We discussed this today.  Treating with Atrovent nasal spray, Tessalon Perles.  Wait-and-see prescription given for doxycycline.  Patient is to start this if she fails improve or worsens.  Supportive care.  Final Clinical Impressions(s) / UC Diagnoses   Final diagnoses:  URI with cough and congestion     Discharge Instructions     Likely viral.  Medication as prescribed.  If you worsen, you can start the  antibiotic.  Take care  Dr. Lacinda Axon    ED Prescriptions    Medication Sig Dispense Auth. Provider   ipratropium (ATROVENT) 0.06 % nasal spray Place 2 sprays into both nostrils 4 (four) times daily as needed (Rhinitis, congestion). 15 mL ,  G, DO   benzonatate (TESSALON) 200 MG capsule Take 1 capsule (200 mg total) by mouth 3 (three) times daily as needed for cough. 30 capsule Coral Spikes, DO     PDMP not reviewed this encounter.   Coral Spikes, DO 03/01/20 1009

## 2020-03-01 NOTE — Discharge Instructions (Addendum)
Likely viral.  Medication as prescribed.  If you worsen, you can start the antibiotic.  Take care  Dr. Lacinda Axon

## 2020-03-01 NOTE — ED Triage Notes (Signed)
Patient states that she had nasal congestion, stuffy head, headache, sinus pain and pressure. States that she has been having thick green mucus and a cough. States that in August she had covid with pneumonia. States that husband has been sick recently as well.

## 2020-03-16 ENCOUNTER — Other Ambulatory Visit: Payer: Self-pay | Admitting: Internal Medicine

## 2020-03-16 DIAGNOSIS — Z1231 Encounter for screening mammogram for malignant neoplasm of breast: Secondary | ICD-10-CM

## 2020-03-17 ENCOUNTER — Ambulatory Visit (INDEPENDENT_AMBULATORY_CARE_PROVIDER_SITE_OTHER): Payer: 59 | Admitting: Hospice and Palliative Medicine

## 2020-03-17 ENCOUNTER — Ambulatory Visit
Admission: RE | Admit: 2020-03-17 | Discharge: 2020-03-17 | Disposition: A | Discharge status: Home / Self Care | Payer: 59 | Attending: Hospice and Palliative Medicine | Admitting: Hospice and Palliative Medicine

## 2020-03-17 ENCOUNTER — Encounter: Payer: Self-pay | Admitting: Hospice and Palliative Medicine

## 2020-03-17 ENCOUNTER — Ambulatory Visit
Admission: RE | Admit: 2020-03-17 | Discharge: 2020-03-17 | Disposition: A | Discharge status: Home / Self Care | Payer: 59 | Source: Ambulatory Visit | Attending: Hospice and Palliative Medicine | Admitting: Hospice and Palliative Medicine

## 2020-03-17 ENCOUNTER — Other Ambulatory Visit: Payer: Self-pay

## 2020-03-17 VITALS — BP 120/80 | HR 77 | Temp 97.8°F | Resp 16 | Ht 68.5 in | Wt 193.0 lb

## 2020-03-17 DIAGNOSIS — R0609 Other forms of dyspnea: Secondary | ICD-10-CM

## 2020-03-17 DIAGNOSIS — R7989 Other specified abnormal findings of blood chemistry: Secondary | ICD-10-CM | POA: Diagnosis not present

## 2020-03-17 DIAGNOSIS — R0600 Dyspnea, unspecified: Secondary | ICD-10-CM | POA: Insufficient documentation

## 2020-03-17 DIAGNOSIS — Z0001 Encounter for general adult medical examination with abnormal findings: Secondary | ICD-10-CM | POA: Diagnosis not present

## 2020-03-17 DIAGNOSIS — Z1152 Encounter for screening for COVID-19: Secondary | ICD-10-CM

## 2020-03-17 DIAGNOSIS — R5383 Other fatigue: Secondary | ICD-10-CM

## 2020-03-17 DIAGNOSIS — L409 Psoriasis, unspecified: Secondary | ICD-10-CM

## 2020-03-17 DIAGNOSIS — R300 Dysuria: Secondary | ICD-10-CM

## 2020-03-17 NOTE — Progress Notes (Signed)
[facility] [place], [place] [address]  Internal MEDICINE  Office Visit Note  Patient Name: Ruth Morales  [id number]  [id number]  Date of Service: 03/20/2020  Chief Complaint  Patient presents with  . Annual Exam  . Anxiety     HPI Pt is here for routine health maintenance examination Overall, things have been well -Hospitalized with COVID19 with pneumonia 8/24-8/27 Since discharge she has continued to struggle with fatigue, body aches, muscle weakness and shortness of breath -We have been closely following elevated d-dimer levels since discharge that remain elevated but have started to lower Has continued with ASA therapy but was stopped about 4 weeks ago prior to a tooth extraction that took place yesterday -Has stopped HRT due to elevated d-dimer  12/22 mammogram CT chest denied by insurance Colonoscopy August 2021 Colon removed 2004--due to UC and prolonged steroid use Rash on legs have improved, seen by dermatologist, possible flare of psoriasis Will need follow-up CXR Would like to know her antibody status of COVID19 due to prior infection and considering vaccinations  Current Medication: Outpatient Encounter Medications as of 03/17/2020  Medication Sig  . Calcium Citrate-Vitamin D (CALCIUM CITRATE + D3 MAXIMUM) 315-250 MG-UNIT TABS Take by mouth.  . furosemide (LASIX) 20 MG tablet TAKE 1 TABLET BY MOUTH EVERY DAY  . ibuprofen (ADVIL,MOTRIN) 200 MG tablet Take 600 mg by mouth every 6 (six) hours as needed for headache or moderate pain.   Marland Kitchen loperamide (IMODIUM) 2 MG capsule Take 4 mg by mouth 2 (two) times daily.   . promethazine (PHENERGAN) 12.5 MG tablet Take 1 tablet (12.5 mg total) by mouth every 8 (eight) hours as needed for nausea or vomiting.  . Vitamin D, Ergocalciferol, (DRISDOL) 1.25 MG (50000 UT) CAPS capsule Take 1 capsule (50,000 Units total) by mouth every 7 (seven) days.  . [DISCONTINUED] benzonatate (TESSALON) 200 MG capsule  Take 1 capsule (200 mg total) by mouth 3 (three) times daily as needed for cough.  . [DISCONTINUED] ipratropium (ATROVENT) 0.06 % nasal spray Place 2 sprays into both nostrils 4 (four) times daily as needed (Rhinitis, congestion). (Patient not taking: Reported on 03/17/2020)  . [DISCONTINUED] progesterone (PROMETRIUM) 100 MG capsule Take 1 capsule (100 mg total) by mouth daily.   No facility-administered encounter medications on file as of 03/17/2020.    Surgical History: Past Surgical History:  Procedure Laterality Date  . BREAST BIOPSY Left 2003   needle bx-neg  . BREAST SURGERY Bilateral 2004   breast reduction  . CATARACT EXTRACTION W/PHACO Left 02/06/2018   Procedure: CATARACT EXTRACTION PHACO AND INTRAOCULAR LENS PLACEMENT (IOC);  Surgeon: Birder Robson, MD;  Location: [facility] ORS;  Service: Ophthalmology;  Laterality: Left;  [place] 00:31.5 CDE 3.49 Fluid pack Lot # [id number]  . COLON SURGERY  2004   colon removed  . CTR    . EXTRACORPOREAL SHOCK WAVE LITHOTRIPSY Right 02/26/2015   Procedure: EXTRACORPOREAL SHOCK WAVE LITHOTRIPSY (ESWL);  Surgeon: Collier Flowers, MD;  Location: [facility] ORS;  Service: Urology;  Laterality: Right;  . REDUCTION MAMMAPLASTY Bilateral 2005    Medical History: Past Medical History:  Diagnosis Date  . Anxiety 11/21/2013  . Breath shortness 11/21/2013  . Crohn disease ([place])   . Dizziness 11/21/2013  . Extensor tenosynovitis of wrist 12/24/2013  . Ganglion cyst of wrist 12/24/2013  . Hypotension, postural 11/22/2013  . Psoriasis   . Skin cancer     Family History: Family History  Problem Relation Age of Onset  .  Diabetes Father   . Diabetes Mother   . Heart disease Maternal Grandmother   . Breast cancer Neg Hx       Review of Systems  Constitutional: Positive for fatigue. Negative for chills and diaphoresis.  HENT: Negative for ear pain, postnasal drip and sinus pressure.   Eyes: Negative for photophobia, discharge, redness, itching and visual  disturbance.  Respiratory: Positive for shortness of breath. Negative for cough and wheezing.   Cardiovascular: Negative for chest pain, palpitations and leg swelling.  Gastrointestinal: Negative for abdominal pain, constipation, diarrhea, nausea and vomiting.  Genitourinary: Negative for dysuria and flank pain.  Musculoskeletal: Negative for arthralgias, back pain, gait problem and neck pain.  Skin: Negative for color change.  Allergic/Immunologic: Negative for environmental allergies and food allergies.  Neurological: Positive for weakness. Negative for dizziness and headaches.  Hematological: Does not bruise/bleed easily.  Psychiatric/Behavioral: Negative for agitation, behavioral problems (depression) and hallucinations.     Vital Signs: BP 120/80   Pulse 77   Temp 97.8 F (36.6 C)   Resp 16   Ht 5' 8.5" (1.74 m)   Wt 193 lb (87.5 kg)   SpO2 98%   BMI 28.92 kg/m    Physical Exam Vitals reviewed.  Constitutional:      Appearance: Normal appearance. She is normal weight.  HENT:     Head: Normocephalic.     Right Ear: Tympanic membrane normal.     Left Ear: Tympanic membrane normal.     Nose: Nose normal.     Mouth/Throat:     Mouth: Mucous membranes are moist.     Pharynx: Oropharynx is clear.  Eyes:     Pupils: Pupils are equal, round, and reactive to light.  Cardiovascular:     Rate and Rhythm: Normal rate and regular rhythm.     Pulses: Normal pulses.     Heart sounds: Normal heart sounds.  Pulmonary:     Effort: Pulmonary effort is normal.     Breath sounds: Normal breath sounds.  Chest:  Breasts:     Right: Normal.     Left: Normal.    Abdominal:     General: Abdomen is flat.  Musculoskeletal:        General: Normal range of motion.     Cervical back: Normal range of motion.  Skin:    General: Skin is warm.  Neurological:     General: No focal deficit present.     Mental Status: She is alert and oriented to person, place, and time. Mental status  is at baseline.  Psychiatric:        Mood and Affect: Mood normal.        Behavior: Behavior normal.        Thought Content: Thought content normal.        Judgment: Judgment normal.      LABS: Recent Results (from the past 2160 hour(s))  ANA w/Reflex if Positive     Status: None   Collection Time: 01/03/20  8:14 AM  Result Value Ref Range   Anti Nuclear Antibody (ANA) Negative Negative    Comment: (NOTE) Performed At: [facility] [place], [place] [id number] Rush Farmer MD OA:[id number]   Comprehensive metabolic panel     Status: None   Collection Time: 01/03/20  8:23 AM  Result Value Ref Range   Sodium 140 135 - 145 mmol/L   Potassium 4.0 3.5 - 5.1 mmol/L   Chloride 102 98 - 111 mmol/L  CO2 27 22 - 32 mmol/L   Glucose, Bld 92 70 - 99 mg/dL    Comment: Glucose reference range applies only to samples taken after fasting for at least 8 hours.   BUN 19 6 - 20 mg/dL   Creatinine, Ser 0.87 0.44 - 1.00 mg/dL   Calcium 9.9 8.9 - 10.3 mg/dL   Total Protein 7.4 6.5 - 8.1 g/dL   Albumin 4.0 3.5 - 5.0 g/dL   AST 23 15 - 41 U/L   ALT 19 0 - 44 U/L   Alkaline Phosphatase 65 38 - 126 U/L   Total Bilirubin 0.5 0.3 - 1.2 mg/dL   GFR calc non Af Amer >60 >60 mL/min   GFR calc Af Amer >60 >60 mL/min   Anion gap 11 5 - 15    Comment: Performed at [facility], [address], [place], [place] [address]  Iron and TIBC     Status: None   Collection Time: 01/03/20  8:23 AM  Result Value Ref Range   Iron 88 28 - 170 ug/dL   TIBC 316 250 - 450 ug/dL   Saturation Ratios 28 10.4 - 31.8 %   UIBC 228 ug/dL    Comment: Performed at [facility], [address] [place], [place] [address]  Ferritin     Status: None   Collection Time: 01/03/20  8:23 AM  Result Value Ref Range   Ferritin 256 11 - 307 ng/mL    Comment: Performed at [facility], [address] [place], [place] [address]  Fibrin derivatives D-Dimer [facility] only)      Status: Abnormal   Collection Time: 01/03/20  8:23 AM  Result Value Ref Range   Fibrin derivatives D-dimer ([facility]) 1,617.03 (H) 0.00 - 499.00 ng/mL (FEU)    Comment: (NOTE) <> Exclusion of Venous Thromboembolism (VTE) - OUTPATIENT ONLY   (Emergency Department or [place])    0-499 ng/ml (FEU): With a low to intermediate pretest probability                      for VTE this test result excludes the diagnosis                      of VTE.   >499 ng/ml (FEU) : VTE not excluded; additional work up for VTE is                      required.  <> Testing on Inpatients and Evaluation of Disseminated Intravascular   Coagulation (DIC) Reference Range:   0-499 ng/ml (FEU) Performed at [facility], [address], [place], [place] [address]   Sedimentation rate     Status: Abnormal   Collection Time: 01/03/20  8:23 AM  Result Value Ref Range   Sed Rate 57 (H) 0 - 30 mm/hr    Comment: Performed at [facility], [address], [place], [place] [address]  CBC with Differential/Platelet     Status: Abnormal   Collection Time: 01/03/20  8:23 AM  Result Value Ref Range   WBC 4.1 4.0 - 10.5 K/uL   RBC 3.83 (L) 3.87 - 5.11 MIL/uL   Hemoglobin 11.7 (L) 12.0 - 15.0 g/dL   HCT 36.0 36.0 - 46.0 %   MCV 94.0 80.0 - 100.0 fL   MCH 30.5 26.0 - 34.0 pg   MCHC 32.5 30.0 - 36.0 g/dL   RDW 13.6 11.5 -  15.5 %   Platelets 373 150 - 400 K/uL   nRBC 0.0 0.0 - 0.2 %   Neutrophils Relative % 54 %   Neutro Abs 2.2 1.7 - 7.7 K/uL   Lymphocytes Relative 27 %   Lymphs Abs 1.1 0.7 - 4.0 K/uL   Monocytes Relative 11 %   Monocytes Absolute 0.5 0.1 - 1.0 K/uL   Eosinophils Relative 6 %   Eosinophils Absolute 0.2 0.0 - 0.5 K/uL   Basophils Relative 1 %   Basophils Absolute 0.0 0.0 - 0.1 K/uL   Immature Granulocytes 1 %   Abs Immature Granulocytes 0.02 0.00 - 0.07 K/uL    Comment: Performed at [facility], [address], [place], [place] [address]  Basic metabolic panel      Status: None   Collection Time: 01/16/20 11:05 AM  Result Value Ref Range   Sodium 138 135 - 145 mmol/L   Potassium 3.9 3.5 - 5.1 mmol/L   Chloride 101 98 - 111 mmol/L   CO2 27 22 - 32 mmol/L   Glucose, Bld 96 70 - 99 mg/dL    Comment: Glucose reference range applies only to samples taken after fasting for at least 8 hours.   BUN 15 6 - 20 mg/dL   Creatinine, Ser 0.85 0.44 - 1.00 mg/dL   Calcium 9.6 8.9 - 10.3 mg/dL   GFR calc non Af Amer >60 >60 mL/min   Anion gap 10 5 - 15    Comment: Performed at [facility], [address] [place], [place] [address]  Fibrin derivatives D-Dimer [facility] only)     Status: Abnormal   Collection Time: 01/16/20 11:05 AM  Result Value Ref Range   Fibrin derivatives D-dimer ([facility]) 1,189.43 (H) 0.00 - 499.00 ng/mL (FEU)    Comment: (NOTE) <> Exclusion of Venous Thromboembolism (VTE) - OUTPATIENT ONLY   (Emergency Department or [place])    0-499 ng/ml (FEU): With a low to intermediate pretest probability                      for VTE this test result excludes the diagnosis                      of VTE.   >499 ng/ml (FEU) : VTE not excluded; additional work up for VTE is                      required.  <> Testing on Inpatients and Evaluation of Disseminated Intravascular   Coagulation (DIC) Reference Range:   0-499 ng/ml (FEU) Performed at [facility], [address], [place], [place] [address]   UA/M w/rflx Culture, Routine     Status: Abnormal (Preliminary result)   Collection Time: 03/17/20  9:35 AM   Specimen: Urine   Urine  Result Value Ref Range   Specific Gravity, UA 1.022 1.005 - 1.030   pH, UA 5.0 5.0 - 7.5   Color, UA Yellow Yellow   Appearance Ur Turbid (A) Clear   Leukocytes,UA 2+ (A) Negative   Protein,UA Negative Negative/Trace   Glucose, UA Negative Negative   Ketones, UA Negative Negative   RBC, UA Negative Negative   Bilirubin, UA Negative Negative   Urobilinogen, Ur 0.2 0.2 - 1.0 mg/dL   Nitrite, UA  Negative Negative   Microscopic Examination See below:     Comment: Microscopic was indicated and was performed.   Urinalysis Reflex Comment  Comment: This specimen has reflexed to a Urine Culture.  Microscopic Examination     Status: Abnormal   Collection Time: 03/17/20  9:35 AM   Urine  Result Value Ref Range   WBC, UA 0-5 0 - 5 /hpf   RBC 0-2 0 - 2 /hpf   Epithelial Cells (non renal) 0-10 0 - 10 /hpf   Casts None seen None seen /lpf   Crystals Present (A) N/A   Crystal Type Calcium Oxalate N/A    Comment: Uric Acid   Bacteria, UA None seen None seen/Few  Urine Culture, Reflex     Status: None (Preliminary result)   Collection Time: 03/17/20  9:35 AM   Urine  Result Value Ref Range   Urine Culture, Routine WILL FOLLOW   TSH     Status: None   Collection Time: 03/18/20 10:21 AM  Result Value Ref Range   TSH 1.623 0.350 - 4.500 uIU/mL    Comment: Performed by a 3rd Generation assay with a functional sensitivity of <=0.01 uIU/mL. Performed at [facility], [address] [place], [place] [address]   T4, free     Status: None   Collection Time: 03/18/20 10:21 AM  Result Value Ref Range   Free T4 0.64 0.61 - 1.12 ng/dL    Comment: (NOTE) Biotin ingestion may interfere with free T4 tests. If the results are inconsistent with the TSH level, previous test results, or the clinical presentation, then consider biotin interference. If needed, order repeat testing after stopping biotin. Performed at [facility], [address] [place], [place] [address]   Comprehensive metabolic panel     Status: None   Collection Time: 03/18/20 10:21 AM  Result Value Ref Range   Sodium 140 135 - 145 mmol/L   Potassium 4.3 3.5 - 5.1 mmol/L   Chloride 105 98 - 111 mmol/L   CO2 26 22 - 32 mmol/L   Glucose, Bld 85 70 - 99 mg/dL    Comment: Glucose reference range applies only to samples taken after fasting for at least 8 hours.   BUN 17 8 - 23 mg/dL   Creatinine, Ser  0.75 0.44 - 1.00 mg/dL   Calcium 9.5 8.9 - 10.3 mg/dL   Total Protein 7.0 6.5 - 8.1 g/dL   Albumin 4.1 3.5 - 5.0 g/dL   AST 22 15 - 41 U/L   ALT 18 0 - 44 U/L   Alkaline Phosphatase 52 38 - 126 U/L   Total Bilirubin 0.5 0.3 - 1.2 mg/dL   GFR, Estimated >60 >60 mL/min    Comment: (NOTE) Calculated using the CKD-EPI Creatinine Equation (2021)    Anion gap 9 5 - 15    Comment: Performed at [facility], [address], [place], [place] [address]  Lipid panel     Status: Abnormal   Collection Time: 03/18/20 10:21 AM  Result Value Ref Range   Cholesterol 252 (H) 0 - 200 mg/dL   Triglycerides 109 <150 mg/dL   HDL 100 >40 mg/dL   Total CHOL/HDL Ratio 2.5 RATIO   VLDL 22 0 - 40 mg/dL   LDL Cholesterol 130 (H) 0 - 99 mg/dL    Comment:        Total Cholesterol/HDL:CHD Risk Coronary Heart Disease Risk Table                     Men   Women  1/2 Average Risk   3.4   3.3  Average Risk       5.0   4.4  2 X Average Risk   9.6   7.1  3 X Average Risk  23.4   11.0        Use the calculated Patient Ratio above and the CHD Risk Table to determine the patient's CHD Risk.        ATP III CLASSIFICATION (LDL):  <100     mg/dL   Optimal  100-129  mg/dL   Near or Above                    Optimal  130-159  mg/dL   Borderline  160-189  mg/dL   High  >190     mg/dL   Very High Performed at [facility], [address] [place], [place] [address]   Vitamin B12     Status: None   Collection Time: 03/18/20 10:21 AM  Result Value Ref Range   Vitamin B-12 300 180 - 914 pg/mL    Comment: (NOTE) This assay is not validated for testing neonatal or myeloproliferative syndrome specimens for Vitamin B12 levels. Performed at [facility], [place] [address] [place], [place] [address]   VITAMIN D 25 Hydroxy (Vit-D Deficiency, Fractures)     Status: None   Collection Time: 03/18/20 10:21 AM  Result Value Ref Range   Vit D, 25-Hydroxy 44.04 30 - 100 ng/mL    Comment:  (NOTE) Vitamin D deficiency has been defined by the [place] practice guideline as a level of serum 25-OH  vitamin D less than 20 ng/mL (1,2). The Endocrine Society went on to  further define vitamin D insufficiency as a level between 21 and 29  ng/mL (2).  1. IOM (Institute of Medicine). 2010. Dietary reference intakes for  calcium and D. [place]: The [employer]. 2. Holick MF, Binkley [place], Bischoff-Ferrari HA, et al. Evaluation,  treatment, and prevention of vitamin D deficiency: an Endocrine  Society clinical practice guideline, JCEM. 2011 Jul; 96(7): 1911-30.  Performed at [facility], [place] [address] [place], [place] [address]   Fibrin derivatives D-Dimer [facility] only)     Status: None   Collection Time: 03/18/20 10:21 AM  Result Value Ref Range   Fibrin derivatives D-dimer ([facility]) 492.67 0.00 - 499.00 ng/mL (FEU)    Comment: (NOTE) <> Exclusion of Venous Thromboembolism (VTE) - OUTPATIENT ONLY   (Emergency Department or [place])    0-499 ng/ml (FEU): With a low to intermediate pretest probability                      for VTE this test result excludes the diagnosis                      of VTE.   >499 ng/ml (FEU) : VTE not excluded; additional work up for VTE is                      required.  <> Testing on Inpatients and Evaluation of Disseminated Intravascular   Coagulation (DIC) Reference Range:   0-499 ng/ml (FEU) Performed at [facility], [address], [place], [place] [address]   Miscellaneous [facility] test (send-out)     Status: None   Collection Time: 03/18/20 10:21 AM  Result Value Ref Range   [facility] test code [id number]    [facility] test name  COVID AB IGG     Comment: Performed at [facility], [address], [place], [place] [address]   Misc [facility] result COMMENT     Comment: (NOTE) Test Ordered: [id number] SARS-CoV-2 Antibody, IgG SARS-CoV-2 Semi-Quant IgG Ab   270.0             AU/mL    BN     Reference Range: Neg <13.0                             This test has not been FDA cleared or approved. This test has been authorized by FDA under an Emergency Use Authorization (EUA). This test is only authorized for the duration of the declaration that circumstances exist justifying the authorization of emergency use of in vitro diagnostics for detection and/or diagnosis of COVID-19 under Section 564(b)(1) of the Act, 21 U.S.C. 196QIW-9(N)(9), unless the authorization is terminated or revoked sooner. This test has been authorized only for detecting the presence of antibodies against SARS-CoV-2, not for any other viruses or pathogens. SARS-CoV-2 Spike Ab Interp     Positive                  BN   Antibodies against the SARS-CoV-2 spike protein, including the receptor binding domain (RBD) were detected. It is not yet known what level of antibody to SARS-Co V-2 spike protein correlates to immunity against developing symptomatic SARS-CoV-2 disease. This assay was performed using [employer](R) SARS-CoV-2 Trimeric S IgG assay. This test has not been FDA cleared or approved. This test has been authorized by FDA under an Emergency Use Authorization (EUA). This test is only authorized for the duration of the declaration that circumstances exist justifying the authorization of emergency use of in vitro diagnostics for detection and/or diagnosis of COVID-19 under Section 564(b)(1) of the Act, 21 U.S.C. 892JJH-4(R)(7), unless the authorization is terminated or revoked sooner. This test has been authorized only for detecting the presence of antibodies against SARS-CoV-2, not for any other viruses or pathogens. Performed At: [facility] [place], [place] [id number] Rush Farmer MD HU:[id number]     Assessment/Plan: 1. Encounter for routine adult health examination with abnormal findings Well appearing 59 year old female, will review routine labs  and adjust plan of care as needed Up to date on PHM - TSH + free T4 - Comprehensive Metabolic Panel (CMET) - Lipid Panel With LDL/HDL Ratio  2. Positive D dimer Repeat and monitor for stabilization - D-Dimer, Quantitative  3. Encounter for screening for COVID-19 Monitor for presence of antibodies, encouraged to consider vaccination - SAR CoV2 Serology (COVID 19)AB(IGG)IA - B12 - Vitamin D (25 hydroxy) - DG Chest 2 View; Future  4. Dyspnea on exertion Obtain updated CXR post COVID pneumonia hospitalization - DG Chest 2 View; Future  5. Dysuria - UA/M w/rflx Culture, Routine - Microscopic Examination - Urine Culture, Reflex  6. Other fatigue Review labs for underlying cause of fatigue - TSH + free T4 - Comprehensive Metabolic Panel (CMET) - C58 - Vitamin D (25 hydroxy)  7. Psoriasis Acute flare resolved at this time  General Counseling: Ladawna verbalizes understanding of the findings of todays visit and agrees with plan of treatment. I have discussed any further diagnostic evaluation that may be needed or ordered today. We also reviewed her medications today. she has been encouraged to call the office with any questions or concerns that should arise related to todays  visit.    Counseling:    Orders Placed This Encounter  Procedures  . Microscopic Examination  . Urine Culture, Reflex  . DG Chest 2 View  . SAR CoV2 Serology (COVID 19)AB(IGG)IA  . TSH + free T4  . Comprehensive Metabolic Panel (CMET)  . D-Dimer, Quantitative  . Lipid Panel With LDL/HDL Ratio  . B12  . Vitamin D (25 hydroxy)  . UA/M w/rflx Culture, Routine      Total time spent: 30 Minutes  Time spent includes review of chart, medications, test results, and follow up plan with the patient.   This patient was seen by Casey Burkitt AGNP-C Collaboration with Dr Lavera Guise as a part of collaborative care agreement   Tanna Furry. [facility] Internal Medicine

## 2020-03-18 ENCOUNTER — Other Ambulatory Visit
Admission: RE | Admit: 2020-03-18 | Discharge: 2020-03-18 | Disposition: A | Discharge status: Home / Self Care | Payer: 59 | Attending: Hospice and Palliative Medicine | Admitting: Hospice and Palliative Medicine

## 2020-03-18 ENCOUNTER — Other Ambulatory Visit: Payer: Self-pay

## 2020-03-18 ENCOUNTER — Other Ambulatory Visit: Payer: Self-pay | Admitting: Hospice and Palliative Medicine

## 2020-03-18 DIAGNOSIS — Z8616 Personal history of COVID-19: Secondary | ICD-10-CM

## 2020-03-18 DIAGNOSIS — L409 Psoriasis, unspecified: Secondary | ICD-10-CM | POA: Diagnosis not present

## 2020-03-18 DIAGNOSIS — Z0000 Encounter for general adult medical examination without abnormal findings: Secondary | ICD-10-CM | POA: Diagnosis present

## 2020-03-18 DIAGNOSIS — Z0184 Encounter for antibody response examination: Secondary | ICD-10-CM | POA: Insufficient documentation

## 2020-03-18 DIAGNOSIS — R0600 Dyspnea, unspecified: Secondary | ICD-10-CM | POA: Insufficient documentation

## 2020-03-18 DIAGNOSIS — R300 Dysuria: Secondary | ICD-10-CM | POA: Diagnosis not present

## 2020-03-18 DIAGNOSIS — R7989 Other specified abnormal findings of blood chemistry: Secondary | ICD-10-CM | POA: Diagnosis not present

## 2020-03-18 DIAGNOSIS — R5383 Other fatigue: Secondary | ICD-10-CM | POA: Insufficient documentation

## 2020-03-18 LAB — LIPID PANEL
Cholesterol: 252 mg/dL — ABNORMAL HIGH (ref 0–200)
HDL: 100 mg/dL (ref >40)
LDL Cholesterol: 130 mg/dL — ABNORMAL HIGH (ref 0–99)
Total CHOL/HDL Ratio: 2.5 RATIO
Triglycerides: 109 mg/dL (ref <150)
VLDL: 22 mg/dL (ref 0–40)

## 2020-03-18 LAB — COMPREHENSIVE METABOLIC PANEL
ALT: 18 U/L (ref 0–44)
AST: 22 U/L (ref 15–41)
Albumin: 4.1 g/dL (ref 3.5–5.0)
Alkaline Phosphatase: 52 U/L (ref 38–126)
Anion gap: 9 (ref 5–15)
BUN: 17 mg/dL (ref 8–23)
CO2: 26 mmol/L (ref 22–32)
Calcium: 9.5 mg/dL (ref 8.9–10.3)
Chloride: 105 mmol/L (ref 98–111)
Creatinine, Ser: 0.75 mg/dL (ref 0.44–1.00)
GFR, Estimated: >60 mL/min (ref >60)
Glucose, Bld: 85 mg/dL (ref 70–99)
Potassium: 4.3 mmol/L (ref 3.5–5.1)
Sodium: 140 mmol/L (ref 135–145)
Total Bilirubin: 0.5 mg/dL (ref 0.3–1.2)
Total Protein: 7 g/dL (ref 6.5–8.1)

## 2020-03-18 LAB — FIBRIN DERIVATIVES D-DIMER (ARMC ONLY): Fibrin derivatives D-dimer (ARMC): 492.67 ng/mL (FEU) (ref 0.00–499.00)

## 2020-03-18 LAB — VITAMIN D 25 HYDROXY (VIT D DEFICIENCY, FRACTURES): Vit D, 25-Hydroxy: 44.04 ng/mL (ref 30–100)

## 2020-03-18 LAB — T4, FREE: Free T4: 0.64 ng/dL (ref 0.61–1.12)

## 2020-03-18 LAB — TSH: TSH: 1.623 u[IU]/mL (ref 0.350–4.500)

## 2020-03-18 LAB — VITAMIN B12: Vitamin B-12: 300 pg/mL (ref 180–914)

## 2020-03-18 NOTE — Progress Notes (Signed)
Per patients request, lab order for COVID antibody testing placed. Will follow-up with results.

## 2020-03-18 NOTE — Progress Notes (Signed)
Called and discussed D-dimer, CMP and chest x-ray results with patients. Awaiting other labs to results.

## 2020-03-18 NOTE — Progress Notes (Signed)
Please call Ms. Hangartner and let her know her Vitamin B12 levels are on lower end of normal. Is she interested in replacement injections? If so, can we please schedule this for her? Weekly x3 then monthly. Thanks!

## 2020-03-19 LAB — MISC LABCORP TEST (SEND OUT): Labcorp test code: 164055

## 2020-03-19 NOTE — Progress Notes (Signed)
Yes, we can order them to her pharmacy. Thanks!

## 2020-03-20 ENCOUNTER — Encounter: Payer: Self-pay | Admitting: Hospice and Palliative Medicine

## 2020-03-20 ENCOUNTER — Other Ambulatory Visit: Payer: Self-pay

## 2020-03-20 MED ORDER — CYANOCOBALAMIN 1000 MCG/ML IJ SOLN: INTRAMUSCULAR | 6 refills | Status: DC

## 2020-03-24 ENCOUNTER — Other Ambulatory Visit: Payer: Self-pay | Admitting: Family Medicine

## 2020-03-24 LAB — URINE CULTURE, REFLEX

## 2020-03-24 LAB — UA/M W/RFLX CULTURE, ROUTINE
Bilirubin, UA: NEGATIVE
Glucose, UA: NEGATIVE
Ketones, UA: NEGATIVE
Nitrite, UA: NEGATIVE
Protein,UA: NEGATIVE
RBC, UA: NEGATIVE
Specific Gravity, UA: 1.022 (ref 1.005–1.030)
Urobilinogen, Ur: 0.2 mg/dL (ref 0.2–1.0)
pH, UA: 5 (ref 5.0–7.5)

## 2020-03-24 LAB — MICROSCOPIC EXAMINATION
Bacteria, UA: NONE SEEN
Casts: NONE SEEN /lpf

## 2020-04-01 ENCOUNTER — Other Ambulatory Visit: Payer: Self-pay

## 2020-04-01 ENCOUNTER — Ambulatory Visit
Admission: RE | Admit: 2020-04-01 | Discharge: 2020-04-01 | Disposition: A | Discharge status: Home / Self Care | Payer: 59 | Source: Ambulatory Visit | Attending: Internal Medicine | Admitting: Internal Medicine

## 2020-04-01 DIAGNOSIS — Z1231 Encounter for screening mammogram for malignant neoplasm of breast: Secondary | ICD-10-CM | POA: Insufficient documentation

## 2020-05-18 ENCOUNTER — Ambulatory Visit: Payer: 59 | Admitting: Hospice and Palliative Medicine

## 2020-06-15 ENCOUNTER — Ambulatory Visit (INDEPENDENT_AMBULATORY_CARE_PROVIDER_SITE_OTHER): Payer: 59 | Admitting: Hospice and Palliative Medicine

## 2020-06-15 ENCOUNTER — Other Ambulatory Visit: Payer: Self-pay | Admitting: Internal Medicine

## 2020-06-15 ENCOUNTER — Encounter: Payer: Self-pay | Admitting: Hospice and Palliative Medicine

## 2020-06-15 ENCOUNTER — Other Ambulatory Visit: Payer: Self-pay

## 2020-06-15 VITALS — BP 124/80 | HR 74 | Temp 97.9°F | Resp 16 | Ht 68.5 in | Wt 194.4 lb

## 2020-06-15 DIAGNOSIS — E782 Mixed hyperlipidemia: Secondary | ICD-10-CM | POA: Diagnosis not present

## 2020-06-15 DIAGNOSIS — R0609 Other forms of dyspnea: Secondary | ICD-10-CM

## 2020-06-15 DIAGNOSIS — R600 Localized edema: Secondary | ICD-10-CM

## 2020-06-15 DIAGNOSIS — E538 Deficiency of other specified B group vitamins: Secondary | ICD-10-CM

## 2020-06-15 DIAGNOSIS — E039 Hypothyroidism, unspecified: Secondary | ICD-10-CM

## 2020-06-15 DIAGNOSIS — E038 Other specified hypothyroidism: Secondary | ICD-10-CM

## 2020-06-15 DIAGNOSIS — R300 Dysuria: Secondary | ICD-10-CM | POA: Diagnosis not present

## 2020-06-15 DIAGNOSIS — R0600 Dyspnea, unspecified: Secondary | ICD-10-CM | POA: Diagnosis not present

## 2020-06-15 LAB — POCT URINALYSIS DIPSTICK
Bilirubin, UA: NEGATIVE
Blood, UA: NEGATIVE
Glucose, UA: NEGATIVE
Ketones, UA: NEGATIVE
Leukocytes, UA: NEGATIVE
Nitrite, UA: NEGATIVE
Protein, UA: POSITIVE — AB
Spec Grav, UA: 1.01 (ref 1.010–1.025)
Urobilinogen, UA: 0.2 E.U./dL
pH, UA: 5 (ref 5.0–8.0)

## 2020-06-15 MED ORDER — CYANOCOBALAMIN 1000 MCG/ML IJ SOLN: INTRAMUSCULAR | 6 refills | Status: DC

## 2020-06-15 MED ORDER — LEVOTHYROXINE SODIUM 25 MCG PO TABS: 25.0000 ug | ORAL_TABLET | Freq: Every day | ORAL | 0 refills | Status: DC

## 2020-06-15 NOTE — Progress Notes (Signed)
[facility] [place], [place] [address]  Internal MEDICINE  Office Visit Note  Patient Name: Ruth Morales  [id number]  [id number]  Date of Service: 06/16/2020  Chief Complaint  Patient presents with  . Follow-up    2 month  . burning with urination    Started last week and is not having urgency    HPI Patient is here for routine follow-up Reviewed labs--abnormal lipid panel, B12 levels borderline low--has recently started supplement injections at home, D-dimer has returned to normal limits, T4 borderline normal   Continues to complain of ongoing fatigue, hair loss and shortness of breath post COVID Shortness of breath is slowly improving, mostly with exertion, walking up and down the stairs at her home occasionally Coughing has mostly resolved   CXR 12/7 negative for acute processes Mammogram 12/22--normal, recommend repeat screening mammogram in 1 year  C/o burning with urination--symptoms started over the weekend but have improved, denies any further urinary complaints   Current Medication: Outpatient Encounter Medications as of 06/15/2020  Medication Sig  . Calcium Citrate-Vitamin D 315-250 MG-UNIT TABS Take by mouth.  Marland Kitchen ibuprofen (ADVIL,MOTRIN) 200 MG tablet Take 600 mg by mouth every 6 (six) hours as needed for headache or moderate pain.   Marland Kitchen levothyroxine (SYNTHROID) 25 MCG tablet Take 1 tablet (25 mcg total) by mouth daily before breakfast.  . loperamide (IMODIUM) 2 MG capsule Take 4 mg by mouth 2 (two) times daily.  . [DISCONTINUED] cyanocobalamin (,VITAMIN B-12,) 1000 MCG/ML injection Inject 1000 MCG/ML once a week for 3 weeks then once a month for three months.  Please include syringes  . [DISCONTINUED] promethazine (PHENERGAN) 12.5 MG tablet Take 1 tablet (12.5 mg total) by mouth every 8 (eight) hours as needed for nausea or vomiting.  . [DISCONTINUED] Vitamin D, Ergocalciferol, (DRISDOL) 1.25 MG (50000 UT) CAPS capsule Take 1 capsule (50,000  Units total) by mouth every 7 (seven) days.  . cyanocobalamin (,VITAMIN B-12,) 1000 MCG/ML injection Inject 1000 MCG/ML once a week for 3 weeks then once a month for three months.  Please include syringes  . [DISCONTINUED] furosemide (LASIX) 20 MG tablet TAKE 1 TABLET BY MOUTH EVERY DAY (Patient not taking: Reported on 06/15/2020)  . [DISCONTINUED] progesterone (PROMETRIUM) 100 MG capsule Take 1 capsule (100 mg total) by mouth daily.   No facility-administered encounter medications on file as of 06/15/2020.    Surgical History: Past Surgical History:  Procedure Laterality Date  . BREAST BIOPSY Left 2003   needle bx-neg  . BREAST SURGERY Bilateral 2004   breast reduction  . CATARACT EXTRACTION W/PHACO Left 02/06/2018   Procedure: CATARACT EXTRACTION PHACO AND INTRAOCULAR LENS PLACEMENT (IOC);  Surgeon: Birder Robson, MD;  Location: [facility] ORS;  Service: Ophthalmology;  Laterality: Left;  [place] 00:31.5 CDE 3.49 Fluid pack Lot # [id number]  . COLON SURGERY  2004   colon removed  . CTR    . EXTRACORPOREAL SHOCK WAVE LITHOTRIPSY Right 02/26/2015   Procedure: EXTRACORPOREAL SHOCK WAVE LITHOTRIPSY (ESWL);  Surgeon: Collier Flowers, MD;  Location: [facility] ORS;  Service: Urology;  Laterality: Right;  . REDUCTION MAMMAPLASTY Bilateral 2005    Medical History: Past Medical History:  Diagnosis Date  . Anxiety 11/21/2013  . Breath shortness 11/21/2013  . Crohn disease ([place])   . Dizziness 11/21/2013  . Extensor tenosynovitis of wrist 12/24/2013  . Ganglion cyst of wrist 12/24/2013  . Hypotension, postural 11/22/2013  . Psoriasis   . Skin cancer     Family History:  Family History  Problem Relation Age of Onset  . Diabetes Father   . Diabetes Mother   . Heart disease Maternal Grandmother   . Breast cancer Neg Hx     Social History   Socioeconomic History  . Marital status: Married    Spouse name: Not on file  . Number of children: Not on file  . Years of education: Not on file  . Highest  education level: Not on file  Occupational History  . Not on file  Tobacco Use  . Smoking status: Former Research scientist (life sciences)  . Smokeless tobacco: Never Used  Vaping Use  . Vaping Use: Never used  Substance and Sexual Activity  . Alcohol use: Yes    Alcohol/week: 1.0 standard drink    Types: 1 Standard drinks or equivalent per week    Comment: occasional drink, once a month  . Drug use: No  . Sexual activity: Yes    Partners: Male  Other Topics Concern  . Not on file  Social History Narrative  . Not on file   Social Determinants of Health   Financial Resource Strain: Not on file  Food Insecurity: Not on file  Transportation Needs: Not on file  Physical Activity: Not on file  Stress: Not on file  Social Connections: Not on file  Intimate Partner Violence: Not on file      Review of Systems  Constitutional: Positive for fatigue. Negative for chills and diaphoresis.  HENT: Negative for ear pain, postnasal drip and sinus pressure.        Hair loss  Eyes: Negative for photophobia, discharge, redness, itching and visual disturbance.  Respiratory: Positive for shortness of breath. Negative for cough and wheezing.   Cardiovascular: Negative for chest pain, palpitations and leg swelling.  Gastrointestinal: Negative for abdominal pain, constipation, diarrhea, nausea and vomiting.  Genitourinary: Positive for dysuria. Negative for flank pain.  Musculoskeletal: Negative for arthralgias, back pain, gait problem and neck pain.  Skin: Negative for color change.  Allergic/Immunologic: Negative for environmental allergies and food allergies.  Neurological: Negative for dizziness and headaches.  Hematological: Does not bruise/bleed easily.  Psychiatric/Behavioral: Negative for agitation, behavioral problems (depression) and hallucinations.    Vital Signs: BP 124/80   Pulse 74   Temp 97.9 F (36.6 C)   Resp 16   Ht 5' 8.5" (1.74 m)   Wt 194 lb 6.4 oz (88.2 kg)   SpO2 97%   BMI 29.13 kg/m     Physical Exam Vitals reviewed.  Constitutional:      Appearance: Normal appearance. She is normal weight.  Cardiovascular:     Rate and Rhythm: Normal rate and regular rhythm.     Pulses: Normal pulses.     Heart sounds: Normal heart sounds.  Pulmonary:     Effort: Pulmonary effort is normal.     Breath sounds: Normal breath sounds.  Abdominal:     General: Abdomen is flat.     Palpations: Abdomen is soft.  Musculoskeletal:        General: Normal range of motion.     Cervical back: Normal range of motion.  Skin:    General: Skin is warm.  Neurological:     General: No focal deficit present.     Mental Status: She is alert and oriented to person, place, and time. Mental status is at baseline.  Psychiatric:        Mood and Affect: Mood normal.        Behavior: Behavior normal.  Thought Content: Thought content normal.        Judgment: Judgment normal.    Assessment/Plan: 1. Subclinical hypothyroidism Start low dose levothyroxine due to borderline low T4 levels and symptomatic Recheck levels in 6 weeks after starting medication Discussed proper way of taking medication--30 minutes prior to food or other medications - TSH + free T4  2. Dyspnea on exertion Slowly improving, consider PFT or echocardiogram  3. Vitamin B12 deficiency Continue with B12 injections at home  4. Mixed hyperlipidemia Will need to discuss statin therapy Consider further vascular studies  5. Dysuria Proteinuria--monitor closely - POCT Urinalysis Dipstick  General Counseling: Helena verbalizes understanding of the findings of todays visit and agrees with plan of treatment. I have discussed any further diagnostic evaluation that may be needed or ordered today. We also reviewed her medications today. she has been encouraged to call the office with any questions or concerns that should arise related to todays visit.    Orders Placed This Encounter  Procedures  . TSH + free T4  . POCT  Urinalysis Dipstick    Meds ordered this encounter  Medications  . levothyroxine (SYNTHROID) 25 MCG tablet    Sig: Take 1 tablet (25 mcg total) by mouth daily before breakfast.    Dispense:  90 tablet    Refill:  0  . cyanocobalamin (,VITAMIN B-12,) 1000 MCG/ML injection    Sig: Inject 1000 MCG/ML once a week for 3 weeks then once a month for three months.  Please include syringes    Dispense:  1 mL    Refill:  6    Time spent: 30 Minutes Time spent includes review of chart, medications, test results and follow-up plan with the patient.  This patient was seen by Theodoro Grist AGNP-C in Collaboration with Dr Lavera Guise as a part of collaborative care agreement     Tanna Furry.  AGNP-C Internal medicine

## 2020-06-16 ENCOUNTER — Encounter: Payer: Self-pay | Admitting: Hospice and Palliative Medicine

## 2020-06-16 ENCOUNTER — Telehealth: Payer: Self-pay

## 2020-06-16 ENCOUNTER — Other Ambulatory Visit: Payer: Self-pay

## 2020-06-16 DIAGNOSIS — R300 Dysuria: Secondary | ICD-10-CM

## 2020-06-16 NOTE — Telephone Encounter (Signed)
Pt called c/o having burning with urination and Per [name] we ordered a urine culture andI called pt and informed her to go to [facility] to do culture and we will see what culture shows before sending any medication in

## 2020-06-21 LAB — CULTURE, URINE COMPREHENSIVE

## 2020-07-03 ENCOUNTER — Encounter: Payer: Self-pay | Admitting: Physician Assistant

## 2020-07-03 ENCOUNTER — Ambulatory Visit (INDEPENDENT_AMBULATORY_CARE_PROVIDER_SITE_OTHER): Payer: 59 | Admitting: Physician Assistant

## 2020-07-03 DIAGNOSIS — N949 Unspecified condition associated with female genital organs and menstrual cycle: Secondary | ICD-10-CM

## 2020-07-03 DIAGNOSIS — R300 Dysuria: Secondary | ICD-10-CM

## 2020-07-03 DIAGNOSIS — R102 Pelvic and perineal pain unspecified side: Secondary | ICD-10-CM

## 2020-07-03 DIAGNOSIS — N9489 Other specified conditions associated with female genital organs and menstrual cycle: Secondary | ICD-10-CM

## 2020-07-03 LAB — POCT URINALYSIS DIPSTICK
Bilirubin, UA: NEGATIVE
Blood, UA: NEGATIVE
Glucose, UA: NEGATIVE
Ketones, UA: NEGATIVE
Leukocytes, UA: NEGATIVE
Nitrite, UA: NEGATIVE
Protein, UA: NEGATIVE
Spec Grav, UA: >=1.03 — AB (ref 1.010–1.025)
Urobilinogen, UA: 0.2 E.U./dL
pH, UA: 5 (ref 5.0–8.0)

## 2020-07-03 NOTE — Progress Notes (Signed)
[facility] [place], [place] [address]  Internal MEDICINE  Office Visit Note  Patient Name: Ruth Morales  [id number]  [id number]  Date of Service: 07/03/2020  Chief Complaint  Patient presents with  . Vaginal Pain    For 2 weeks, not constant, pain is at bottom of pubic area and goes up inside and kind of burns     HPI Pt is here for a sick visit. -About 3 weeks ago had burning but no frequency, and had urine tested which was negative. Now still having burning sensation but not associated with urination, and also pain in lower abdomen and almost cramping. No blood in urine or vaginal bleeding. Feels like she has to take an advil to control the pain. Pain comes and goes for the last 2 weeks. Denies N, V, fever of chills.  -Previously was on estradiol and progesterone awhile back, but didn't think it was helping and has been off of it for ahile. She does mention she does have vaginal dryness, unaware of pain during sex because no intercourse recently due to house being remodeled.  Current Medication:  Outpatient Encounter Medications as of 07/03/2020  Medication Sig  . Calcium Citrate-Vitamin D 315-250 MG-UNIT TABS Take by mouth.  . cyanocobalamin (,VITAMIN B-12,) 1000 MCG/ML injection Inject 1000 MCG/ML once a week for 3 weeks then once a month for three months.  Please include syringes  . ibuprofen (ADVIL,MOTRIN) 200 MG tablet Take 600 mg by mouth every 6 (six) hours as needed for headache or moderate pain.   Marland Kitchen levothyroxine (SYNTHROID) 25 MCG tablet Take 1 tablet (25 mcg total) by mouth daily before breakfast.  . loperamide (IMODIUM) 2 MG capsule Take 4 mg by mouth 2 (two) times daily.  . [DISCONTINUED] progesterone (PROMETRIUM) 100 MG capsule Take 1 capsule (100 mg total) by mouth daily.   No facility-administered encounter medications on file as of 07/03/2020.      Medical History: Past Medical History:  Diagnosis Date  . Anxiety 11/21/2013  .  Breath shortness 11/21/2013  . Crohn disease ([place])   . Dizziness 11/21/2013  . Extensor tenosynovitis of wrist 12/24/2013  . Ganglion cyst of wrist 12/24/2013  . Hypotension, postural 11/22/2013  . Psoriasis   . Skin cancer      Vital Signs: BP 130/70   Pulse (!) 57   Temp 98 F (36.7 C)   Resp 16   Ht 5\' 9"  (1.753 m)   Wt 198 lb 9.6 oz (90.1 kg)   SpO2 96%   BMI 29.33 kg/m    Review of Systems  Constitutional: Negative for fatigue and fever.  HENT: Negative for congestion, mouth sores and postnasal drip.   Respiratory: Negative for cough.   Cardiovascular: Negative for chest pain.  Gastrointestinal: Negative for blood in stool, nausea and vomiting.  Genitourinary: Positive for pelvic pain and vaginal pain. Negative for difficulty urinating, dysuria, flank pain, frequency, vaginal bleeding and vaginal discharge.       Burning sensation in vagina, but not associated with urination and pelvic/vaginal pain intermittently  Musculoskeletal: Negative for back pain.  Psychiatric/Behavioral: Negative.     Physical Exam Constitutional:      General: She is not in acute distress.    Appearance: She is well-developed. She is not diaphoretic.  HENT:     Head: Normocephalic and atraumatic.     Mouth/Throat:     Pharynx: No oropharyngeal exudate.  Eyes:     Pupils: Pupils are equal, round,  and reactive to light.  Neck:     Thyroid: No thyromegaly.     Vascular: No JVD.     Trachea: No tracheal deviation.  Cardiovascular:     Rate and Rhythm: Normal rate and regular rhythm.     Heart sounds: Normal heart sounds. No murmur heard. No friction rub. No gallop.   Pulmonary:     Effort: Pulmonary effort is normal. No respiratory distress.     Breath sounds: No wheezing or rales.  Chest:     Chest wall: No tenderness.  Abdominal:     General: Bowel sounds are normal. There is no distension.     Palpations: Abdomen is soft. There is no mass.     Tenderness: There is no abdominal  tenderness. There is no guarding.  Musculoskeletal:        General: Normal range of motion.     Cervical back: Normal range of motion and neck supple.  Lymphadenopathy:     Cervical: No cervical adenopathy.  Skin:    General: Skin is warm and dry.  Neurological:     Mental Status: She is alert and oriented to person, place, and time.     Cranial Nerves: No cranial nerve deficit.  Psychiatric:        Behavior: Behavior normal.        Thought Content: Thought content normal.        Judgment: Judgment normal.       Assessment/Plan: 1. Pelvic pain Will obtain US for further evaluation. Counseled to call if any vaginal bleeding or worsening pain prior to [place] is done. - US Pelvic Complete With Transvaginal; Future  2. Vaginal burning Will obtain US, but pt may benefit from premarin cream for atrophic vaginitis depending on what US shows.  3. Dysuria - POCT Urinalysis Dipstick is negative today   General Counseling: Ruth Morales verbalizes understanding of the findings of todays visit and agrees with plan of treatment. I have discussed any further diagnostic evaluation that may be needed or ordered today. We also reviewed her medications today. she has been encouraged to call the office with any questions or concerns that should arise related to todays visit.    Counseling:    Orders Placed This Encounter  Procedures  . US Pelvic Complete With Transvaginal  . POCT Urinalysis Dipstick    No orders of the defined types were placed in this encounter.   Time spent:30 Minutes

## 2020-08-07 ENCOUNTER — Ambulatory Visit: Payer: 59

## 2020-08-07 LAB — TSH+FREE T4
Free T4: 1.08 ng/dL (ref 0.82–1.77)
TSH: 2.18 u[IU]/mL (ref 0.450–4.500)

## 2020-08-14 ENCOUNTER — Ambulatory Visit: Payer: BLUE CROSS/BLUE SHIELD | Admitting: Hospice and Palliative Medicine

## 2020-08-14 ENCOUNTER — Other Ambulatory Visit: Payer: Self-pay

## 2020-08-14 ENCOUNTER — Encounter: Payer: Self-pay | Admitting: Hospice and Palliative Medicine

## 2020-08-14 VITALS — BP 124/67 | HR 76 | Temp 97.8°F | Resp 16 | Ht 69.0 in | Wt 197.0 lb

## 2020-08-14 DIAGNOSIS — G479 Sleep disorder, unspecified: Secondary | ICD-10-CM

## 2020-08-14 DIAGNOSIS — R635 Abnormal weight gain: Secondary | ICD-10-CM | POA: Diagnosis not present

## 2020-08-14 NOTE — Progress Notes (Signed)
[facility] [place], [place] [address]  Internal MEDICINE  Office Visit Note  Patient Name: Ruth Morales  [id number]  [id number]  Date of Service: 08/14/2020  Chief Complaint  Patient presents with  . Anxiety  . Hypothyroidism    HPI Patient is here for routine follow-up Frustrated with her weight gain--feels that she eats healthy and is active most days and cannot seem to drop excess weight Started on low dose levothyroxine for subclinical hypothyroidism with symptoms of hair loss and fatigue Since starting medication she has noticed some improvement in her symptoms but wondering if medication is the cause of her weight gain Sleep is poor--has been an ongoing issues, wakes up most nights around 2 am and does not fall back asleep for at least an hour Has tried going to bed earlier but still wakes up at 2 am and cannot fall back to sleep for a while   Current Medication: Outpatient Encounter Medications as of 08/14/2020  Medication Sig  . Calcium Citrate-Vitamin D 315-250 MG-UNIT TABS Take by mouth.  . cyanocobalamin (,VITAMIN B-12,) 1000 MCG/ML injection Inject 1000 MCG/ML once a week for 3 weeks then once a month for three months.  Please include syringes  . ibuprofen (ADVIL,MOTRIN) 200 MG tablet Take 600 mg by mouth every 6 (six) hours as needed for headache or moderate pain.   Marland Kitchen levothyroxine (SYNTHROID) 25 MCG tablet Take 1 tablet (25 mcg total) by mouth daily before breakfast.  . loperamide (IMODIUM) 2 MG capsule Take 4 mg by mouth 2 (two) times daily.  . [DISCONTINUED] progesterone (PROMETRIUM) 100 MG capsule Take 1 capsule (100 mg total) by mouth daily.   No facility-administered encounter medications on file as of 08/14/2020.    Surgical History: Past Surgical History:  Procedure Laterality Date  . BREAST BIOPSY Left 2003   needle bx-neg  . BREAST SURGERY Bilateral 2004   breast reduction  . CATARACT EXTRACTION W/PHACO Left 02/06/2018    Procedure: CATARACT EXTRACTION PHACO AND INTRAOCULAR LENS PLACEMENT (IOC);  Surgeon: Birder Robson, MD;  Location: [facility] ORS;  Service: Ophthalmology;  Laterality: Left;  [place] 00:31.5 CDE 3.49 Fluid pack Lot # [id number]  . COLON SURGERY  2004   colon removed  . CTR    . EXTRACORPOREAL SHOCK WAVE LITHOTRIPSY Right 02/26/2015   Procedure: EXTRACORPOREAL SHOCK WAVE LITHOTRIPSY (ESWL);  Surgeon: Collier Flowers, MD;  Location: [facility] ORS;  Service: Urology;  Laterality: Right;  . REDUCTION MAMMAPLASTY Bilateral 2005    Medical History: Past Medical History:  Diagnosis Date  . Anxiety 11/21/2013  . Breath shortness 11/21/2013  . Crohn disease ([place])   . Dizziness 11/21/2013  . Extensor tenosynovitis of wrist 12/24/2013  . Ganglion cyst of wrist 12/24/2013  . Hypotension, postural 11/22/2013  . Psoriasis   . Skin cancer   . Thyroid disease     Family History: Family History  Problem Relation Age of Onset  . Diabetes Father   . Diabetes Mother   . Heart disease Maternal Grandmother   . Breast cancer Neg Hx     Social History   Socioeconomic History  . Marital status: Married    Spouse name: Not on file  . Number of children: Not on file  . Years of education: Not on file  . Highest education level: Not on file  Occupational History  . Not on file  Tobacco Use  . Smoking status: Former Research scientist (life sciences)  . Smokeless tobacco: Never Used  Vaping Use  .  Vaping Use: Never used  Substance and Sexual Activity  . Alcohol use: Yes    Alcohol/week: 1.0 standard drink    Types: 1 Standard drinks or equivalent per week    Comment: occasional drink, once a month  . Drug use: No  . Sexual activity: Yes    Partners: Male  Other Topics Concern  . Not on file  Social History Narrative  . Not on file   Social Determinants of Health   Financial Resource Strain: Not on file  Food Insecurity: Not on file  Transportation Needs: Not on file  Physical Activity: Not on file  Stress: Not on file   Social Connections: Not on file  Intimate Partner Violence: Not on file      Review of Systems  Constitutional: Negative for chills, diaphoresis and fatigue.  HENT: Negative for ear pain, postnasal drip and sinus pressure.   Eyes: Negative for photophobia, discharge, redness, itching and visual disturbance.  Respiratory: Negative for cough, shortness of breath and wheezing.   Cardiovascular: Negative for chest pain, palpitations and leg swelling.  Gastrointestinal: Negative for abdominal pain, constipation, diarrhea, nausea and vomiting.  Genitourinary: Negative for dysuria and flank pain.  Musculoskeletal: Negative for arthralgias, back pain, gait problem and neck pain.  Skin: Negative for color change.  Allergic/Immunologic: Negative for environmental allergies and food allergies.  Neurological: Negative for dizziness and headaches.  Hematological: Does not bruise/bleed easily.  Psychiatric/Behavioral: Positive for sleep disturbance. Negative for agitation, behavioral problems (depression) and hallucinations.    Vital Signs: BP 124/67   Pulse 76   Temp 97.8 F (36.6 C)   Resp 16   Ht 5\' 9"  (1.753 m)   Wt 197 lb (89.4 kg)   SpO2 94%   BMI 29.09 kg/m    Physical Exam Vitals reviewed.  Constitutional:      Appearance: Normal appearance. She is obese.  Cardiovascular:     Rate and Rhythm: Normal rate and regular rhythm.     Pulses: Normal pulses.     Heart sounds: Normal heart sounds.  Pulmonary:     Effort: Pulmonary effort is normal.     Breath sounds: Normal breath sounds.  Abdominal:     General: Abdomen is flat.     Palpations: Abdomen is soft.  Musculoskeletal:        General: Normal range of motion.     Cervical back: Normal range of motion.  Skin:    General: Skin is warm.  Neurological:     General: No focal deficit present.     Mental Status: She is alert and oriented to person, place, and time. Mental status is at baseline.  Psychiatric:         Mood and Affect: Mood normal.        Behavior: Behavior normal.        Thought Content: Thought content normal.        Judgment: Judgment normal.    Assessment/Plan: 1. Sleep disturbance Encouraged to trail Melatonin extended release to help rest circadian cycle as she has difficulty staying asleep throughout the night Consider PSG if symptoms do not improve  2. Abnormal weight gain Trial discontinuing low dose levothyroxine and monitor for changes with her weight Consider starting medication for weight loss assistance at next visit if she continues to struggle with weight loss  General Counseling: Britanie verbalizes understanding of the findings of todays visit and agrees with plan of treatment. I have discussed any further diagnostic evaluation that may be  needed or ordered today. We also reviewed her medications today. she has been encouraged to call the office with any questions or concerns that should arise related to todays visit.   Time spent:30 Minutes Time spent includes review of chart, medications, test results and follow-up plan with the patient.  This patient was seen by Theodoro Grist AGNP-C in Collaboration with Dr Lavera Guise as a part of collaborative care agreement     Tanna Furry.  AGNP-C Internal medicine

## 2020-08-28 ENCOUNTER — Ambulatory Visit: Payer: BLUE CROSS/BLUE SHIELD

## 2020-09-30 ENCOUNTER — Telehealth: Payer: Self-pay

## 2020-09-30 NOTE — Telephone Encounter (Signed)
error 

## 2020-10-01 ENCOUNTER — Ambulatory Visit: Payer: BLUE CROSS/BLUE SHIELD | Attending: Physician Assistant

## 2020-10-14 ENCOUNTER — Ambulatory Visit: Payer: BLUE CROSS/BLUE SHIELD | Admitting: Nurse Practitioner

## 2020-11-18 ENCOUNTER — Ambulatory Visit: Payer: BLUE CROSS/BLUE SHIELD | Admitting: Nurse Practitioner

## 2020-11-18 ENCOUNTER — Other Ambulatory Visit: Payer: Self-pay

## 2020-11-18 ENCOUNTER — Encounter: Payer: Self-pay | Admitting: Nurse Practitioner

## 2020-11-18 VITALS — BP 124/86 | HR 61 | Temp 98.2°F | Resp 16 | Ht 69.0 in | Wt 202.0 lb

## 2020-11-18 DIAGNOSIS — N952 Postmenopausal atrophic vaginitis: Secondary | ICD-10-CM

## 2020-11-18 DIAGNOSIS — Z6829 Body mass index (BMI) 29.0-29.9, adult: Secondary | ICD-10-CM | POA: Diagnosis not present

## 2020-11-18 DIAGNOSIS — R001 Bradycardia, unspecified: Secondary | ICD-10-CM

## 2020-11-18 DIAGNOSIS — F419 Anxiety disorder, unspecified: Secondary | ICD-10-CM

## 2020-11-18 MED ORDER — ESTROGENS, CONJUGATED 0.625 MG/GM VA CREA: 1.0000 | TOPICAL_CREAM | Freq: Every day | VAGINAL | 2 refills | Status: DC

## 2020-11-18 NOTE — Progress Notes (Signed)
[facility] [place], [place] [address]  Internal MEDICINE  Office Visit Note  Patient Name: Ruth Morales  [id number]  [id number]  Date of Service: 11/30/2020  Chief Complaint  Patient presents with   Follow-up   Anxiety   Shortness of Breath    HPI Padma presents for a follow up visit for anxiety, shortness of breath and vaginal burning pain. Her anxiety is well controlled at this time. Pelvic ultrasound was normal, ovaries not visualized. Denies any vaginal itching or discharge. Requesting help with weight loss management.    Current Medication: Outpatient Encounter Medications as of 11/18/2020  Medication Sig   Calcium Citrate-Vitamin D 315-250 MG-UNIT TABS Take by mouth.   conjugated estrogens (PREMARIN) vaginal cream Place 1 Applicatorful vaginally at bedtime. For 2 weeks and then can decrease frequency to twice weekly.   cyanocobalamin (,VITAMIN B-12,) 1000 MCG/ML injection Inject 1000 MCG/ML once a week for 3 weeks then once a month for three months.  Please include syringes   ibuprofen (ADVIL,MOTRIN) 200 MG tablet Take 600 mg by mouth every 6 (six) hours as needed for headache or moderate pain.    loperamide (IMODIUM) 2 MG capsule Take 4 mg by mouth 2 (two) times daily.   levothyroxine (SYNTHROID) 25 MCG tablet Take 1 tablet (25 mcg total) by mouth daily before breakfast. (Patient not taking: Reported on 11/18/2020)   [DISCONTINUED] progesterone (PROMETRIUM) 100 MG capsule Take 1 capsule (100 mg total) by mouth daily.   No facility-administered encounter medications on file as of 11/18/2020.    Surgical History: Past Surgical History:  Procedure Laterality Date   BREAST BIOPSY Left 2003   needle bx-neg   BREAST SURGERY Bilateral 2004   breast reduction   CATARACT EXTRACTION W/PHACO Left 02/06/2018   Procedure: CATARACT EXTRACTION PHACO AND INTRAOCULAR LENS PLACEMENT ([place]);  Surgeon: Birder Robson, MD;  Location: [facility] ORS;  Service:  Ophthalmology;  Laterality: Left;  [place] 00:31.5 CDE 3.49 Fluid pack Lot # [id number]   COLON SURGERY  2004   colon removed   CTR     EXTRACORPOREAL SHOCK WAVE LITHOTRIPSY Right 02/26/2015   Procedure: EXTRACORPOREAL SHOCK WAVE LITHOTRIPSY (ESWL);  Surgeon: Collier Flowers, MD;  Location: [facility] ORS;  Service: Urology;  Laterality: Right;   REDUCTION MAMMAPLASTY Bilateral 2005    Medical History: Past Medical History:  Diagnosis Date   Anxiety 11/21/2013   Breath shortness 11/21/2013   Crohn disease ([place])    Dizziness 11/21/2013   Extensor tenosynovitis of wrist 12/24/2013   Ganglion cyst of wrist 12/24/2013   Hypotension, postural 11/22/2013   Psoriasis    Skin cancer    Thyroid disease     Family History: Family History  Problem Relation Age of Onset   Diabetes Father    Diabetes Mother    Heart disease Maternal Grandmother    Breast cancer Neg Hx     Social History   Socioeconomic History   Marital status: Married    Spouse name: Not on file   Number of children: Not on file   Years of education: Not on file   Highest education level: Not on file  Occupational History   Not on file  Tobacco Use   Smoking status: Former   Smokeless tobacco: Never  Vaping Use   Vaping Use: Never used  Substance and Sexual Activity   Alcohol use: Yes    Alcohol/week: 1.0 standard drink    Types: 1 Standard drinks or equivalent per week  Comment: occasional drink, once a month   Drug use: No   Sexual activity: Yes    Partners: Male  Other Topics Concern   Not on file  Social History Narrative   Not on file   Social Determinants of Health   Financial Resource Strain: Not on file  Food Insecurity: Not on file  Transportation Needs: Not on file  Physical Activity: Not on file  Stress: Not on file  Social Connections: Not on file  Intimate Partner Violence: Not on file      Review of Systems  Constitutional:  Negative for chills, fatigue and unexpected weight change.   HENT:  Positive for postnasal drip. Negative for congestion, rhinorrhea, sneezing and sore throat.   Eyes:  Negative for redness.  Respiratory: Negative.  Negative for cough, chest tightness, shortness of breath and wheezing.   Cardiovascular: Negative.  Negative for chest pain and palpitations.  Gastrointestinal:  Negative for abdominal pain, constipation, diarrhea, nausea and vomiting.  Genitourinary:  Positive for difficulty urinating (burning) and vaginal pain. Negative for dysuria, frequency, pelvic pain, vaginal bleeding and vaginal discharge.       Denies vaginal itching or odor.  Musculoskeletal:  Negative for arthralgias, back pain, joint swelling and neck pain.  Skin:  Negative for rash.  Neurological: Negative.  Negative for tremors and numbness.  Hematological:  Negative for adenopathy. Does not bruise/bleed easily.  Psychiatric/Behavioral:  Negative for behavioral problems (Depression), sleep disturbance and suicidal ideas. The patient is not nervous/anxious.    Vital Signs: BP 124/86   Pulse 61 Comment: 57  Temp 98.2 F (36.8 C)   Resp 16   Ht '5\' 9"'$  (1.753 m)   Wt 202 lb (91.6 kg)   SpO2 98%   BMI 29.83 kg/m    Physical Exam Vitals reviewed.  Constitutional:      General: She is not in acute distress.    Appearance: Normal appearance. She is obese. She is not ill-appearing.  HENT:     Head: Normocephalic and atraumatic.  Cardiovascular:     Rate and Rhythm: Normal rate and regular rhythm.  Pulmonary:     Effort: Pulmonary effort is normal. No respiratory distress.  Skin:    General: Skin is warm and dry.     Capillary Refill: Capillary refill takes less than 2 seconds.  Neurological:     Mental Status: She is alert and oriented to person, place, and time.  Psychiatric:        Mood and Affect: Mood normal.        Behavior: Behavior normal.     Assessment/Plan: 1. Atrophic vaginitis Vaginal cream prescribed, have copay card available if needed.  -  conjugated estrogens (PREMARIN) vaginal cream; Place 1 Applicatorful vaginally at bedtime. Daily For 2 weeks and then can decrease frequency to twice weekly.  Dispense: 30 g; Refill: 2  2. BMI 29.0-29.9,adult Follow up in 4 weeks for metabolic test and to discuss diet and lifestyle modifications.  3. Anxiety Stable, does not want to try medications at this time.    General Counseling: Rashya verbalizes understanding of the findings of todays visit and agrees with plan of treatment. I have discussed any further diagnostic evaluation that may be needed or ordered today. We also reviewed her medications today. she has been encouraged to call the office with any questions or concerns that should arise related to todays visit.    No orders of the defined types were placed in this encounter.   Meds ordered  this encounter  Medications   conjugated estrogens (PREMARIN) vaginal cream    Sig: Place 1 Applicatorful vaginally at bedtime. For 2 weeks and then can decrease frequency to twice weekly.    Dispense:  30 g    Refill:  2    Return in about 4 weeks (around 12/16/2020) for F/U, Weight loss,  PCP.   Total time spent:20 Minutes Time spent includes review of chart, medications, test results, and follow up plan with the patient.   [place] Controlled Substance Database was reviewed by me.  This patient was seen by Jonetta Osgood, FNP-C in collaboration with Dr. Clayborn Bigness as a part of collaborative care agreement.    R. Valetta Fuller, MSN, FNP-C Internal medicine

## 2020-11-20 ENCOUNTER — Telehealth: Payer: Self-pay

## 2020-11-30 ENCOUNTER — Telehealth: Payer: Self-pay

## 2020-11-30 NOTE — Telephone Encounter (Signed)
Spoke with pt, informed her of coupon card for premarin. Pt is out of town, but her daughter has picked it up. Pt will be back around Wednesday and will use the card at the pharmacy for med.

## 2020-11-30 NOTE — Telephone Encounter (Signed)
Pt will use coupon card for premarin at pharmacy Wednesday when she is back in town

## 2020-12-16 ENCOUNTER — Ambulatory Visit: Payer: BLUE CROSS/BLUE SHIELD | Admitting: Nurse Practitioner

## 2021-03-18 ENCOUNTER — Encounter: Payer: BLUE CROSS/BLUE SHIELD | Admitting: Nurse Practitioner

## 2021-05-06 ENCOUNTER — Other Ambulatory Visit: Payer: Self-pay

## 2021-05-06 ENCOUNTER — Other Ambulatory Visit
Admission: RE | Admit: 2021-05-06 | Discharge: 2021-05-06 | Disposition: A | Discharge status: Home / Self Care | Payer: 59 | Attending: Nurse Practitioner | Admitting: Nurse Practitioner

## 2021-05-06 ENCOUNTER — Ambulatory Visit (INDEPENDENT_AMBULATORY_CARE_PROVIDER_SITE_OTHER): Payer: 59 | Admitting: Nurse Practitioner

## 2021-05-06 ENCOUNTER — Encounter: Payer: Self-pay | Admitting: Nurse Practitioner

## 2021-05-06 VITALS — BP 130/70 | HR 88 | Temp 98.4°F | Ht 69.0 in | Wt 209.6 lb

## 2021-05-06 DIAGNOSIS — E559 Vitamin D deficiency, unspecified: Secondary | ICD-10-CM | POA: Diagnosis not present

## 2021-05-06 DIAGNOSIS — I10 Essential (primary) hypertension: Secondary | ICD-10-CM | POA: Diagnosis not present

## 2021-05-06 DIAGNOSIS — E782 Mixed hyperlipidemia: Secondary | ICD-10-CM

## 2021-05-06 DIAGNOSIS — R5383 Other fatigue: Secondary | ICD-10-CM

## 2021-05-06 DIAGNOSIS — E538 Deficiency of other specified B group vitamins: Secondary | ICD-10-CM | POA: Diagnosis not present

## 2021-05-06 DIAGNOSIS — R300 Dysuria: Secondary | ICD-10-CM

## 2021-05-06 DIAGNOSIS — K219 Gastro-esophageal reflux disease without esophagitis: Secondary | ICD-10-CM | POA: Diagnosis not present

## 2021-05-06 DIAGNOSIS — Z0001 Encounter for general adult medical examination with abnormal findings: Secondary | ICD-10-CM

## 2021-05-06 DIAGNOSIS — Z1231 Encounter for screening mammogram for malignant neoplasm of breast: Secondary | ICD-10-CM

## 2021-05-06 DIAGNOSIS — G2581 Restless legs syndrome: Secondary | ICD-10-CM | POA: Diagnosis not present

## 2021-05-06 LAB — CBC WITH DIFFERENTIAL/PLATELET
Abs Immature Granulocytes: 0.01 10*3/uL (ref 0.00–0.07)
Basophils Absolute: 0.1 10*3/uL (ref 0.0–0.1)
Basophils Relative: 1 %
Eosinophils Absolute: 0.1 10*3/uL (ref 0.0–0.5)
Eosinophils Relative: 2 %
HCT: 38.2 % (ref 36.0–46.0)
Hemoglobin: 12.6 g/dL (ref 12.0–15.0)
Immature Granulocytes: 0 %
Lymphocytes Relative: 25 %
Lymphs Abs: 1.2 10*3/uL (ref 0.7–4.0)
MCH: 30.2 pg (ref 26.0–34.0)
MCHC: 33 g/dL (ref 30.0–36.0)
MCV: 91.6 fL (ref 80.0–100.0)
Monocytes Absolute: 0.4 10*3/uL (ref 0.1–1.0)
Monocytes Relative: 8 %
Neutro Abs: 3.3 10*3/uL (ref 1.7–7.7)
Neutrophils Relative %: 64 %
Platelets: 170 10*3/uL (ref 150–400)
RBC: 4.17 MIL/uL (ref 3.87–5.11)
RDW: 13.1 % (ref 11.5–15.5)
WBC: 5 10*3/uL (ref 4.0–10.5)
nRBC: 0 % (ref 0.0–0.2)

## 2021-05-06 LAB — LIPID PANEL
Cholesterol: 225 mg/dL — ABNORMAL HIGH (ref 0–200)
HDL: 84 mg/dL (ref >40)
LDL Cholesterol: 125 mg/dL — ABNORMAL HIGH (ref 0–99)
Total CHOL/HDL Ratio: 2.7 RATIO
Triglycerides: 80 mg/dL (ref <150)
VLDL: 16 mg/dL (ref 0–40)

## 2021-05-06 LAB — IRON AND TIBC
Iron: 76 ug/dL (ref 28–170)
Saturation Ratios: 19 % (ref 10.4–31.8)
TIBC: 400 ug/dL (ref 250–450)
UIBC: 324 ug/dL

## 2021-05-06 LAB — COMPREHENSIVE METABOLIC PANEL
ALT: 28 U/L (ref 0–44)
AST: 32 U/L (ref 15–41)
Albumin: 4.4 g/dL (ref 3.5–5.0)
Alkaline Phosphatase: 57 U/L (ref 38–126)
Anion gap: 8 (ref 5–15)
BUN: 23 mg/dL (ref 8–23)
CO2: 25 mmol/L (ref 22–32)
Calcium: 9.5 mg/dL (ref 8.9–10.3)
Chloride: 104 mmol/L (ref 98–111)
Creatinine, Ser: 0.85 mg/dL (ref 0.44–1.00)
GFR, Estimated: >60 mL/min (ref >60)
Glucose, Bld: 105 mg/dL — ABNORMAL HIGH (ref 70–99)
Potassium: 4.3 mmol/L (ref 3.5–5.1)
Sodium: 137 mmol/L (ref 135–145)
Total Bilirubin: 0.6 mg/dL (ref 0.3–1.2)
Total Protein: 7.6 g/dL (ref 6.5–8.1)

## 2021-05-06 LAB — MAGNESIUM: Magnesium: 2 mg/dL (ref 1.7–2.4)

## 2021-05-06 LAB — TSH: TSH: 2.741 u[IU]/mL (ref 0.350–4.500)

## 2021-05-06 LAB — T4, FREE: Free T4: 0.92 ng/dL (ref 0.61–1.12)

## 2021-05-06 LAB — FOLATE: Folate: 18.7 ng/mL (ref >5.9)

## 2021-05-06 LAB — VITAMIN B12: Vitamin B-12: >7500 pg/mL — ABNORMAL HIGH (ref 180–914)

## 2021-05-06 LAB — VITAMIN D 25 HYDROXY (VIT D DEFICIENCY, FRACTURES): Vit D, 25-Hydroxy: 19.4 ng/mL — ABNORMAL LOW (ref 30–100)

## 2021-05-06 LAB — FERRITIN: Ferritin: 109 ng/mL (ref 11–307)

## 2021-05-06 MED ORDER — CYANOCOBALAMIN 1000 MCG/ML IJ SOLN
1000.0000 ug | Freq: Once | INTRAMUSCULAR | Status: AC
  Administered 2021-05-06: 1000 ug via INTRAMUSCULAR

## 2021-05-06 NOTE — Progress Notes (Signed)
[facility] [place], [place] [address]  Internal MEDICINE  Office Visit Note  Patient Name: Ruth Morales  [id number]  [id number]  Date of Service: 05/06/2021  Chief Complaint  Patient presents with   Annual Exam   Anxiety   Leg Problem    Feels like she has restless legs at night - in both legs, both legs make jerking motions, has been going on for a few months    HPI Ruth Morales presents for an annual well visit and physical exam. She is a well appearing 61 yo female with anxiety, hypertension and psoriasis. She has been dealing with restless legs recently and wants to know how to improve this.  She had her colon removed in 2004 due to severe ulcerative colitis. Goes to GI doctor regularly.  She is due for routine labs and routine mammogram. She wants a B12 injection as well.  Pap is due in 2024. She had a recent endoscopy with her GI doctor. She does not need any refills. She denies any pain and has no other concerns or questions.    Current Medication: Outpatient Encounter Medications as of 05/06/2021  Medication Sig   Calcium Citrate-Vitamin D 315-250 MG-UNIT TABS Take by mouth.   cyanocobalamin (,VITAMIN B-12,) 1000 MCG/ML injection Inject 1000 MCG/ML once a week for 3 weeks then once a month for three months.  Please include syringes   ibuprofen (ADVIL,MOTRIN) 200 MG tablet Take 600 mg by mouth every 6 (six) hours as needed for headache or moderate pain.    loperamide (IMODIUM) 2 MG capsule Take 4 mg by mouth 2 (two) times daily.   [DISCONTINUED] conjugated estrogens (PREMARIN) vaginal cream Place 1 Applicatorful vaginally at bedtime. For 2 weeks and then can decrease frequency to twice weekly.   [DISCONTINUED] levothyroxine (SYNTHROID) 25 MCG tablet Take 1 tablet (25 mcg total) by mouth daily before breakfast.   [DISCONTINUED] progesterone (PROMETRIUM) 100 MG capsule Take 1 capsule (100 mg total) by mouth daily.   [EXPIRED] cyanocobalamin ((VITAMIN B-12))  injection 1,000 mcg    No facility-administered encounter medications on file as of 05/06/2021.    Surgical History: Past Surgical History:  Procedure Laterality Date   BREAST BIOPSY Left 2003   needle bx-neg   BREAST SURGERY Bilateral 2004   breast reduction   CATARACT EXTRACTION W/PHACO Left 02/06/2018   Procedure: CATARACT EXTRACTION PHACO AND INTRAOCULAR LENS PLACEMENT ([place]);  Surgeon: Birder Robson, MD;  Location: [facility] ORS;  Service: Ophthalmology;  Laterality: Left;  [place] 00:31.5 CDE 3.49 Fluid pack Lot # [id number]   COLON SURGERY  2004   colon removed   CTR     EXTRACORPOREAL SHOCK WAVE LITHOTRIPSY Right 02/26/2015   Procedure: EXTRACORPOREAL SHOCK WAVE LITHOTRIPSY (ESWL);  Surgeon: Collier Flowers, MD;  Location: [facility] ORS;  Service: Urology;  Laterality: Right;   REDUCTION MAMMAPLASTY Bilateral 2005    Medical History: Past Medical History:  Diagnosis Date   Anxiety 11/21/2013   Breath shortness 11/21/2013   Crohn disease ([place])    Dizziness 11/21/2013   Extensor tenosynovitis of wrist 12/24/2013   Ganglion cyst of wrist 12/24/2013   Hypotension, postural 11/22/2013   Psoriasis    Skin cancer    Thyroid disease     Family History: Family History  Problem Relation Age of Onset   Diabetes Father    Diabetes Mother    Heart disease Maternal Grandmother    Breast cancer Neg Hx     Social History   Socioeconomic  History   Marital status: Married    Spouse name: Not on file   Number of children: Not on file   Years of education: Not on file   Highest education level: Not on file  Occupational History   Not on file  Tobacco Use   Smoking status: Former   Smokeless tobacco: Never  Vaping Use   Vaping Use: Never used  Substance and Sexual Activity   Alcohol use: Yes    Alcohol/week: 1.0 standard drink    Types: 1 Standard drinks or equivalent per week    Comment: occasional drink, once a month   Drug use: No   Sexual activity: Yes    Partners: Male  Other  Topics Concern   Not on file  Social History Narrative   Not on file   Social Determinants of Health   Financial Resource Strain: Not on file  Food Insecurity: Not on file  Transportation Needs: Not on file  Physical Activity: Not on file  Stress: Not on file  Social Connections: Not on file  Intimate Partner Violence: Not on file      Review of Systems  Constitutional:  Negative for activity change, appetite change, chills, fatigue, fever and unexpected weight change.  HENT: Negative.  Negative for congestion, ear pain, rhinorrhea, sore throat and trouble swallowing.   Eyes: Negative.   Respiratory: Negative.  Negative for cough, chest tightness, shortness of breath and wheezing.   Cardiovascular: Negative.  Negative for chest pain.  Gastrointestinal: Negative.  Negative for abdominal pain, blood in stool, constipation, diarrhea, nausea and vomiting.  Endocrine: Negative.   Genitourinary: Negative.  Negative for difficulty urinating, dysuria, frequency, hematuria and urgency.  Musculoskeletal: Negative.  Negative for arthralgias, back pain, joint swelling, myalgias and neck pain.       Restless legs  Skin: Negative.  Negative for rash and wound.  Allergic/Immunologic: Negative.  Negative for immunocompromised state.  Neurological: Negative.  Negative for dizziness, seizures, numbness and headaches.  Hematological: Negative.   Psychiatric/Behavioral: Negative.  Negative for behavioral problems, self-injury and suicidal ideas. The patient is not nervous/anxious.    Vital Signs: BP 130/70    Pulse 88    Temp 98.4 F (36.9 C)    Ht _0  (1.753 m)    Wt 209 lb 9.6 oz (95.1 kg)    SpO2 97%    BMI 30.95 kg/m    Physical Exam Vitals reviewed.  Constitutional:      General: She is awake. She is not in acute distress.    Appearance: Normal appearance. She is well-developed and well-groomed. She is obese. She is not ill-appearing or diaphoretic.  HENT:     Head: Normocephalic  and atraumatic.     Right Ear: Tympanic membrane, ear canal and external ear normal.     Left Ear: Tympanic membrane, ear canal and external ear normal.     Nose: Nose normal. No congestion or rhinorrhea.     Mouth/Throat:     Lips: Pink.     Mouth: Mucous membranes are moist.     Pharynx: Oropharynx is clear. Uvula midline. No oropharyngeal exudate or posterior oropharyngeal erythema.  Eyes:     General: Lids are normal. Vision grossly intact. Gaze aligned appropriately. No scleral icterus.       Right eye: No discharge.        Left eye: No discharge.     Extraocular Movements: Extraocular movements intact.     Conjunctiva/sclera: Conjunctivae normal.  Pupils: Pupils are equal, round, and reactive to light.     Funduscopic exam:    Right eye: Red reflex present.        Left eye: Red reflex present. Neck:     Thyroid: No thyromegaly.     Vascular: No carotid bruit or JVD.     Trachea: Trachea and phonation normal. No tracheal deviation.  Cardiovascular:     Rate and Rhythm: Normal rate and regular rhythm.     Pulses: Normal pulses.     Heart sounds: Normal heart sounds, S1 normal and S2 normal. No murmur heard.   No friction rub. No gallop.  Pulmonary:     Effort: Pulmonary effort is normal. No accessory muscle usage or respiratory distress.     Breath sounds: Normal breath sounds and air entry. No stridor. No wheezing or rales.  Chest:     Chest wall: No tenderness.  Breasts:    Right: Normal. No swelling, bleeding, inverted nipple, mass, nipple discharge, skin change or tenderness.     Left: Normal. No swelling, bleeding, inverted nipple, mass, nipple discharge, skin change or tenderness.  Abdominal:     General: Bowel sounds are normal. There is no distension.     Palpations: Abdomen is soft. There is no shifting dullness, fluid wave, mass or pulsatile mass.     Tenderness: There is no abdominal tenderness. There is no guarding or rebound.  Musculoskeletal:         General: No tenderness or deformity. Normal range of motion.     Cervical back: Normal range of motion and neck supple.     Right lower leg: No edema.     Left lower leg: No edema.  Lymphadenopathy:     Cervical: No cervical adenopathy.     Upper Body:     Right upper body: No supraclavicular, axillary or pectoral adenopathy.     Left upper body: No supraclavicular, axillary or pectoral adenopathy.  Skin:    General: Skin is warm and dry.     Capillary Refill: Capillary refill takes less than 2 seconds.     Coloration: Skin is not pale.     Findings: No erythema or rash.  Neurological:     Mental Status: She is alert and oriented to person, place, and time.     Cranial Nerves: No cranial nerve deficit.     Motor: No abnormal muscle tone.     Coordination: Coordination normal.     Gait: Gait normal.     Deep Tendon Reflexes: Reflexes are normal and symmetric.  Psychiatric:        Mood and Affect: Mood normal.        Behavior: Behavior normal. Behavior is cooperative.        Thought Content: Thought content normal.        Judgment: Judgment normal.       Assessment/Plan: 1. Encounter for routine adult health examination with abnormal findings Age-appropriate preventive screenings and vaccinations discussed, annual physical exam completed. Routine labs for health maintenance ordered, see below. PHM updated.  - CMP14+EGFR - TSH + free T4  2. Essential hypertension Stable, continue medications as prescribed.   3. Restless legs Routine labs ordered, discussed magnesium supplement with patient and labs ordered to rule out anemia or iron deficiency.  - CBC with Differential/Platelet - Iron, TIBC and Ferritin Panel - B12 and Folate Panel - Vitamin D (25 hydroxy) - Lipid Profile - CMP14+EGFR - TSH + free T4 - Magnesium  4. Other fatigue Routine labs ordered - CBC with Differential/Platelet - Iron, TIBC and Ferritin Panel - B12 and Folate Panel - Vitamin D (25 hydroxy) -  Lipid Profile - CMP14+EGFR - TSH + free T4 - Magnesium  5. Mixed hyperlipidemia Routine lab ordered - Lipid Profile  6. Vitamin B12 deficiency Routine labs ordered, B12 injection administered in office today - CBC with Differential/Platelet - Iron, TIBC and Ferritin Panel - B12 and Folate Panel - cyanocobalamin ((VITAMIN B-12)) injection 1,000 mcg  7. Vitamin D deficiency Routine lab ordered - Vitamin D (25 hydroxy)  8. Gastroesophageal reflux disease without esophagitis Stable, continue medications as prescribed  9. Dysuria Routine urinalysis done - UA/M w/rflx Culture, Routine - Microscopic Examination - Urine Culture, Reflex  10. Encounter for screening mammogram for malignant neoplasm of breast Routine mammogram ordered - MM 3D SCREEN BREAST BILATERAL; Future      General Counseling: Ruth Morales verbalizes understanding of the findings of todays visit and agrees with plan of treatment. I have discussed any further diagnostic evaluation that may be needed or ordered today. We also reviewed her medications today. she has been encouraged to call the office with any questions or concerns that should arise related to todays visit.    Orders Placed This Encounter  Procedures   Microscopic Examination   Urine Culture, Reflex   MM 3D SCREEN BREAST BILATERAL   CBC with Differential/Platelet   Iron, TIBC and Ferritin Panel   B12 and Folate Panel   Vitamin D (25 hydroxy)   Lipid Profile   CMP14+EGFR   TSH + free T4   Magnesium   UA/M w/rflx Culture, Routine    Meds ordered this encounter  Medications   cyanocobalamin ((VITAMIN B-12)) injection 1,000 mcg    Return in about 6 months (around 11/03/2021) for F/U,  PCP.   Total time spent:30 Minutes Time spent includes review of chart, medications, test results, and follow up plan with the patient.   [place] Controlled Substance Database was reviewed by me.  This patient was seen by Jonetta Osgood, FNP-C in  collaboration with Dr. Clayborn Bigness as a part of collaborative care agreement.   R. Valetta Fuller, MSN, FNP-C Internal medicine

## 2021-05-07 ENCOUNTER — Encounter: Payer: Self-pay | Admitting: Nurse Practitioner

## 2021-05-09 LAB — UA/M W/RFLX CULTURE, ROUTINE
Bilirubin, UA: NEGATIVE
Glucose, UA: NEGATIVE
Ketones, UA: NEGATIVE
Nitrite, UA: NEGATIVE
Protein,UA: NEGATIVE
RBC, UA: NEGATIVE
Specific Gravity, UA: 1.023 (ref 1.005–1.030)
Urobilinogen, Ur: 0.2 mg/dL (ref 0.2–1.0)
pH, UA: 5 (ref 5.0–7.5)

## 2021-05-09 LAB — URINE CULTURE, REFLEX

## 2021-05-09 LAB — MICROSCOPIC EXAMINATION
Bacteria, UA: NONE SEEN
Casts: NONE SEEN /lpf
RBC, Urine: NONE SEEN /hpf (ref 0–2)

## 2021-05-10 ENCOUNTER — Other Ambulatory Visit: Payer: Self-pay

## 2021-05-10 ENCOUNTER — Telehealth: Payer: Self-pay

## 2021-05-10 MED ORDER — ERGOCALCIFEROL 1.25 MG (50000 UT) PO CAPS: 50000.0000 [IU] | ORAL_CAPSULE | ORAL | 5 refills | Status: DC

## 2021-05-10 NOTE — Progress Notes (Signed)
Please call patient with results:  -lipid panel is improving with total cholesteral and LDL decreasing.  -metabolic panel is normal, kidney and liver function are normal -thyroid levels are normal -CBC is normal, iron studies, ferritin and folate are also normal -vitamin D is significantly low, recommend prescription supplement, please send vitamin D 50,000 capsule, take 1 capsule by mouth weekly to her pharmacy.  --can stop taking B12 supplements for now, level is high- this is not harmful but if she wants to take a break from her supplement, that is fine.

## 2021-05-11 NOTE — Telephone Encounter (Signed)
Spoke to pt, informed her of results, sent in Vitamin D to pharmacy

## 2021-07-06 ENCOUNTER — Inpatient Hospital Stay: Admission: RE | Admit: 2021-07-06 | Payer: BLUE CROSS/BLUE SHIELD | Source: Ambulatory Visit

## 2021-08-23 ENCOUNTER — Ambulatory Visit (INDEPENDENT_AMBULATORY_CARE_PROVIDER_SITE_OTHER): Payer: 59 | Admitting: Nurse Practitioner

## 2021-08-23 ENCOUNTER — Encounter: Payer: Self-pay | Admitting: Nurse Practitioner

## 2021-08-23 VITALS — BP 140/80 | HR 71 | Temp 98.3°F | Resp 16 | Ht 68.5 in | Wt 206.4 lb

## 2021-08-23 DIAGNOSIS — M2550 Pain in unspecified joint: Secondary | ICD-10-CM | POA: Diagnosis not present

## 2021-08-23 DIAGNOSIS — R7301 Impaired fasting glucose: Secondary | ICD-10-CM

## 2021-08-23 DIAGNOSIS — G8929 Other chronic pain: Secondary | ICD-10-CM

## 2021-08-23 DIAGNOSIS — M2560 Stiffness of unspecified joint, not elsewhere classified: Secondary | ICD-10-CM

## 2021-08-23 DIAGNOSIS — L409 Psoriasis, unspecified: Secondary | ICD-10-CM

## 2021-08-23 DIAGNOSIS — E559 Vitamin D deficiency, unspecified: Secondary | ICD-10-CM

## 2021-08-23 LAB — POCT GLYCOSYLATED HEMOGLOBIN (HGB A1C): Hemoglobin A1C: 5.5 % (ref 4.0–5.6)

## 2021-08-23 MED ORDER — ERGOCALCIFEROL 1.25 MG (50000 UT) PO CAPS: 50000.0000 [IU] | ORAL_CAPSULE | ORAL | 1 refills | Status: DC

## 2021-08-23 MED ORDER — MELOXICAM 15 MG PO TABS
15.0000 mg | ORAL_TABLET | Freq: Every day | ORAL | 2 refills | Status: DC
Start: 2021-08-23 — End: 2022-05-10

## 2021-08-23 NOTE — Progress Notes (Signed)
[facility] [place], [place] [address]  Internal MEDICINE  Office Visit Note  Patient Name: Ruth Morales  [id number]  [id number]  Date of Service: 08/23/2021  Chief Complaint  Patient presents with   Acute Visit    Aching joints    Hypertension     HPI Patrese presents for an acute sick visit for sore and aching joints and hypertension.    She has had increased joint pain that has started getting worse in January this year.  She is worried that her issues are possibly an aftereffect of getting the medication remdesivir for COVID previously.  She reports that her left hip is painful, her knees are achy and sore.  She reports taking 6 ibuprofen tablets per day.  She states that she is not eating well and craving sugar, not sleeping well, having increased thirst.  Due to her presenting symptoms, her A1c was checked and it was normal at 5.5.  She has lost 3 pounds since her previous office visit.  She has been trying to go walking regularly and states that it hurts whether she walks or does not walk.    Current Medication:  Outpatient Encounter Medications as of 08/23/2021  Medication Sig   ibuprofen (ADVIL,MOTRIN) 200 MG tablet Take 600 mg by mouth every 6 (six) hours as needed for headache or moderate pain.    loperamide (IMODIUM) 2 MG capsule Take 4 mg by mouth 2 (two) times daily.   meloxicam (MOBIC) 15 MG tablet Take 1 tablet (15 mg total) by mouth daily.   [DISCONTINUED] cyanocobalamin (,VITAMIN B-12,) 1000 MCG/ML injection Inject 1000 MCG/ML once a week for 3 weeks then once a month for three months.  Please include syringes   [DISCONTINUED] ergocalciferol (DRISDOL) 1.25 MG (50000 UT) capsule Take 1 capsule (50,000 Units total) by mouth once a week.   ergocalciferol (DRISDOL) 1.25 MG (50000 UT) capsule Take 1 capsule (50,000 Units total) by mouth once a week.   [DISCONTINUED] progesterone (PROMETRIUM) 100 MG capsule Take 1 capsule (100 mg total) by mouth daily.    No facility-administered encounter medications on file as of 08/23/2021.      Medical History: Past Medical History:  Diagnosis Date   Anxiety 11/21/2013   Breath shortness 11/21/2013   Crohn disease ([place])    Dizziness 11/21/2013   Extensor tenosynovitis of wrist 12/24/2013   Ganglion cyst of wrist 12/24/2013   Hypotension, postural 11/22/2013   Psoriasis    Skin cancer    Thyroid disease      Vital Signs: BP 140/80   Pulse 71   Temp 98.3 F (36.8 C)   Resp 16   Ht 5' 8.5" (1.74 m)   Wt 206 lb 6.4 oz (93.6 kg)   SpO2 99%   BMI 30.93 kg/m    Review of Systems  Constitutional:  Positive for activity change and appetite change.  Respiratory: Negative.  Negative for cough, chest tightness, shortness of breath and wheezing.   Cardiovascular: Negative.  Negative for chest pain and palpitations.  Gastrointestinal:  Negative for abdominal pain, constipation, diarrhea, nausea and vomiting.  Endocrine: Positive for polydipsia and polyphagia.  Musculoskeletal:  Positive for arthralgias, back pain, joint swelling and myalgias.  Skin:  Negative for rash.    Physical Exam Vitals reviewed.  Constitutional:      General: She is not in acute distress.    Appearance: Normal appearance. She is obese. She is not ill-appearing.  HENT:     Head:  Normocephalic and atraumatic.  Eyes:     Pupils: Pupils are equal, round, and reactive to light.  Cardiovascular:     Rate and Rhythm: Normal rate and regular rhythm.  Pulmonary:     Effort: Pulmonary effort is normal. No respiratory distress.  Neurological:     Mental Status: She is alert and oriented to person, place, and time.  Psychiatric:        Mood and Affect: Mood normal.        Behavior: Behavior normal.       Assessment/Plan: 1. Chronic pain of multiple joints Meloxicam prescribed to decrease pain and inflammation, take 15 mg daily, labs ordered to further evaluate for possible autoimmune problems related to joint pain  and psoriasis - Rheumatoid Factor - Sed Rate (ESR) - ANA Direct w/Reflex if Positive - C-reactive protein - HLA-B27 antigen - meloxicam (MOBIC) 15 MG tablet; Take 1 tablet (15 mg total) by mouth daily.  Dispense: 30 tablet; Refill: 2  2. Stiffness of multiple joints Meloxicam ordered to decrease pain and inflammation of joints, additional labs ordered to rule out other autoimmune problems - Rheumatoid Factor - Sed Rate (ESR) - ANA Direct w/Reflex if Positive - C-reactive protein - HLA-B27 antigen - meloxicam (MOBIC) 15 MG tablet; Take 1 tablet (15 mg total) by mouth daily.  Dispense: 30 tablet; Refill: 2  3. Psoriasis Due to patient having psoriasis will check labs for additional autoimmune problems related to joint such as psoriatic arthritis - Rheumatoid Factor - Sed Rate (ESR) - ANA Direct w/Reflex if Positive - C-reactive protein - HLA-B27 antigen  4. Impaired fasting glucose A1c checked due to presenting symptoms of polydipsia and polyphagia and it was 5.5 which is normal - POCT HgB A1C  5. Vitamin D deficiency Vitamin D prescription supplement refill ordered  - ergocalciferol (DRISDOL) 1.25 MG (50000 UT) capsule; Take 1 capsule (50,000 Units total) by mouth once a week.  Dispense: 12 capsule; Refill: 1   General Counseling: Dennisha verbalizes understanding of the findings of todays visit and agrees with plan of treatment. I have discussed any further diagnostic evaluation that may be needed or ordered today. We also reviewed her medications today. she has been encouraged to call the office with any questions or concerns that should arise related to todays visit.    Counseling:    Orders Placed This Encounter  Procedures   Rheumatoid Factor   Sed Rate (ESR)   ANA Direct w/Reflex if Positive   C-reactive protein   HLA-B27 antigen   POCT HgB A1C    Meds ordered this encounter  Medications   ergocalciferol (DRISDOL) 1.25 MG (50000 UT) capsule    Sig: Take 1  capsule (50,000 Units total) by mouth once a week.    Dispense:  12 capsule    Refill:  1   meloxicam (MOBIC) 15 MG tablet    Sig: Take 1 tablet (15 mg total) by mouth daily.    Dispense:  30 tablet    Refill:  2    Return in about 3 weeks (around 09/13/2021) for F/U, Labs,  PCP.  [place] Controlled Substance Database was reviewed by me for overdose risk score (ORS)  Time spent:30 Minutes Time spent with patient included reviewing progress notes, labs, imaging studies, and discussing plan for follow up.   This patient was seen by Jonetta Osgood, FNP-C in collaboration with Dr. Clayborn Bigness as a part of collaborative care agreement.   R. Valetta Fuller, MSN, FNP-C Internal Medicine

## 2021-08-28 NOTE — Progress Notes (Signed)
Will discuss the results at her upcoming office visit

## 2021-08-31 LAB — HLA-B27 ANTIGEN: HLA B27: NEGATIVE

## 2021-08-31 LAB — RHEUMATOID FACTOR: Rheumatoid fact SerPl-aCnc: 10.7 IU/mL (ref <14.0)

## 2021-08-31 LAB — ANA W/REFLEX IF POSITIVE: Anti Nuclear Antibody (ANA): NEGATIVE

## 2021-08-31 LAB — SEDIMENTATION RATE: Sed Rate: 11 mm/hr (ref 0–40)

## 2021-08-31 LAB — C-REACTIVE PROTEIN: CRP: 5 mg/L (ref 0–10)

## 2021-09-08 ENCOUNTER — Telehealth: Payer: Self-pay

## 2021-09-08 MED ORDER — PREDNISONE 10 MG (21) PO TBPK: ORAL_TABLET | Freq: Every day | ORAL | 0 refills | Status: DC

## 2021-09-08 MED ORDER — CYCLOBENZAPRINE HCL 10 MG PO TABS: 10.0000 mg | ORAL_TABLET | Freq: Every evening | ORAL | 1 refills | Status: DC

## 2021-09-08 NOTE — Telephone Encounter (Signed)
Pt called and c/o having bi-lateral hip pain.  Pt advised that she has a hard time sitting down and whenshe does she can't get up good.  Pt has been alternating cold and hot compresses.  Pt has appt on 09/17/21 and will discuss if any better then.  Pt informed that if no better or gets worse that she can go to urgent care as pt is living little over 3 hrs away.  Pert Alyssa we sent prednisone dose pack for 6 days and flexeril 10 mg at bedtime [facility]

## 2021-09-17 ENCOUNTER — Ambulatory Visit (INDEPENDENT_AMBULATORY_CARE_PROVIDER_SITE_OTHER): Payer: 59 | Admitting: Nurse Practitioner

## 2021-09-17 ENCOUNTER — Encounter: Payer: Self-pay | Admitting: Nurse Practitioner

## 2021-09-17 VITALS — BP 140/68 | HR 96 | Temp 97.9°F | Resp 16 | Ht 68.5 in | Wt 208.0 lb

## 2021-09-17 DIAGNOSIS — G8929 Other chronic pain: Secondary | ICD-10-CM | POA: Diagnosis not present

## 2021-09-17 DIAGNOSIS — M2550 Pain in unspecified joint: Secondary | ICD-10-CM

## 2021-09-17 DIAGNOSIS — Z6831 Body mass index (BMI) 31.0-31.9, adult: Secondary | ICD-10-CM

## 2021-09-17 DIAGNOSIS — E6609 Other obesity due to excess calories: Secondary | ICD-10-CM | POA: Diagnosis not present

## 2021-09-17 DIAGNOSIS — M2560 Stiffness of unspecified joint, not elsewhere classified: Secondary | ICD-10-CM | POA: Diagnosis not present

## 2021-09-17 MED ORDER — DICLOFENAC SODIUM 75 MG PO TBEC: 75.0000 mg | DELAYED_RELEASE_TABLET | Freq: Two times a day (BID) | ORAL | 2 refills | Status: DC

## 2021-09-17 MED ORDER — PHENDIMETRAZINE TARTRATE ER 105 MG PO CP24: 1.0000 | ORAL_CAPSULE | Freq: Every day | ORAL | 1 refills | Status: DC

## 2021-09-17 NOTE — Progress Notes (Signed)
[facility] [place], [place] [address]  Internal MEDICINE  Office Visit Note  Patient Name: Ruth Morales  [id number]  [id number]  Date of Service: 09/17/2021  Chief Complaint  Patient presents with   Follow-up   Anxiety   Hip Pain    Still having hip pain   Medication Reaction    Meloxicam is causing upset stomach   Quality Metric Gaps    Shingles Vaccine    HPI [name] presents for a follow-up visit for chronic hip pain, evaluate new medication and anxiety.  Patient was prescribed meloxicam to help with inflammatory hip pain but she reports that the meloxicam has been making her stomach very upset and causing issues and she would like to try something that will not make her stomach upset.  She has an area of swelling/fat distribution at the base of the back of her neck which is typical with oral prednisone which she recently finished a prednisone taper.  Ensured patient that should improve with time.  She reports that the prednisone taper did help the hip pain and she did not have any hip pain while she was taking it but after she finished the taper it had returned. Autoimmune labs were done and her CRP and sed rate were normal, rheumatoid factor was negative, ANA was negative and HLA-B27 was negative. Her hemoglobin A1c was also normal at 5.5 at her previous office visit.    Current Medication: Outpatient Encounter Medications as of 09/17/2021  Medication Sig   cyclobenzaprine (FLEXERIL) 10 MG tablet Take 1 tablet (10 mg total) by mouth at bedtime.   diclofenac (VOLTAREN) 75 MG EC tablet Take 1 tablet (75 mg total) by mouth 2 (two) times daily.   ergocalciferol (DRISDOL) 1.25 MG (50000 UT) capsule Take 1 capsule (50,000 Units total) by mouth once a week.   ibuprofen (ADVIL,MOTRIN) 200 MG tablet Take 600 mg by mouth every 6 (six) hours as needed for headache or moderate pain.    loperamide (IMODIUM) 2 MG capsule Take 4 mg by mouth 2 (two) times daily.    meloxicam (MOBIC) 15 MG tablet Take 1 tablet (15 mg total) by mouth daily.   Phendimetrazine Tartrate 105 MG CP24 Take 1 capsule (105 mg total) by mouth daily before breakfast.   predniSONE (STERAPRED UNI-PAK 21 TAB) 10 MG (21) TBPK tablet Take by mouth daily. Take as directed for 6 days   [DISCONTINUED] progesterone (PROMETRIUM) 100 MG capsule Take 1 capsule (100 mg total) by mouth daily.   No facility-administered encounter medications on file as of 09/17/2021.    Surgical History: Past Surgical History:  Procedure Laterality Date   BREAST BIOPSY Left 2003   needle bx-neg   BREAST SURGERY Bilateral 2004   breast reduction   CATARACT EXTRACTION W/PHACO Left 02/06/2018   Procedure: CATARACT EXTRACTION PHACO AND INTRAOCULAR LENS PLACEMENT ([place]);  Surgeon: Birder Robson, MD;  Location: [facility] ORS;  Service: Ophthalmology;  Laterality: Left;  [place] 00:31.5 CDE 3.49 Fluid pack Lot # [id number]   COLON SURGERY  2004   colon removed   CTR     EXTRACORPOREAL SHOCK WAVE LITHOTRIPSY Right 02/26/2015   Procedure: EXTRACORPOREAL SHOCK WAVE LITHOTRIPSY (ESWL);  Surgeon: Collier Flowers, MD;  Location: [facility] ORS;  Service: Urology;  Laterality: Right;   REDUCTION MAMMAPLASTY Bilateral 2005    Medical History: Past Medical History:  Diagnosis Date   Anxiety 11/21/2013   Breath shortness 11/21/2013   Crohn disease ([place])    Dizziness 11/21/2013  Extensor tenosynovitis of wrist 12/24/2013   Ganglion cyst of wrist 12/24/2013   Hypotension, postural 11/22/2013   Psoriasis    Skin cancer    Thyroid disease     Family History: Family History  Problem Relation Age of Onset   Diabetes Father    Diabetes Mother    Heart disease Maternal Grandmother    Breast cancer Neg Hx     Social History   Socioeconomic History   Marital status: Married    Spouse name: Not on file   Number of children: Not on file   Years of education: Not on file   Highest education level: Not on file  Occupational History    Not on file  Tobacco Use   Smoking status: Former   Smokeless tobacco: Never  Vaping Use   Vaping Use: Never used  Substance and Sexual Activity   Alcohol use: Yes    Alcohol/week: 1.0 standard drink of alcohol    Types: 1 Standard drinks or equivalent per week    Comment: occasional drink, once a month   Drug use: No   Sexual activity: Yes    Partners: Male  Other Topics Concern   Not on file  Social History Narrative   Not on file   Social Determinants of Health   Financial Resource Strain: Not on file  Food Insecurity: Not on file  Transportation Needs: Not on file  Physical Activity: Not on file  Stress: Not on file  Social Connections: Not on file  Intimate Partner Violence: Not on file      Review of Systems  Constitutional:  Negative for chills, fatigue and unexpected weight change.  HENT:  Negative for congestion, rhinorrhea, sneezing and sore throat.   Eyes:  Negative for redness.  Respiratory:  Negative for cough, chest tightness, shortness of breath and wheezing.   Cardiovascular: Negative.  Negative for chest pain and palpitations.  Gastrointestinal:  Negative for abdominal pain, constipation, diarrhea, nausea and vomiting.  Genitourinary:  Negative for dysuria and frequency.  Musculoskeletal:  Positive for arthralgias, back pain and neck pain. Negative for joint swelling.  Skin:  Negative for rash.  Neurological: Negative.  Negative for tremors and numbness.  Hematological:  Negative for adenopathy. Does not bruise/bleed easily.  Psychiatric/Behavioral:  Negative for behavioral problems (Depression), sleep disturbance and suicidal ideas. The patient is not nervous/anxious.     Vital Signs: BP 140/68   Pulse 96   Temp 97.9 F (36.6 C)   Resp 16   Ht 5' 8.5" (1.74 m)   Wt 208 lb (94.3 kg)   SpO2 97%   BMI 31.17 kg/m    Physical Exam Vitals reviewed.  Constitutional:      General: She is not in acute distress.    Appearance: Normal  appearance. She is obese. She is not ill-appearing.  HENT:     Head: Normocephalic and atraumatic.  Eyes:     Pupils: Pupils are equal, round, and reactive to light.  Cardiovascular:     Rate and Rhythm: Normal rate and regular rhythm.  Pulmonary:     Effort: Pulmonary effort is normal. No respiratory distress.  Neurological:     Mental Status: She is alert and oriented to person, place, and time.  Psychiatric:        Mood and Affect: Mood normal.        Behavior: Behavior normal.        Assessment/Plan: 1. Chronic pain of multiple joints Meloxicam discontinued, will try  diclofenac enteric-coated tablet due to patient's GI issues.  Prescription sent to pharmacy - diclofenac (VOLTAREN) 75 MG EC tablet; Take 1 tablet (75 mg total) by mouth 2 (two) times daily.  Dispense: 60 tablet; Refill: 2  2. Stiffness of multiple joints See problem #1 - diclofenac (VOLTAREN) 75 MG EC tablet; Take 1 tablet (75 mg total) by mouth 2 (two) times daily.  Dispense: 60 tablet; Refill: 2  3. Class 1 obesity due to excess calories with serious comorbidity and body mass index (BMI) of 31.0 to 31.9 in adult We will try phendimetrazine extended release capsule to aid in weight loss, prescription sent to pharmacy with 1 refill due to patient living in [facility] currently which is several hours away.  We will follow-up in 2 months to reevaluate. - Phendimetrazine Tartrate 105 MG CP24; Take 1 capsule (105 mg total) by mouth daily before breakfast.  Dispense: 30 capsule; Refill: 1   General Counseling: Adaora verbalizes understanding of the findings of todays visit and agrees with plan of treatment. I have discussed any further diagnostic evaluation that may be needed or ordered today. We also reviewed her medications today. she has been encouraged to call the office with any questions or concerns that should arise related to todays visit.    No orders of the defined types were placed in this  encounter.   Meds ordered this encounter  Medications   diclofenac (VOLTAREN) 75 MG EC tablet    Sig: Take 1 tablet (75 mg total) by mouth 2 (two) times daily.    Dispense:  60 tablet    Refill:  2    Fill prescription today, discontinue meloxicam   Phendimetrazine Tartrate 105 MG CP24    Sig: Take 1 capsule (105 mg total) by mouth daily before breakfast.    Dispense:  30 capsule    Refill:  1    Do not run through insurance, please run through goodrx coupon that patient will provide.    Return in about 2 months (around 11/17/2021) for F/U, Weight loss,  PCP.   Total time spent:30 Minutes Time spent includes review of chart, medications, test results, and follow up plan with the patient.   [place] Controlled Substance Database was reviewed by me.  This patient was seen by Jonetta Osgood, FNP-C in collaboration with Dr. Clayborn Bigness as a part of collaborative care agreement.    R. Valetta Fuller, MSN, FNP-C Internal medicine

## 2021-09-22 ENCOUNTER — Telehealth: Payer: Self-pay

## 2021-09-22 NOTE — Telephone Encounter (Signed)
Spoke to pt, informed her Phendimetrazine PA was denied but [facility] it would be about $26.08, pt stated she was aware and [facility].

## 2021-10-17 ENCOUNTER — Encounter: Payer: Self-pay | Admitting: Nurse Practitioner

## 2021-10-25 ENCOUNTER — Encounter: Payer: Self-pay | Admitting: Ophthalmology

## 2021-11-01 NOTE — Discharge Instructions (Signed)

## 2021-11-03 ENCOUNTER — Ambulatory Visit
Admission: RE | Admit: 2021-11-03 | Discharge: 2021-11-03 | Disposition: A | Discharge status: Home / Self Care | Payer: 59 | Attending: Ophthalmology | Admitting: Ophthalmology

## 2021-11-03 ENCOUNTER — Ambulatory Visit: Payer: 59 | Admitting: Anesthesiology

## 2021-11-03 ENCOUNTER — Encounter: Payer: Self-pay | Admitting: Ophthalmology

## 2021-11-03 ENCOUNTER — Encounter
Admission: RE | Disposition: A | Discharge status: Home / Self Care | Payer: Self-pay | Source: Home / Self Care | Attending: Ophthalmology

## 2021-11-03 ENCOUNTER — Other Ambulatory Visit: Payer: Self-pay

## 2021-11-03 DIAGNOSIS — I10 Essential (primary) hypertension: Secondary | ICD-10-CM | POA: Diagnosis not present

## 2021-11-03 DIAGNOSIS — H2511 Age-related nuclear cataract, right eye: Secondary | ICD-10-CM | POA: Diagnosis present

## 2021-11-03 DIAGNOSIS — Z87891 Personal history of nicotine dependence: Secondary | ICD-10-CM | POA: Diagnosis not present

## 2021-11-03 HISTORY — DX: Unspecified osteoarthritis, unspecified site: M19.90

## 2021-11-03 HISTORY — PX: CATARACT EXTRACTION W/PHACO: SHX586

## 2021-11-03 SURGERY — PHACOEMULSIFICATION, CATARACT, WITH IOL INSERTION
Anesthesia: Monitor Anesthesia Care | Site: Eye | Laterality: Right

## 2021-11-03 MED ORDER — ONDANSETRON HCL 4 MG/2ML IJ SOLN: 4.0000 mg | Freq: Once | INTRAMUSCULAR | Status: DC | PRN

## 2021-11-03 MED ORDER — BRIMONIDINE TARTRATE-TIMOLOL 0.2-0.5 % OP SOLN
OPHTHALMIC | Status: DC | PRN
  Administered 2021-11-03: 1 [drp] via OPHTHALMIC

## 2021-11-03 MED ORDER — SIGHTPATH DOSE#1 NA CHONDROIT SULF-NA HYALURON 40-17 MG/ML IO SOLN
INTRAOCULAR | Status: DC | PRN
  Administered 2021-11-03: 1 mL via INTRAOCULAR

## 2021-11-03 MED ORDER — SIGHTPATH DOSE#1 BSS IO SOLN
INTRAOCULAR | Status: DC | PRN
  Administered 2021-11-03: 51 mL via OPHTHALMIC

## 2021-11-03 MED ORDER — MOXIFLOXACIN HCL 0.5 % OP SOLN
OPHTHALMIC | Status: DC | PRN
  Administered 2021-11-03: 0.2 mL via OPHTHALMIC

## 2021-11-03 MED ORDER — ARMC OPHTHALMIC DILATING DROPS
1.0000 | OPHTHALMIC | Status: DC | PRN
  Administered 2021-11-03 (×3): 1 via OPHTHALMIC

## 2021-11-03 MED ORDER — ACETAMINOPHEN 325 MG PO TABS: 325.0000 mg | ORAL_TABLET | ORAL | Status: DC | PRN

## 2021-11-03 MED ORDER — TETRACAINE HCL 0.5 % OP SOLN
1.0000 [drp] | OPHTHALMIC | Status: DC | PRN
  Administered 2021-11-03 (×3): 1 [drp] via OPHTHALMIC

## 2021-11-03 MED ORDER — FENTANYL CITRATE (PF) 100 MCG/2ML IJ SOLN
INTRAMUSCULAR | Status: DC | PRN
Start: 2021-11-03 — End: 2021-11-03
  Administered 2021-11-03: 50 ug via INTRAVENOUS

## 2021-11-03 MED ORDER — SIGHTPATH DOSE#1 BSS IO SOLN
INTRAOCULAR | Status: DC | PRN
  Administered 2021-11-03: 15 mL

## 2021-11-03 MED ORDER — ACETAMINOPHEN 160 MG/5ML PO SOLN: 325.0000 mg | ORAL | Status: DC | PRN

## 2021-11-03 MED ORDER — MIDAZOLAM HCL 2 MG/2ML IJ SOLN
INTRAMUSCULAR | Status: DC | PRN
  Administered 2021-11-03: 1 mg via INTRAVENOUS

## 2021-11-03 MED ORDER — SIGHTPATH DOSE#1 BSS IO SOLN
INTRAOCULAR | Status: DC | PRN
  Administered 2021-11-03: 1 mL via INTRAMUSCULAR

## 2021-11-03 SURGICAL SUPPLY — 10 items
CATARACT SUITE SIGHTPATH (MISCELLANEOUS) ×2 IMPLANT
FEE CATARACT SUITE SIGHTPATH (MISCELLANEOUS) ×1 IMPLANT
GLOVE SURG ENC TEXT LTX SZ8 (GLOVE) ×2 IMPLANT
GLOVE SURG TRIUMPH 8.0 PF LTX (GLOVE) ×2 IMPLANT
LENS IOL TECNIS EYHANCE 21.5 (Intraocular Lens) ×1 IMPLANT
NDL FILTER BLUNT 18X1 1/2 (NEEDLE) ×1 IMPLANT
NEEDLE FILTER BLUNT 18X 1/2SAF (NEEDLE) ×1
NEEDLE FILTER BLUNT 18X1 1/2 (NEEDLE) ×1 IMPLANT
SYR 3ML LL SCALE MARK (SYRINGE) ×2 IMPLANT
WATER STERILE IRR 250ML POUR (IV SOLUTION) ×2 IMPLANT

## 2021-11-03 NOTE — H&P (Signed)
[place]   Primary Care Physician:  Jonetta Osgood, NP Ophthalmologist: Dr. George Ina  Pre-Procedure History & Physical: HPI:  Ruth Morales is a 61 y.o. female here for cataract surgery.   Past Medical History:  Diagnosis Date   Anxiety 11/21/2013   Arthritis    Breath shortness 11/21/2013   Crohn disease ([place])    Dizziness 11/21/2013   Extensor tenosynovitis of wrist 12/24/2013   Ganglion cyst of wrist 12/24/2013   Hypotension, postural 11/22/2013   Psoriasis    Skin cancer    Thyroid disease     Past Surgical History:  Procedure Laterality Date   BREAST BIOPSY Left 2003   needle bx-neg   BREAST SURGERY Bilateral 2004   breast reduction   CATARACT EXTRACTION W/PHACO Left 02/06/2018   Procedure: CATARACT EXTRACTION PHACO AND INTRAOCULAR LENS PLACEMENT ([place]);  Surgeon: Birder Robson, MD;  Location: [facility] ORS;  Service: Ophthalmology;  Laterality: Left;  [place] 00:31.5 CDE 3.49 Fluid pack Lot # [id number] H   COLON SURGERY  2004   colon removed   CTR     EXTRACORPOREAL SHOCK WAVE LITHOTRIPSY Right 02/26/2015   Procedure: EXTRACORPOREAL SHOCK WAVE LITHOTRIPSY (ESWL);  Surgeon: Collier Flowers, MD;  Location: [facility] ORS;  Service: Urology;  Laterality: Right;   REDUCTION MAMMAPLASTY Bilateral 2005    Prior to Admission medications   Medication Sig Start Date End Date Taking? Authorizing Provider  diclofenac (VOLTAREN) 75 MG EC tablet Take 1 tablet (75 mg total) by mouth 2 (two) times daily. 09/17/21  Yes Abernathy, Yetta Flock, NP  ergocalciferol (DRISDOL) 1.25 MG (50000 UT) capsule Take 1 capsule (50,000 Units total) by mouth once a week. 08/23/21  Yes Abernathy, Yetta Flock, NP  ibuprofen (ADVIL,MOTRIN) 200 MG tablet Take 600 mg by mouth every 6 (six) hours as needed for headache or moderate pain.    Yes [provider]  loperamide (IMODIUM) 2 MG capsule Take 4 mg by mouth 2 (two) times daily.   Yes [provider]  Phendimetrazine Tartrate 105 MG CP24 Take 1  capsule (105 mg total) by mouth daily before breakfast. 09/17/21  Yes Abernathy, Alyssa, NP  cyclobenzaprine (FLEXERIL) 10 MG tablet Take 1 tablet (10 mg total) by mouth at bedtime. Patient not taking: Reported on 11/03/2021 09/08/21   Jonetta Osgood, NP  meloxicam (MOBIC) 15 MG tablet Take 1 tablet (15 mg total) by mouth daily. Patient not taking: Reported on 10/25/2021 08/23/21   Jonetta Osgood, NP  predniSONE (STERAPRED UNI-PAK 21 TAB) 10 MG (21) TBPK tablet Take by mouth daily. Take as directed for 6 days Patient not taking: Reported on 10/25/2021 09/08/21   Jonetta Osgood, NP  progesterone (PROMETRIUM) 100 MG capsule Take 1 capsule (100 mg total) by mouth daily. 04/21/19 03/01/20  Ronnell Freshwater, NP    Allergies as of 09/20/2021 - Review Complete 09/17/2021  Allergen Reaction Noted   Morphine Other (See Comments) 02/05/2015   Penicillins Anaphylaxis and Other (See Comments) 01/15/2015   Macrobid [nitrofurantoin macrocrystal] Other (See Comments) 04/14/2017   Benadryl [diphenhydramine hcl] Anxiety and Other (See Comments) 02/25/2015    Family History  Problem Relation Age of Onset   Diabetes Father    Diabetes Mother    Heart disease Maternal Grandmother    Breast cancer Neg Hx     Social History   Socioeconomic History   Marital status: Married    Spouse name: Not on file   Number of children: Not on file   Years of education: Not on file  Highest education level: Not on file  Occupational History   Not on file  Tobacco Use   Smoking status: Former   Smokeless tobacco: Never  Vaping Use   Vaping Use: Never used  Substance and Sexual Activity   Alcohol use: Yes    Alcohol/week: 1.0 standard drink of alcohol    Types: 1 Standard drinks or equivalent per week    Comment: occasional drink, once a month   Drug use: No   Sexual activity: Yes    Partners: Male  Other Topics Concern   Not on file  Social History Narrative   Not on file   Social Determinants of  Health   Financial Resource Strain: Not on file  Food Insecurity: Not on file  Transportation Needs: Not on file  Physical Activity: Not on file  Stress: Not on file  Social Connections: Not on file  Intimate Partner Violence: Not on file    Review of Systems: See HPI, otherwise negative ROS  Physical Exam: BP (!) 166/69   Pulse 70   Temp 97.6 F (36.4 C) (Temporal)   Resp 11   Ht 5' 8.5" (1.74 m)   Wt 92.9 kg   SpO2 95%   BMI 30.69 kg/m  General:   Alert, cooperative in NAD Head:  Normocephalic and atraumatic. Respiratory:  Normal work of breathing. Cardiovascular:  RRR  Impression/Plan: Ruth Morales is here for cataract surgery.  Risks, benefits, limitations, and alternatives regarding cataract surgery have been reviewed with the patient.  Questions have been answered.  All parties agreeable.   Birder Robson, MD  11/03/2021, 8:30 AM

## 2021-11-03 NOTE — Op Note (Signed)
PREOPERATIVE DIAGNOSIS:  Nuclear sclerotic cataract of the right eye.   POSTOPERATIVE DIAGNOSIS:  H25.11 Cataract   OPERATIVE PROCEDURE:ORPROCALL@   SURGEON:  Birder Robson, MD.   ANESTHESIA:  Anesthesiologist: Veda Canning, MD CRNA: Dionne Bucy, CRNA  1.      Managed anesthesia care. 2.      0.27m of Shugarcaine was instilled in the eye following the paracentesis.   COMPLICATIONS:  None.   TECHNIQUE:   Stop and chop   DESCRIPTION OF PROCEDURE:  The patient was examined and consented in the preoperative holding area where the aforementioned topical anesthesia was applied to the right eye and then brought back to the Operating Room where the right eye was prepped and draped in the usual sterile ophthalmic fashion and a lid speculum was placed. A paracentesis was created with the side port blade and the anterior chamber was filled with viscoelastic. A near clear corneal incision was performed with the steel keratome. A continuous curvilinear capsulorrhexis was performed with a cystotome followed by the capsulorrhexis forceps. Hydrodissection and hydrodelineation were carried out with BSS on a blunt cannula. The lens was removed in a stop and chop  technique and the remaining cortical material was removed with the irrigation-aspiration handpiece. The capsular bag was inflated with viscoelastic and the Technis [id number]  lens was placed in the capsular bag without complication. The remaining viscoelastic was removed from the eye with the irrigation-aspiration handpiece. The wounds were hydrated. The anterior chamber was flushed with BSS and the eye was inflated to physiologic pressure. 0.158mof Vigamox was placed in the anterior chamber. The wounds were found to be water tight. The eye was dressed with Combigan. The patient was given protective glasses to wear throughout the day and a shield with which to sleep tonight. The patient was also given drops with which to begin a drop regimen today and  will follow-up with me in one day. Implant Name Type Inv. Item Serial No. Manufacturer Lot No. LRB No. Used Action  LENS IOL TECNIS EYHANCE 21.5 - [id number] Lens LENS IOL TECNIS EYHANCE 21.5 [id number]  Right 1 Implanted   Procedure(s): CATARACT EXTRACTION PHACO AND INTRAOCULAR LENS PLACEMENT ([facility]) RIGHT 5.83 00:35.4 (Right)  Electronically signed: WiBirder Robson/26/2023 9:02 AM

## 2021-11-03 NOTE — Anesthesia Postprocedure Evaluation (Signed)
Anesthesia Post Note  Patient: Ruth Morales  Procedure(s) Performed: CATARACT EXTRACTION PHACO AND INTRAOCULAR LENS PLACEMENT ([facility]) RIGHT 5.83 00:35.4 (Right: Eye)     Patient location during evaluation: PACU Anesthesia Type: MAC Level of consciousness: awake Pain management: pain level controlled Vital Signs Assessment: post-procedure vital signs reviewed and stable Respiratory status: respiratory function stable Cardiovascular status: stable Postop Assessment: no apparent nausea or vomiting Anesthetic complications: no   No notable events documented.  Veda Canning

## 2021-11-03 NOTE — Transfer of Care (Signed)
Immediate Anesthesia Transfer of Care Note  Patient: Ruth Morales  Procedure(s) Performed: CATARACT EXTRACTION PHACO AND INTRAOCULAR LENS PLACEMENT (IOC) RIGHT 5.83 00:35.4 (Right: Eye)  Patient Location: PACU  Anesthesia Type: MAC  Level of Consciousness: awake, alert  and patient cooperative  Airway and Oxygen Therapy: Patient Spontanous Breathing and Patient connected to supplemental oxygen  Post-op Assessment: Post-op Vital signs reviewed, Patient's Cardiovascular Status Stable, Respiratory Function Stable, Patent Airway and No signs of Nausea or vomiting  Post-op Vital Signs: Reviewed and stable  Complications: No notable events documented.

## 2021-11-03 NOTE — Anesthesia Preprocedure Evaluation (Signed)
Anesthesia Evaluation  Patient identified by MRN, date of birth, ID band Patient awake    Reviewed: Allergy & Precautions, NPO status   Airway Mallampati: II  TM Distance: >3 FB     Dental   Pulmonary former smoker,    Pulmonary exam normal        Cardiovascular hypertension,  Rhythm:Regular Rate:Normal     Neuro/Psych  Headaches, Anxiety    GI/Hepatic Crohn's disease   Endo/Other    Renal/GU      Musculoskeletal  (+) Arthritis ,   Abdominal   Peds  Hematology   Anesthesia Other Findings   Reproductive/Obstetrics                             Anesthesia Physical Anesthesia Plan  ASA: 2  Anesthesia Plan: MAC   Post-op Pain Management: Minimal or no pain anticipated   Induction: Intravenous  PONV Risk Score and Plan: TIVA, Midazolam and Treatment may vary due to age or medical condition  Airway Management Planned: Natural Airway and Nasal Cannula  Additional Equipment:   Intra-op Plan:   Post-operative Plan:   Informed Consent: I have reviewed the patients History and Physical, chart, labs and discussed the procedure including the risks, benefits and alternatives for the proposed anesthesia with the patient or authorized representative who has indicated his/her understanding and acceptance.       Plan Discussed with: CRNA  Anesthesia Plan Comments:         Anesthesia Quick Evaluation

## 2021-11-04 ENCOUNTER — Encounter: Payer: Self-pay | Admitting: Ophthalmology

## 2021-11-11 ENCOUNTER — Ambulatory Visit: Payer: 59 | Admitting: Nurse Practitioner

## 2021-11-19 ENCOUNTER — Ambulatory Visit: Payer: 59 | Admitting: Nurse Practitioner

## 2022-04-14 ENCOUNTER — Encounter: Payer: 59 | Admitting: Nurse Practitioner

## 2022-05-10 ENCOUNTER — Ambulatory Visit (INDEPENDENT_AMBULATORY_CARE_PROVIDER_SITE_OTHER): Payer: 59 | Admitting: Nurse Practitioner

## 2022-05-10 ENCOUNTER — Encounter: Payer: Self-pay | Admitting: Nurse Practitioner

## 2022-05-10 VITALS — BP 132/68 | HR 70 | Temp 96.9°F | Resp 16 | Ht 68.5 in | Wt 204.6 lb

## 2022-05-10 DIAGNOSIS — E6609 Other obesity due to excess calories: Secondary | ICD-10-CM | POA: Diagnosis not present

## 2022-05-10 DIAGNOSIS — R7301 Impaired fasting glucose: Secondary | ICD-10-CM

## 2022-05-10 DIAGNOSIS — Z6831 Body mass index (BMI) 31.0-31.9, adult: Secondary | ICD-10-CM | POA: Diagnosis not present

## 2022-05-10 DIAGNOSIS — E559 Vitamin D deficiency, unspecified: Secondary | ICD-10-CM

## 2022-05-10 DIAGNOSIS — Z0001 Encounter for general adult medical examination with abnormal findings: Secondary | ICD-10-CM

## 2022-05-10 DIAGNOSIS — Z780 Asymptomatic menopausal state: Secondary | ICD-10-CM | POA: Diagnosis not present

## 2022-05-10 DIAGNOSIS — E538 Deficiency of other specified B group vitamins: Secondary | ICD-10-CM

## 2022-05-10 DIAGNOSIS — E782 Mixed hyperlipidemia: Secondary | ICD-10-CM | POA: Diagnosis not present

## 2022-05-10 DIAGNOSIS — R300 Dysuria: Secondary | ICD-10-CM

## 2022-05-10 NOTE — Progress Notes (Signed)
[facility] [place], [place] [address]  Internal MEDICINE  Office Visit Note  Patient Name: Ruth Morales  [id number]  [id number]  Date of Service: 05/10/2022  Chief Complaint  Patient presents with   Annual Exam    HPI Ruth Morales presents for an annual well visit and physical exam.  Well-appearing 62 y.o. female with high cholesterol, psoriasis, and hypertension.  Routine CRC screening: goes to [facility], does not have colon.  Routine mammogram: done in June 2023 BIRADS 2 Pap smear: due in September this year  Labs: due for routine labs  New or worsening pain: none Other concerns: Family history of diabetes and dialysis.       05/10/2022   10:07 AM  Depression screen PHQ 2/9  Decreased Interest 0  Down, Depressed, Hopeless 0  PHQ - 2 Score 0    Functional Status Survey: Is the patient deaf or have difficulty hearing?: No Does the patient have difficulty seeing, even when wearing glasses/contacts?: No Does the patient have difficulty concentrating, remembering, or making decisions?: No Does the patient have difficulty walking or climbing stairs?: No Does the patient have difficulty dressing or bathing?: No Does the patient have difficulty doing errands alone such as visiting a doctor's office or shopping?: No      08/14/2020    8:49 AM 11/18/2020    8:33 AM 05/06/2021    9:11 AM 09/17/2021   10:42 AM 05/10/2022   10:07 AM  [place] in the past year? 0 0 0 0 0  Was there an injury with Fall?     0  Fall Risk Category Calculator     0  Patient at Risk for Falls Due to  No Fall Risks   No Fall Risks  Fall risk Follow up  Falls evaluation completed   Falls evaluation completed     Current Medication: Outpatient Encounter Medications as of 05/10/2022  Medication Sig   cyclobenzaprine (FLEXERIL) 10 MG tablet Take 1 tablet (10 mg total) by mouth at bedtime.   diclofenac (VOLTAREN) 75 MG EC tablet Take 1 tablet (75 mg total) by mouth 2 (two) times daily.    ergocalciferol (DRISDOL) 1.25 MG (50000 UT) capsule Take 1 capsule (50,000 Units total) by mouth once a week.   ibuprofen (ADVIL,MOTRIN) 200 MG tablet Take 600 mg by mouth every 6 (six) hours as needed for headache or moderate pain.    loperamide (IMODIUM) 2 MG capsule Take 4 mg by mouth 2 (two) times daily.   [DISCONTINUED] meloxicam (MOBIC) 15 MG tablet Take 1 tablet (15 mg total) by mouth daily.   [DISCONTINUED] Phendimetrazine Tartrate 105 MG CP24 Take 1 capsule (105 mg total) by mouth daily before breakfast.   [DISCONTINUED] predniSONE (STERAPRED UNI-PAK 21 TAB) 10 MG (21) TBPK tablet Take by mouth daily. Take as directed for 6 days   celecoxib (CELEBREX) 100 MG capsule Take 100 mg by mouth 2 (two) times daily.   [DISCONTINUED] progesterone (PROMETRIUM) 100 MG capsule Take 1 capsule (100 mg total) by mouth daily.   No facility-administered encounter medications on file as of 05/10/2022.    Surgical History: Past Surgical History:  Procedure Laterality Date   BREAST BIOPSY Left 2003   needle bx-neg   BREAST SURGERY Bilateral 2004   breast reduction   CATARACT EXTRACTION W/PHACO Left 02/06/2018   Procedure: CATARACT EXTRACTION PHACO AND INTRAOCULAR LENS PLACEMENT ([place]);  Surgeon: Birder Robson, MD;  Location: [facility] ORS;  Service: Ophthalmology;  Laterality: Left;  [place] 00:31.5 CDE 3.49 Fluid pack Lot # [id number]   CATARACT EXTRACTION W/PHACO Right 11/03/2021   Procedure: CATARACT EXTRACTION PHACO AND INTRAOCULAR LENS PLACEMENT (IOC) RIGHT 5.83 00:35.4;  Surgeon: Birder Robson, MD;  Location: [facility];  Service: Ophthalmology;  Laterality: Right;   COLON SURGERY  2004   colon removed   CTR     EXTRACORPOREAL SHOCK WAVE LITHOTRIPSY Right 02/26/2015   Procedure: EXTRACORPOREAL SHOCK WAVE LITHOTRIPSY (ESWL);  Surgeon: Collier Flowers, MD;  Location: [facility];  Service: Urology;  Laterality: Right;   REDUCTION MAMMAPLASTY Bilateral 2005    Medical History: Past Medical  History:  Diagnosis Date   Anxiety 11/21/2013   Arthritis    Breath shortness 11/21/2013   Crohn disease ([place])    Dizziness 11/21/2013   Extensor tenosynovitis of wrist 12/24/2013   Ganglion cyst of wrist 12/24/2013   Hypotension, postural 11/22/2013   Psoriasis    Skin cancer    Thyroid disease     Family History: Family History  Problem Relation Age of Onset   Diabetes Father    Diabetes Mother    Heart disease Maternal Grandmother    Breast cancer Neg Hx     Social History   Socioeconomic History   Marital status: Married    Spouse name: Not on file   Number of children: Not on file   Years of education: Not on file   Highest education level: Not on file  Occupational History   Not on file  Tobacco Use   Smoking status: Former   Smokeless tobacco: Never  Vaping Use   Vaping Use: Never used  Substance and Sexual Activity   Alcohol use: Yes    Alcohol/week: 1.0 standard drink of alcohol    Types: 1 Standard drinks or equivalent per week    Comment: occasional drink, once a month   Drug use: No   Sexual activity: Yes    Partners: Male  Other Topics Concern   Not on file  Social History Narrative   Not on file   Social Determinants of Health   Financial Resource Strain: Not on file  Food Insecurity: Not on file  Transportation Needs: Not on file  Physical Activity: Not on file  Stress: Not on file  Social Connections: Not on file  Intimate Partner Violence: Not on file      Review of Systems  Constitutional:  Negative for activity change, appetite change, chills, fatigue, fever and unexpected weight change.  HENT: Negative.  Negative for congestion, ear pain, rhinorrhea, sore throat and trouble swallowing.   Eyes: Negative.   Respiratory: Negative.  Negative for cough, chest tightness, shortness of breath and wheezing.   Cardiovascular: Negative.  Negative for chest pain.  Gastrointestinal: Negative.  Negative for abdominal pain, blood in stool,  constipation, diarrhea, nausea and vomiting.  Endocrine: Negative.   Genitourinary: Negative.  Negative for difficulty urinating, dysuria, frequency, hematuria and urgency.  Musculoskeletal: Negative.  Negative for arthralgias, back pain, joint swelling, myalgias and neck pain.       Restless legs  Skin: Negative.  Negative for rash and wound.  Allergic/Immunologic: Negative.  Negative for immunocompromised state.  Neurological: Negative.  Negative for dizziness, seizures, numbness and headaches.  Hematological: Negative.   Psychiatric/Behavioral: Negative.  Negative for behavioral problems, self-injury and suicidal ideas. The patient is not nervous/anxious.     Vital Signs: BP 132/68 Comment: 142/80  Pulse 70   Temp (!) 96.9 F (36.1 C)   Resp 16  Ht 5' 8.5" (1.74 m)   Wt 204 lb 9.6 oz (92.8 kg)   SpO2 94%   BMI 30.66 kg/m    Physical Exam Vitals reviewed.  Constitutional:      General: She is awake. She is not in acute distress.    Appearance: Normal appearance. She is well-developed and well-groomed. She is obese. She is not ill-appearing or diaphoretic.  HENT:     Head: Normocephalic and atraumatic.     Right Ear: Tympanic membrane, ear canal and external ear normal.     Left Ear: Tympanic membrane, ear canal and external ear normal.     Nose: Nose normal. No congestion or rhinorrhea.     Mouth/Throat:     Lips: Pink.     Mouth: Mucous membranes are moist.     Pharynx: Oropharynx is clear. Uvula midline. No oropharyngeal exudate or posterior oropharyngeal erythema.  Eyes:     General: Lids are normal. Vision grossly intact. Gaze aligned appropriately. No scleral icterus.       Right eye: No discharge.        Left eye: No discharge.     Extraocular Movements: Extraocular movements intact.     Conjunctiva/sclera: Conjunctivae normal.     Pupils: Pupils are equal, round, and reactive to light.     Funduscopic exam:    Right eye: Red reflex present.        Left eye:  Red reflex present. Neck:     Thyroid: No thyromegaly.     Vascular: No carotid bruit or JVD.     Trachea: Trachea and phonation normal. No tracheal deviation.  Cardiovascular:     Rate and Rhythm: Normal rate and regular rhythm.     Pulses: Normal pulses.     Heart sounds: Normal heart sounds, S1 normal and S2 normal. No murmur heard.    No friction rub. No gallop.  Pulmonary:     Effort: Pulmonary effort is normal. No accessory muscle usage or respiratory distress.     Breath sounds: Normal breath sounds and air entry. No stridor. No wheezing or rales.  Chest:     Chest wall: No tenderness.  Breasts:    Right: Normal. No swelling, bleeding, inverted nipple, mass, nipple discharge, skin change or tenderness.     Left: Normal. No swelling, bleeding, inverted nipple, mass, nipple discharge, skin change or tenderness.  Abdominal:     General: Bowel sounds are normal. There is no distension.     Palpations: Abdomen is soft. There is no shifting dullness, fluid wave, mass or pulsatile mass.     Tenderness: There is no abdominal tenderness. There is no guarding or rebound.  Musculoskeletal:        General: No tenderness or deformity. Normal range of motion.     Cervical back: Normal range of motion and neck supple.     Right lower leg: No edema.     Left lower leg: No edema.  Lymphadenopathy:     Cervical: No cervical adenopathy.     Upper Body:     Right upper body: No supraclavicular, axillary or pectoral adenopathy.     Left upper body: No supraclavicular, axillary or pectoral adenopathy.  Skin:    General: Skin is warm and dry.     Capillary Refill: Capillary refill takes less than 2 seconds.     Coloration: Skin is not pale.     Findings: No erythema or rash.  Neurological:     Mental Status: She  is alert and oriented to person, place, and time.     Cranial Nerves: No cranial nerve deficit.     Motor: No abnormal muscle tone.     Coordination: Coordination normal.     Gait:  Gait normal.     Deep Tendon Reflexes: Reflexes are normal and symmetric.  Psychiatric:        Mood and Affect: Mood normal.        Behavior: Behavior normal. Behavior is cooperative.        Thought Content: Thought content normal.        Judgment: Judgment normal.        Assessment/Plan: 1. Encounter for routine adult health examination with abnormal findings Age-appropriate preventive screenings and vaccinations discussed, annual physical exam completed. Routine labs for health maintenance ordered, see below. PHM updated.  - celecoxib (CELEBREX) 100 MG capsule; Take 100 mg by mouth 2 (two) times daily. - B12 and Folate Panel - Vitamin D (25 hydroxy) - CBC with Differential/Platelet - Lipid Profile - CMP14+EGFR  2. Impaired fasting glucose Routine labs ordered - CMP14+EGFR - Hgb A1C w/o eAG  3. Mixed hyperlipidemia Routine labs ordered - Lipid Profile - CMP14+EGFR  4. Vitamin D deficiency Routine lab ordered - Vitamin D (25 hydroxy)  5. B12 deficiency Routine labs ordered - B12 and Folate Panel - CBC with Differential/Platelet  6. Dysuria Routine urinalysis done - UA/M w/rflx Culture, Routine  7. Postmenopausal status, age-related Hormone labs ordered as per patient request.  - FSH/LH - Estradiol - Progesterone      General Counseling: Ilamae verbalizes understanding of the findings of todays visit and agrees with plan of treatment. I have discussed any further diagnostic evaluation that may be needed or ordered today. We also reviewed her medications today. she has been encouraged to call the office with any questions or concerns that should arise related to todays visit.    Orders Placed This Encounter  Procedures   UA/M w/rflx Culture, Routine   B12 and Folate Panel   Vitamin D (25 hydroxy)   CBC with Differential/Platelet   Lipid Profile   CMP14+EGFR   FSH/LH   Estradiol   Progesterone   Hgb A1C w/o eAG    No orders of the defined types  were placed in this encounter.   Return in about 6 months (around 11/08/2022) for F/U,  PCP and need a pap smear later this year, patient preference of visit .   Total time spent:30 Minutes Time spent includes review of chart, medications, test results, and follow up plan with the patient.   [place] Controlled Substance Database was reviewed by me.  This patient was seen by Jonetta Osgood, FNP-C in collaboration with Dr. Clayborn Bigness as a part of collaborative care agreement.   R. Valetta Fuller, MSN, FNP-C Internal medicine

## 2022-05-11 LAB — CMP14+EGFR
ALT: 15 IU/L (ref 0–32)
AST: 20 IU/L (ref 0–40)
Albumin/Globulin Ratio: 2 (ref 1.2–2.2)
Albumin: 4.6 g/dL (ref 3.9–4.9)
Alkaline Phosphatase: 71 IU/L (ref 44–121)
BUN/Creatinine Ratio: 25 (ref 12–28)
BUN: 22 mg/dL (ref 8–27)
Bilirubin Total: 0.5 mg/dL (ref 0.0–1.2)
CO2: 20 mmol/L (ref 20–29)
Calcium: 9.7 mg/dL (ref 8.7–10.3)
Chloride: 104 mmol/L (ref 96–106)
Creatinine, Ser: 0.89 mg/dL (ref 0.57–1.00)
Globulin, Total: 2.3 g/dL (ref 1.5–4.5)
Glucose: 87 mg/dL (ref 70–99)
Potassium: 4.3 mmol/L (ref 3.5–5.2)
Sodium: 141 mmol/L (ref 134–144)
Total Protein: 6.9 g/dL (ref 6.0–8.5)
eGFR: 73 mL/min/{1.73_m2} (ref >59)

## 2022-05-11 LAB — CBC WITH DIFFERENTIAL/PLATELET
Basophils Absolute: 0.1 10*3/uL (ref 0.0–0.2)
Basos: 1 %
EOS (ABSOLUTE): 0.1 10*3/uL (ref 0.0–0.4)
Eos: 2 %
Hematocrit: 40.9 % (ref 34.0–46.6)
Hemoglobin: 13.5 g/dL (ref 11.1–15.9)
Immature Grans (Abs): 0 10*3/uL (ref 0.0–0.1)
Immature Granulocytes: 0 %
Lymphocytes Absolute: 1.4 10*3/uL (ref 0.7–3.1)
Lymphs: 28 %
MCH: 30.4 pg (ref 26.6–33.0)
MCHC: 33 g/dL (ref 31.5–35.7)
MCV: 92 fL (ref 79–97)
Monocytes Absolute: 0.4 10*3/uL (ref 0.1–0.9)
Monocytes: 8 %
Neutrophils Absolute: 3.2 10*3/uL (ref 1.4–7.0)
Neutrophils: 61 %
Platelets: 173 10*3/uL (ref 150–450)
RBC: 4.44 x10E6/uL (ref 3.77–5.28)
RDW: 12.7 % (ref 11.7–15.4)
WBC: 5.2 10*3/uL (ref 3.4–10.8)

## 2022-05-11 LAB — VITAMIN D 25 HYDROXY (VIT D DEFICIENCY, FRACTURES): Vit D, 25-Hydroxy: 28.3 ng/mL — ABNORMAL LOW (ref 30.0–100.0)

## 2022-05-11 LAB — LIPID PANEL
Chol/HDL Ratio: 2.4 ratio (ref 0.0–4.4)
Cholesterol, Total: 230 mg/dL — ABNORMAL HIGH (ref 100–199)
HDL: 95 mg/dL (ref >39)
LDL Chol Calc (NIH): 120 mg/dL — ABNORMAL HIGH (ref 0–99)
Triglycerides: 88 mg/dL (ref 0–149)
VLDL Cholesterol Cal: 15 mg/dL (ref 5–40)

## 2022-05-11 LAB — B12 AND FOLATE PANEL
Folate: 12.1 ng/mL (ref >3.0)
Vitamin B-12: 512 pg/mL (ref 232–1245)

## 2022-05-11 LAB — HGB A1C W/O EAG: Hgb A1c MFr Bld: 5.5 % (ref 4.8–5.6)

## 2022-05-11 LAB — FSH/LH
FSH: 59.2 m[IU]/mL (ref 25.8–134.8)
LH: 32.8 m[IU]/mL (ref 7.7–58.5)

## 2022-05-11 LAB — PROGESTERONE: Progesterone: <0.1 ng/mL

## 2022-05-11 LAB — ESTRADIOL: Estradiol: <5 pg/mL (ref 0.0–54.7)

## 2022-05-12 LAB — UA/M W/RFLX CULTURE, ROUTINE
Bilirubin, UA: NEGATIVE
Glucose, UA: NEGATIVE
Ketones, UA: NEGATIVE
Nitrite, UA: NEGATIVE
Protein,UA: NEGATIVE
RBC, UA: NEGATIVE
Specific Gravity, UA: 1.026 (ref 1.005–1.030)
Urobilinogen, Ur: 0.2 mg/dL (ref 0.2–1.0)
pH, UA: 5.5 (ref 5.0–7.5)

## 2022-05-12 LAB — MICROSCOPIC EXAMINATION
Bacteria, UA: NONE SEEN
Casts: NONE SEEN /lpf

## 2022-05-12 LAB — URINE CULTURE, REFLEX

## 2022-05-13 IMAGING — CR DG CHEST 2V
2 series · 2 of 2 positions shown · non-contrast
Comparison: Chest radiographs 02/26/2018.

CLINICAL DATA: 58-year-old female with cough and shortness of
breath, positive for 3J4FE-Q1 11 days ago. Former smoker.

EXAM:
CHEST - 2 VIEW

[chest pa]
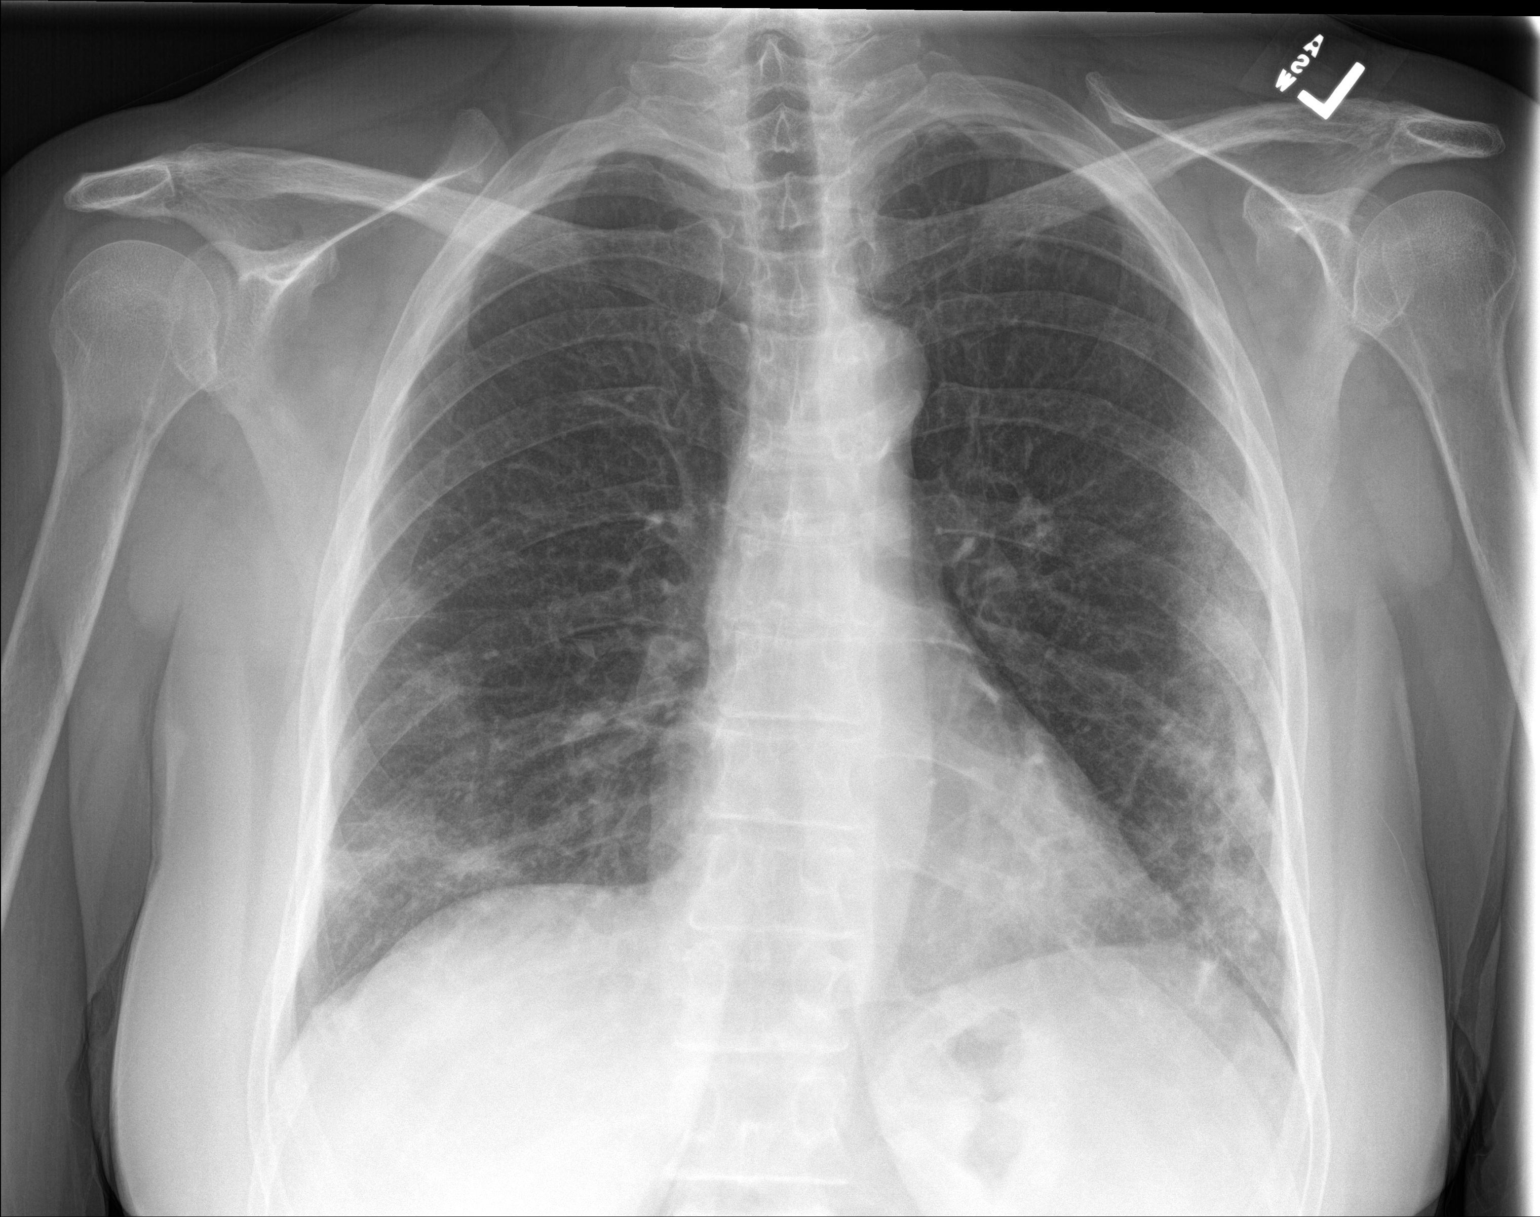

[chest lat]
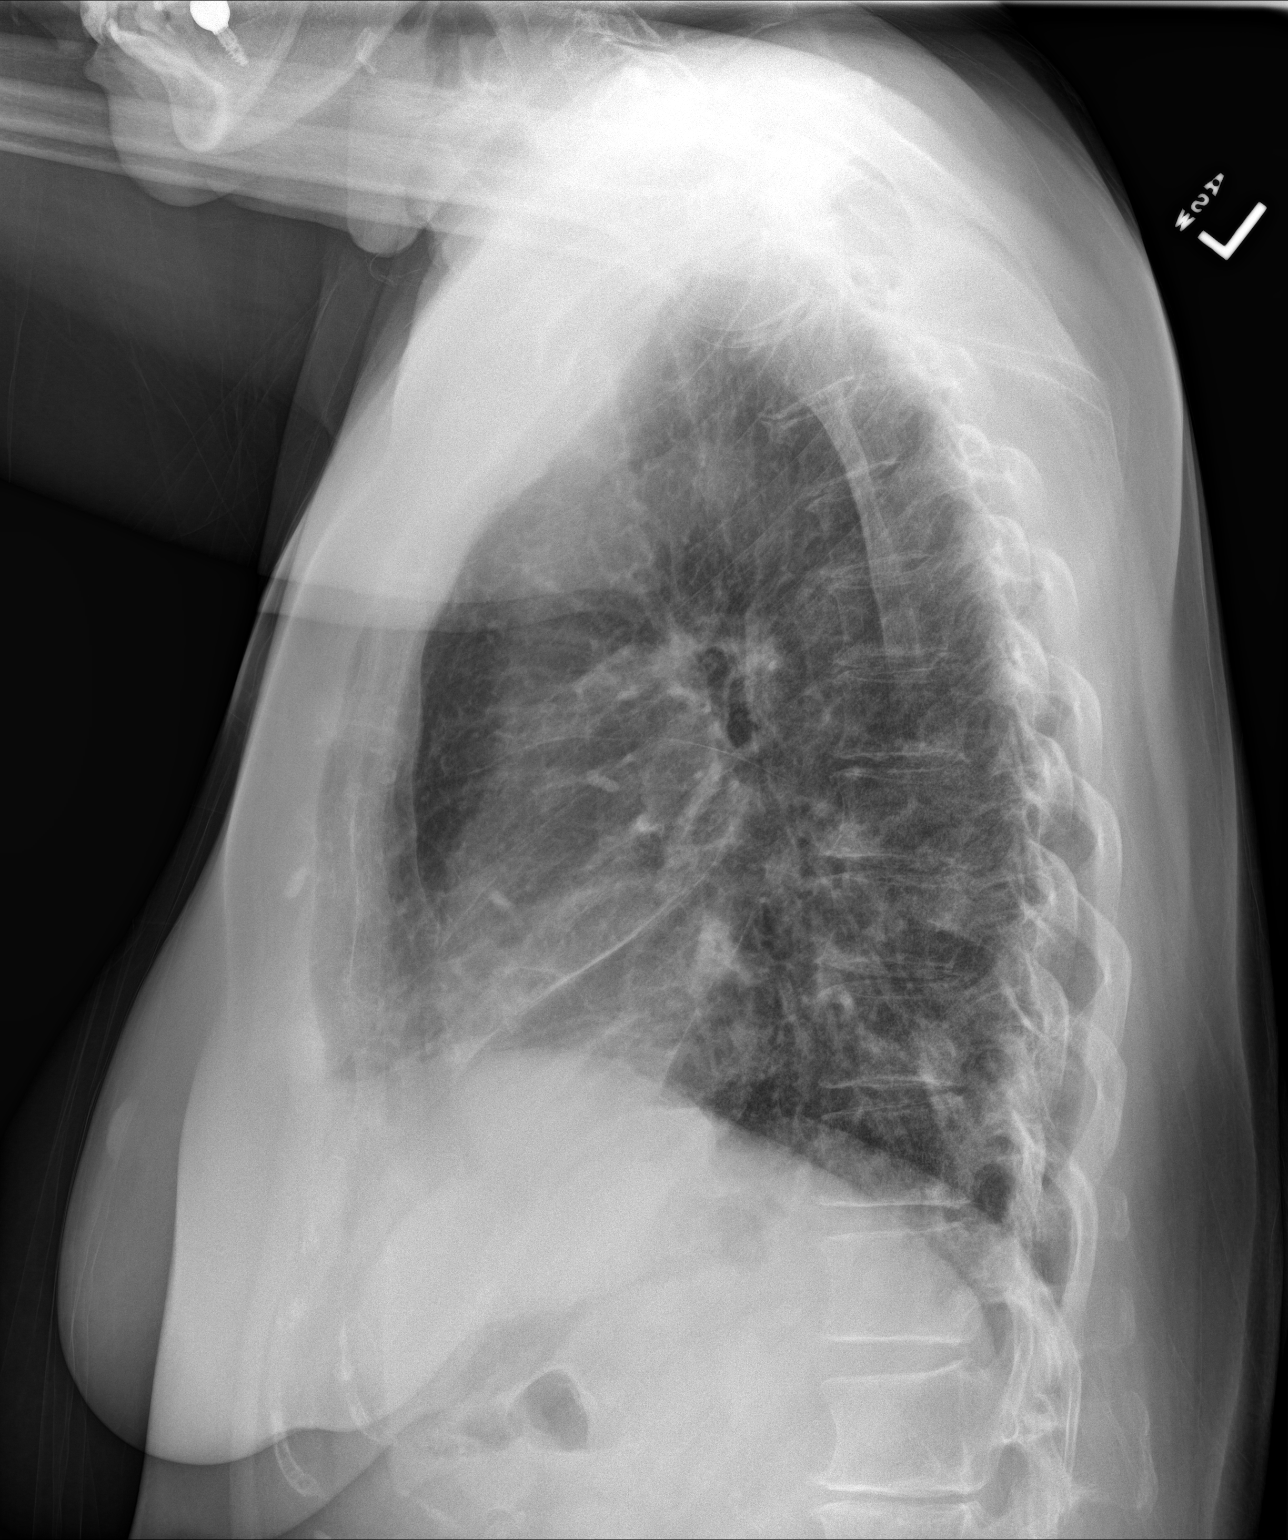

[2 of 2 positions shown; findings below may reference images not displayed]

FINDINGS: Bilateral patchy and indistinct peripheral and basilar pulmonary
opacity, new since 5601. No superimposed pneumothorax, pulmonary
edema or pleural effusion. There are underlying mild chronic
generalized increased interstitial markings.

Mediastinal contours remain normal. Visualized tracheal air column
is within normal limits. No acute osseous abnormality identified.
Negative visible bowel gas pattern.
IMPRESSION: Patchy and confluent bilateral peripheral pulmonary opacity typical
in appearance for 3J4FE-Q1 Pneumonia. No pleural effusion.

## 2022-07-22 ENCOUNTER — Telehealth: Payer: Self-pay

## 2022-07-24 MED ORDER — PHENDIMETRAZINE TARTRATE ER 105 MG PO CP24: 105.0000 mg | ORAL_CAPSULE | Freq: Every day | ORAL | 0 refills | Status: DC

## 2022-07-24 MED ORDER — NORETHINDRONE-ETH ESTRADIOL 1-5 MG-MCG PO TABS: 1.0000 | ORAL_TABLET | Freq: Every day | ORAL | 4 refills | Status: DC

## 2022-07-27 NOTE — Telephone Encounter (Signed)
Spoke with patient and she will see how she does in the next 30 days with the medicine then she will call and make an appointment.

## 2022-08-09 ENCOUNTER — Encounter: Payer: Self-pay | Admitting: Nurse Practitioner

## 2022-08-09 ENCOUNTER — Ambulatory Visit: Payer: 59 | Admitting: Nurse Practitioner

## 2022-08-09 VITALS — BP 139/77 | HR 67 | Temp 96.5°F | Resp 16 | Ht 68.5 in | Wt 206.8 lb

## 2022-08-09 DIAGNOSIS — Z9189 Other specified personal risk factors, not elsewhere classified: Secondary | ICD-10-CM | POA: Diagnosis not present

## 2022-08-09 DIAGNOSIS — E6609 Other obesity due to excess calories: Secondary | ICD-10-CM

## 2022-08-09 DIAGNOSIS — B3731 Acute candidiasis of vulva and vagina: Secondary | ICD-10-CM | POA: Diagnosis not present

## 2022-08-09 DIAGNOSIS — E782 Mixed hyperlipidemia: Secondary | ICD-10-CM

## 2022-08-09 DIAGNOSIS — M810 Age-related osteoporosis without current pathological fracture: Secondary | ICD-10-CM | POA: Diagnosis not present

## 2022-08-09 DIAGNOSIS — Z6831 Body mass index (BMI) 31.0-31.9, adult: Secondary | ICD-10-CM | POA: Diagnosis not present

## 2022-08-09 DIAGNOSIS — E559 Vitamin D deficiency, unspecified: Secondary | ICD-10-CM | POA: Diagnosis not present

## 2022-08-09 MED ORDER — CLIMARA PRO 0.045-0.015 MG/DAY TD PTWK: 1.0000 | MEDICATED_PATCH | TRANSDERMAL | 12 refills | Status: DC

## 2022-08-09 MED ORDER — FLUCONAZOLE 150 MG PO TABS
150.0000 mg | ORAL_TABLET | Freq: Once | ORAL | 0 refills | Status: AC
Start: 2022-08-09 — End: 2022-08-09

## 2022-08-09 MED ORDER — PHENDIMETRAZINE TARTRATE ER 105 MG PO CP24: 105.0000 mg | ORAL_CAPSULE | Freq: Every day | ORAL | 1 refills | Status: DC

## 2022-08-09 NOTE — Progress Notes (Signed)
[facility] [address] [place], [place] [address]  Internal MEDICINE  Office Visit Note  Patient Name: Ruth Morales  [id number]  [id number]  Date of Service: 08/09/2022  Chief Complaint  Patient presents with   Follow-up    HPI Kennidi presents for a follow-up visit for HRT, osteoporosis and weight loss.  --hormonal therapy with FEMHRT pill -- causing stomach cramps, yeast infection, and mood swings.  --takes calcium citrate twice daily for bone health --Refill phendimetrazine for weight loss which is helping to curb her appetite but she has been gaining about 0.2 lbs every day since starting the hormonal therapy. Currently in a weight loss group/challenge..  --Still wants HRT for osteoporosis prevention but wants to try a different medication  --reviewed labs -- slightly elevated cholesterol, slightly low vitamin D  Current Medication: Outpatient Encounter Medications as of 08/09/2022  Medication Sig   celecoxib (CELEBREX) 100 MG capsule Take 100 mg by mouth 2 (two) times daily.   cyclobenzaprine (FLEXERIL) 10 MG tablet Take 1 tablet (10 mg total) by mouth at bedtime.   diclofenac (VOLTAREN) 75 MG EC tablet Take 1 tablet (75 mg total) by mouth 2 (two) times daily.   ergocalciferol (DRISDOL) 1.25 MG (50000 UT) capsule Take 1 capsule (50,000 Units total) by mouth once a week.   estradiol-levonorgestrel (CLIMARA PRO) 0.045-0.015 MG/DAY Place 1 patch onto the skin once a week.   fluconazole (DIFLUCAN) 150 MG tablet Take 1 tablet (150 mg total) by mouth once for 1 dose. May take an additional dose after 3 days if still symptomatic.   ibuprofen (ADVIL,MOTRIN) 200 MG tablet Take 600 mg by mouth every 6 (six) hours as needed for headache or moderate pain.    loperamide (IMODIUM) 2 MG capsule Take 4 mg by mouth 2 (two) times daily.   [DISCONTINUED] norethindrone-ethinyl estradiol (FEMHRT 1/5) 1-5 MG-MCG TABS tablet Take 1 tablet by mouth daily.   [DISCONTINUED] Phendimetrazine  Tartrate 105 MG CP24 Take 1 capsule (105 mg total) by mouth daily before breakfast.   Phendimetrazine Tartrate 105 MG CP24 Take 1 capsule (105 mg total) by mouth daily before breakfast.   [DISCONTINUED] progesterone (PROMETRIUM) 100 MG capsule Take 1 capsule (100 mg total) by mouth daily.   No facility-administered encounter medications on file as of 08/09/2022.    Surgical History: Past Surgical History:  Procedure Laterality Date   BREAST BIOPSY Left 2003   needle bx-neg   BREAST SURGERY Bilateral 2004   breast reduction   CATARACT EXTRACTION W/PHACO Left 02/06/2018   Procedure: CATARACT EXTRACTION PHACO AND INTRAOCULAR LENS PLACEMENT (IOC);  Surgeon: Galen Manila, MD;  Location: [facility];  Service: Ophthalmology;  Laterality: Left;  [place] 00:31.5 CDE 3.49 Fluid pack Lot # [id number]   CATARACT EXTRACTION W/PHACO Right 11/03/2021   Procedure: CATARACT EXTRACTION PHACO AND INTRAOCULAR LENS PLACEMENT (IOC) RIGHT 5.83 00:35.4;  Surgeon: Galen Manila, MD;  Location: [facility];  Service: Ophthalmology;  Laterality: Right;   COLON SURGERY  2004   colon removed   CTR     EXTRACORPOREAL SHOCK WAVE LITHOTRIPSY Right 02/26/2015   Procedure: EXTRACORPOREAL SHOCK WAVE LITHOTRIPSY (ESWL);  Surgeon: Lorraine Lax, MD;  Location: [facility];  Service: Urology;  Laterality: Right;   REDUCTION MAMMAPLASTY Bilateral 2005    Medical History: Past Medical History:  Diagnosis Date   Anxiety 11/21/2013   Arthritis    Breath shortness 11/21/2013   Crohn disease (HCC)    Dizziness 11/21/2013   Extensor tenosynovitis of wrist 12/24/2013  Ganglion cyst of wrist 12/24/2013   Hypotension, postural 11/22/2013   Psoriasis    Skin cancer    Thyroid disease     Family History: Family History  Problem Relation Age of Onset   Diabetes Father    Diabetes Mother    Heart disease Maternal Grandmother    Breast cancer Neg Hx     Social History   Socioeconomic History   Marital  status: Married    Spouse name: Not on file   Number of children: Not on file   Years of education: Not on file   Highest education level: Not on file  Occupational History   Not on file  Tobacco Use   Smoking status: Former   Smokeless tobacco: Never  Vaping Use   Vaping Use: Never used  Substance and Sexual Activity   Alcohol use: Yes    Alcohol/week: 1.0 standard drink of alcohol    Types: 1 Standard drinks or equivalent per week    Comment: occasional drink, once a month   Drug use: No   Sexual activity: Yes    Partners: Male  Other Topics Concern   Not on file  Social History Narrative   Not on file   Social Determinants of Health   Financial Resource Strain: Not on file  Food Insecurity: Not on file  Transportation Needs: Not on file  Physical Activity: Not on file  Stress: Not on file  Social Connections: Not on file  Intimate Partner Violence: Not on file      Review of Systems  Constitutional:  Positive for unexpected weight change. Negative for chills and fatigue.  HENT: Negative.  Negative for congestion, rhinorrhea, sneezing and sore throat.   Eyes:  Negative for redness.  Respiratory: Negative.  Negative for cough, chest tightness, shortness of breath and wheezing.   Cardiovascular: Negative.  Negative for chest pain and palpitations.  Gastrointestinal:  Positive for abdominal pain (cramping). Negative for constipation, diarrhea, nausea and vomiting.  Genitourinary:  Positive for vaginal discharge. Negative for dysuria and frequency.       Yeast infection  Musculoskeletal:  Positive for arthralgias, back pain and neck pain. Negative for joint swelling.  Skin:  Negative for rash.  Neurological: Negative.  Negative for tremors and numbness.  Hematological:  Negative for adenopathy. Does not bruise/bleed easily.  Psychiatric/Behavioral:  Negative for behavioral problems (Depression), sleep disturbance and suicidal ideas. The patient is not nervous/anxious.         Mood swings     Vital Signs: BP 139/77   Pulse 67   Temp (!) 96.5 F (35.8 C)   Resp 16   Ht 5' 8.5" (1.74 m)   Wt 206 lb 12.8 oz (93.8 kg)   SpO2 99%   BMI 30.99 kg/m    Physical Exam Vitals reviewed.  Constitutional:      General: She is not in acute distress.    Appearance: Normal appearance. She is obese. She is not ill-appearing.  HENT:     Head: Normocephalic and atraumatic.  Eyes:     Pupils: Pupils are equal, round, and reactive to light.  Cardiovascular:     Rate and Rhythm: Normal rate and regular rhythm.  Pulmonary:     Effort: Pulmonary effort is normal. No respiratory distress.  Neurological:     Mental Status: She is alert and oriented to person, place, and time.  Psychiatric:        Mood and Affect: Mood normal.  Behavior: Behavior normal.        Assessment/Plan: 1. Vulvovaginal candidiasis Fluconazole prescribed for yeast infection.  - fluconazole (DIFLUCAN) 150 MG tablet; Take 1 tablet (150 mg total) by mouth once for 1 dose. May take an additional dose after 3 days if still symptomatic.  Dispense: 3 tablet; Refill: 0  2. Postmenopausal bone loss Discontinue FEMHRT pill. Start climara pro patch - estradiol-levonorgestrel (CLIMARA PRO) 0.045-0.015 MG/DAY; Place 1 patch onto the skin once a week.  Dispense: 4 patch; Refill: 12  3. Mixed hyperlipidemia No intervention necessary right now, encouraged patient to take OTC fish oil supplement if desired.   4. Vitamin D deficiency May take OTC vitamin D supplement  5. Class 1 obesity due to excess calories with serious comorbidity and body mass index (BMI) of 31.0 to 31.9 in adult Continue phendimetrazine x2 months, follow up in 8 weeks.  - Phendimetrazine Tartrate 105 MG CP24; Take 1 capsule (105 mg total) by mouth daily before breakfast.  Dispense: 30 capsule; Refill: 1  6. At risk for osteoporosis Stop PO FEMHRT medication. Start climarapro patch tomorrow.  -  estradiol-levonorgestrel (CLIMARA PRO) 0.045-0.015 MG/DAY; Place 1 patch onto the skin once a week.  Dispense: 4 patch; Refill: 12   General Counseling: Laquiesha verbalizes understanding of the findings of todays visit and agrees with plan of treatment. I have discussed any further diagnostic evaluation that may be needed or ordered today. We also reviewed her medications today. she has been encouraged to call the office with any questions or concerns that should arise related to todays visit.    No orders of the defined types were placed in this encounter.   Meds ordered this encounter  Medications   estradiol-levonorgestrel (CLIMARA PRO) 0.045-0.015 MG/DAY    Sig: Place 1 patch onto the skin once a week.    Dispense:  4 patch    Refill:  12    Discuss FEMHRT, and fill new script for climarapro   Phendimetrazine Tartrate 105 MG CP24    Sig: Take 1 capsule (105 mg total) by mouth daily before breakfast.    Dispense:  30 capsule    Refill:  1    Run through goodrx   fluconazole (DIFLUCAN) 150 MG tablet    Sig: Take 1 tablet (150 mg total) by mouth once for 1 dose. May take an additional dose after 3 days if still symptomatic.    Dispense:  3 tablet    Refill:  0    Return in about 8 weeks (around 10/04/2022) for F/U,  PCP, Weight loss.   Total time spent:30 Minutes Time spent includes review of chart, medications, test results, and follow up plan with the patient.   [place] Controlled Substance Database was reviewed by me.  This patient was seen by Sallyanne Kuster, FNP-C in collaboration with Dr. Beverely Risen as a part of collaborative care agreement.    R. Tedd Sias, MSN, FNP-C Internal medicine

## 2022-10-06 ENCOUNTER — Encounter: Payer: Self-pay | Admitting: Nurse Practitioner

## 2022-10-06 ENCOUNTER — Ambulatory Visit: Payer: 59 | Admitting: Nurse Practitioner

## 2022-10-06 VITALS — BP 128/86 | HR 67 | Temp 98.1°F | Resp 16 | Ht 68.5 in | Wt 199.8 lb

## 2022-10-06 DIAGNOSIS — R252 Cramp and spasm: Secondary | ICD-10-CM

## 2022-10-06 DIAGNOSIS — Z6831 Body mass index (BMI) 31.0-31.9, adult: Secondary | ICD-10-CM

## 2022-10-06 DIAGNOSIS — M810 Age-related osteoporosis without current pathological fracture: Secondary | ICD-10-CM | POA: Diagnosis not present

## 2022-10-06 DIAGNOSIS — E6609 Other obesity due to excess calories: Secondary | ICD-10-CM | POA: Diagnosis not present

## 2022-10-06 MED ORDER — DIETHYLPROPION HCL ER 75 MG PO TB24
75.0000 mg | ORAL_TABLET | Freq: Every day | ORAL | 1 refills | Status: DC
Start: 2022-10-06 — End: 2022-12-01

## 2022-10-06 NOTE — Progress Notes (Signed)
[facility] [address] [place], [place] [address]  Internal MEDICINE  Office Visit Note  Patient Name: Ruth Morales  [id number]  [id number]  Date of Service: 10/06/2022  Chief Complaint  Patient presents with   Follow-up    Weight loss     HPI [name] presents for a follow-up visit for muscle cramps, weight loss and hypertension.  Weight loss -- 198 lbs on home scale today. Doing well, wants to continue with weight loss but interested in trying a different medication instead of phendimetrazine. Muscle cramps -- day and night but work at night in legs, back and hands. Has been eating more foods high in potassium but it is not helping.  Postmenopausal bone loss -- wearing climaropro patch weekly.   Not ready for pap today, will do at next visit.     Current Medication: Outpatient Encounter Medications as of 10/06/2022  Medication Sig   celecoxib (CELEBREX) 100 MG capsule Take 100 mg by mouth 2 (two) times daily.   cyclobenzaprine (FLEXERIL) 10 MG tablet Take 1 tablet (10 mg total) by mouth at bedtime.   diclofenac (VOLTAREN) 75 MG EC tablet Take 1 tablet (75 mg total) by mouth 2 (two) times daily.   Diethylpropion HCl CR 75 MG TB24 Take 1 tablet (75 mg total) by mouth daily.   ergocalciferol (DRISDOL) 1.25 MG (50000 UT) capsule Take 1 capsule (50,000 Units total) by mouth once a week.   estradiol-levonorgestrel (CLIMARA PRO) 0.045-0.015 MG/DAY Place 1 patch onto the skin once a week.   ibuprofen (ADVIL,MOTRIN) 200 MG tablet Take 600 mg by mouth every 6 (six) hours as needed for headache or moderate pain.    loperamide (IMODIUM) 2 MG capsule Take 4 mg by mouth 2 (two) times daily.   [DISCONTINUED] Phendimetrazine Tartrate 105 MG CP24 Take 1 capsule (105 mg total) by mouth daily before breakfast.   [DISCONTINUED] progesterone (PROMETRIUM) 100 MG capsule Take 1 capsule (100 mg total) by mouth daily.   No facility-administered encounter medications on file as of 10/06/2022.     Surgical History: Past Surgical History:  Procedure Laterality Date   BREAST BIOPSY Left 2003   needle bx-neg   BREAST SURGERY Bilateral 2004   breast reduction   CATARACT EXTRACTION W/PHACO Left 02/06/2018   Procedure: CATARACT EXTRACTION PHACO AND INTRAOCULAR LENS PLACEMENT (IOC);  Surgeon: Galen Manila, MD;  Location: [facility];  Service: Ophthalmology;  Laterality: Left;  [place] 00:31.5 CDE 3.49 Fluid pack Lot # [id number]   CATARACT EXTRACTION W/PHACO Right 11/03/2021   Procedure: CATARACT EXTRACTION PHACO AND INTRAOCULAR LENS PLACEMENT (IOC) RIGHT 5.83 00:35.4;  Surgeon: Galen Manila, MD;  Location: [facility];  Service: Ophthalmology;  Laterality: Right;   COLON SURGERY  2004   colon removed   CTR     EXTRACORPOREAL SHOCK WAVE LITHOTRIPSY Right 02/26/2015   Procedure: EXTRACORPOREAL SHOCK WAVE LITHOTRIPSY (ESWL);  Surgeon: Lorraine Lax, MD;  Location: [facility];  Service: Urology;  Laterality: Right;   REDUCTION MAMMAPLASTY Bilateral 2005    Medical History: Past Medical History:  Diagnosis Date   Anxiety 11/21/2013   Arthritis    Breath shortness 11/21/2013   Crohn disease (HCC)    Dizziness 11/21/2013   Extensor tenosynovitis of wrist 12/24/2013   Ganglion cyst of wrist 12/24/2013   Hypotension, postural 11/22/2013   Psoriasis    Skin cancer    Thyroid disease     Family History: Family History  Problem Relation Age of Onset   Diabetes Father  Diabetes Mother    Heart disease Maternal Grandmother    Breast cancer Neg Hx     Social History   Socioeconomic History   Marital status: Married    Spouse name: Not on file   Number of children: Not on file   Years of education: Not on file   Highest education level: Not on file  Occupational History   Not on file  Tobacco Use   Smoking status: Former   Smokeless tobacco: Never  Vaping Use   Vaping Use: Never used  Substance and Sexual Activity   Alcohol use: Yes    Alcohol/week: 1.0  standard drink of alcohol    Types: 1 Standard drinks or equivalent per week    Comment: occasional drink, once a month   Drug use: No   Sexual activity: Yes    Partners: Male  Other Topics Concern   Not on file  Social History Narrative   Not on file   Social Determinants of Health   Financial Resource Strain: Not on file  Food Insecurity: Not on file  Transportation Needs: Not on file  Physical Activity: Not on file  Stress: Not on file  Social Connections: Not on file  Intimate Partner Violence: Not on file      Review of Systems  Constitutional:  Positive for appetite change and unexpected weight change. Negative for chills and fatigue.  HENT:  Negative for congestion, rhinorrhea, sneezing and sore throat.   Eyes:  Negative for redness.  Respiratory:  Negative for cough, chest tightness, shortness of breath and wheezing.   Cardiovascular: Negative.  Negative for chest pain and palpitations.  Gastrointestinal:  Negative for abdominal pain, constipation, diarrhea, nausea and vomiting.  Genitourinary:  Negative for dysuria and frequency.  Musculoskeletal:  Positive for arthralgias, back pain and neck pain. Negative for joint swelling.  Skin:  Negative for rash.  Neurological: Negative.  Negative for tremors and numbness.  Hematological:  Negative for adenopathy. Does not bruise/bleed easily.  Psychiatric/Behavioral:  Negative for behavioral problems (Depression), sleep disturbance and suicidal ideas. The patient is not nervous/anxious.     Vital Signs: BP 128/86   Pulse 67   Temp 98.1 F (36.7 C)   Resp 16   Ht 5' 8.5" (1.74 m)   Wt 199 lb 12.8 oz (90.6 kg)   SpO2 97%   BMI 29.94 kg/m    Physical Exam Vitals reviewed.  Constitutional:      General: She is not in acute distress.    Appearance: Normal appearance. She is obese. She is not ill-appearing.  HENT:     Head: Normocephalic and atraumatic.  Eyes:     Pupils: Pupils are equal, round, and reactive to  light.  Cardiovascular:     Rate and Rhythm: Normal rate and regular rhythm.  Pulmonary:     Effort: Pulmonary effort is normal. No respiratory distress.  Neurological:     Mental Status: She is alert and oriented to person, place, and time.  Psychiatric:        Mood and Affect: Mood normal.        Behavior: Behavior normal.        Assessment/Plan: 1. Muscle cramps Repeat potassium level. Start OTC magnesium oxide 400 mg at bedtime.  - Potassium  2. Postmenopausal bone loss Continue climarapro patch as prescribed.   3. Class 1 obesity due to excess calories with serious comorbidity and body mass index (BMI) of 31.0 to 31.9 in adult Stop phendimetrazine. Start  diethylpropion as prescribed. Follow up in 8 weeks. - Diethylpropion HCl CR 75 MG TB24; Take 1 tablet (75 mg total) by mouth daily.  Dispense: 30 tablet; Refill: 1   General Counseling: Kenitha verbalizes understanding of the findings of todays visit and agrees with plan of treatment. I have discussed any further diagnostic evaluation that may be needed or ordered today. We also reviewed her medications today. she has been encouraged to call the office with any questions or concerns that should arise related to todays visit.    Orders Placed This Encounter  Procedures   Potassium    Meds ordered this encounter  Medications   Diethylpropion HCl CR 75 MG TB24    Sig: Take 1 tablet (75 mg total) by mouth daily.    Dispense:  30 tablet    Refill:  1    Do not run through insurance, will have goodrx coupon    Return in about 8 weeks (around 12/01/2022) for F/U, Weight loss,  PCP.   Total time spent:30 Minutes Time spent includes review of chart, medications, test results, and follow up plan with the patient.   [place] Controlled Substance Database was reviewed by me.  This patient was seen by Sallyanne Kuster, FNP-C in collaboration with Dr. Beverely Risen as a part of collaborative care agreement.    R.  Tedd Sias, MSN, FNP-C Internal medicine

## 2022-10-07 ENCOUNTER — Telehealth: Payer: Self-pay

## 2022-10-07 LAB — POTASSIUM: Potassium: 4.6 mmol/L (ref 3.5–5.2)

## 2022-10-07 NOTE — Progress Notes (Signed)
Potassium level is ok

## 2022-10-07 NOTE — Telephone Encounter (Signed)
-----   Message from Sallyanne Kuster, NP sent at 10/07/2022  7:15 AM EDT ----- Potassium level is ok

## 2022-10-07 NOTE — Telephone Encounter (Signed)
Patient notified

## 2022-11-08 ENCOUNTER — Ambulatory Visit: Payer: 59 | Admitting: Nurse Practitioner

## 2022-11-24 ENCOUNTER — Telehealth: Payer: 59 | Admitting: Nurse Practitioner

## 2022-11-24 ENCOUNTER — Encounter: Payer: Self-pay | Admitting: Nurse Practitioner

## 2022-11-24 ENCOUNTER — Telehealth: Payer: Self-pay

## 2022-11-24 VITALS — Resp 16 | Ht 68.5 in | Wt 194.0 lb

## 2022-11-24 DIAGNOSIS — U071 COVID-19: Secondary | ICD-10-CM | POA: Diagnosis not present

## 2022-11-24 DIAGNOSIS — J069 Acute upper respiratory infection, unspecified: Secondary | ICD-10-CM

## 2022-11-24 MED ORDER — AZITHROMYCIN 250 MG PO TABS
ORAL_TABLET | ORAL | 0 refills | Status: AC
Start: 2022-11-24 — End: 2022-11-29

## 2022-11-24 NOTE — Telephone Encounter (Signed)
Spoke with pt and made her appt today  

## 2022-11-24 NOTE — Progress Notes (Signed)
[facility] [address] [place], [place] [address]  Internal MEDICINE  Telephone Visit  Patient Name: Ruth Morales  [id number]  [id number]  Date of Service: 11/24/2022  I connected with the patient at 1115 by telephone and verified the patients identity using two identifiers.   I discussed the limitations, risks, security and privacy concerns of performing an evaluation and management service by telephone and the availability of in person appointments. I also discussed with the patient that there may be a patient responsible charge related to the service.  The patient expressed understanding and agrees to proceed.    Chief Complaint  Patient presents with   Telephone Screen    Covid positive- Covid test this morning   Telephone Assessment   Headache   Cough    HPI Ruth Morales presents for a telehealth virtual visit for covid positive this morning. Has headache and cough  Onset of symptoms was Sunday.  Reports hoarseness, ear pain, sinus pressure, nasal congestion, sinus drainage.    Current Medication: Outpatient Encounter Medications as of 11/24/2022  Medication Sig   azithromycin (ZITHROMAX) 250 MG tablet Take 2 tablets on day 1, then 1 tablet daily on days 2 through 5   celecoxib (CELEBREX) 100 MG capsule Take 100 mg by mouth 2 (two) times daily.   cyclobenzaprine (FLEXERIL) 10 MG tablet Take 1 tablet (10 mg total) by mouth at bedtime.   diclofenac (VOLTAREN) 75 MG EC tablet Take 1 tablet (75 mg total) by mouth 2 (two) times daily.   Diethylpropion HCl CR 75 MG TB24 Take 1 tablet (75 mg total) by mouth daily.   ergocalciferol (DRISDOL) 1.25 MG (50000 UT) capsule Take 1 capsule (50,000 Units total) by mouth once a week.   estradiol-levonorgestrel (CLIMARA PRO) 0.045-0.015 MG/DAY Place 1 patch onto the skin once a week.   ibuprofen (ADVIL,MOTRIN) 200 MG tablet Take 600 mg by mouth every 6 (six) hours as needed for headache or moderate pain.    loperamide (IMODIUM) 2 MG  capsule Take 4 mg by mouth 2 (two) times daily.   [DISCONTINUED] progesterone (PROMETRIUM) 100 MG capsule Take 1 capsule (100 mg total) by mouth daily.   No facility-administered encounter medications on file as of 11/24/2022.    Surgical History: Past Surgical History:  Procedure Laterality Date   BREAST BIOPSY Left 2003   needle bx-neg   BREAST SURGERY Bilateral 2004   breast reduction   CATARACT EXTRACTION W/PHACO Left 02/06/2018   Procedure: CATARACT EXTRACTION PHACO AND INTRAOCULAR LENS PLACEMENT (IOC);  Surgeon: Galen Manila, MD;  Location: [facility];  Service: Ophthalmology;  Laterality: Left;  [place] 00:31.5 CDE 3.49 Fluid pack Lot # [id number]   CATARACT EXTRACTION W/PHACO Right 11/03/2021   Procedure: CATARACT EXTRACTION PHACO AND INTRAOCULAR LENS PLACEMENT (IOC) RIGHT 5.83 00:35.4;  Surgeon: Galen Manila, MD;  Location: [facility];  Service: Ophthalmology;  Laterality: Right;   COLON SURGERY  2004   colon removed   CTR     EXTRACORPOREAL SHOCK WAVE LITHOTRIPSY Right 02/26/2015   Procedure: EXTRACORPOREAL SHOCK WAVE LITHOTRIPSY (ESWL);  Surgeon: Lorraine Lax, MD;  Location: [facility];  Service: Urology;  Laterality: Right;   REDUCTION MAMMAPLASTY Bilateral 2005    Medical History: Past Medical History:  Diagnosis Date   Anxiety 11/21/2013   Arthritis    Breath shortness 11/21/2013   Crohn disease (HCC)    Dizziness 11/21/2013   Extensor tenosynovitis of wrist 12/24/2013   Ganglion cyst of wrist 12/24/2013   Hypotension, postural  11/22/2013   Psoriasis    Skin cancer    Thyroid disease     Family History: Family History  Problem Relation Age of Onset   Diabetes Father    Diabetes Mother    Heart disease Maternal Grandmother    Breast cancer Neg Hx     Social History   Socioeconomic History   Marital status: Married    Spouse name: Not on file   Number of children: Not on file   Years of education: Not on file   Highest education level:  Not on file  Occupational History   Not on file  Tobacco Use   Smoking status: Former   Smokeless tobacco: Never  Vaping Use   Vaping status: Never Used  Substance and Sexual Activity   Alcohol use: Yes    Alcohol/week: 1.0 standard drink of alcohol    Types: 1 Standard drinks or equivalent per week    Comment: occasional drink, once a month   Drug use: No   Sexual activity: Yes    Partners: Male  Other Topics Concern   Not on file  Social History Narrative   Not on file   Social Determinants of Health   Financial Resource Strain: Not on file  Food Insecurity: Not on file  Transportation Needs: Not on file  Physical Activity: Not on file  Stress: Not on file  Social Connections: Not on file  Intimate Partner Violence: Not on file      Review of Systems  Constitutional:  Positive for fatigue. Negative for appetite change and fever.  HENT:  Positive for congestion, ear pain, postnasal drip, rhinorrhea, sinus pressure, sinus pain and voice change.   Respiratory:  Positive for cough. Negative for chest tightness, shortness of breath and wheezing.   Cardiovascular: Negative.  Negative for chest pain and palpitations.  Gastrointestinal:  Negative for nausea.  Neurological:  Positive for headaches.    Vital Signs: Resp 16   Ht 5' 8.5" (1.74 m)   Wt 194 lb (88 kg)   BMI 29.07 kg/m    Observation/Objective: She is alert and oriented. Her voice is hoarse. No acute distress noted.     Assessment/Plan: 1. Upper respiratory tract infection due to COVID-19 virus Declined paxlovid and steroid taper after discussing with provider. Zpak prescribed.  - azithromycin (ZITHROMAX) 250 MG tablet; Take 2 tablets on day 1, then 1 tablet daily on days 2 through 5  Dispense: 6 tablet; Refill: 0   General Counseling: [name] verbalizes understanding of the findings of today's phone visit and agrees with plan of treatment. I have discussed any further diagnostic evaluation that may be  needed or ordered today. We also reviewed her medications today. she has been encouraged to call the office with any questions or concerns that should arise related to todays visit.  Return if symptoms worsen or fail to improve.   No orders of the defined types were placed in this encounter.   Meds ordered this encounter  Medications   azithromycin (ZITHROMAX) 250 MG tablet    Sig: Take 2 tablets on day 1, then 1 tablet daily on days 2 through 5    Dispense:  6 tablet    Refill:  0    Time spent:10 Minutes Time spent with patient included reviewing progress notes, labs, imaging studies, and discussing plan for follow up.  [place] Controlled Substance Database was reviewed by me for overdose risk score (ORS) if appropriate.  This patient was seen by Arlyss Repress  , FNP-C in collaboration with Dr. Beverely Risen as a part of collaborative care agreement.   R. Tedd Sias, MSN, FNP-C Internal medicine

## 2022-12-01 ENCOUNTER — Ambulatory Visit: Payer: 59 | Admitting: Nurse Practitioner

## 2022-12-01 ENCOUNTER — Encounter: Payer: Self-pay | Admitting: Nurse Practitioner

## 2022-12-01 VITALS — BP 130/76 | HR 86 | Temp 97.5°F | Resp 16 | Ht 68.5 in | Wt 196.6 lb

## 2022-12-01 DIAGNOSIS — E6609 Other obesity due to excess calories: Secondary | ICD-10-CM

## 2022-12-01 DIAGNOSIS — N951 Menopausal and female climacteric states: Secondary | ICD-10-CM

## 2022-12-01 DIAGNOSIS — M810 Age-related osteoporosis without current pathological fracture: Secondary | ICD-10-CM | POA: Diagnosis not present

## 2022-12-01 MED ORDER — ESTRADIOL 0.05 MG/24HR TD PTWK
0.0500 mg | MEDICATED_PATCH | TRANSDERMAL | 12 refills | Status: DC
Start: 2022-12-01 — End: 2023-05-10

## 2022-12-01 MED ORDER — MEDROXYPROGESTERONE ACETATE 2.5 MG PO TABS
2.5000 mg | ORAL_TABLET | Freq: Every day | ORAL | 3 refills | Status: DC
Start: 2022-12-01 — End: 2023-04-19

## 2022-12-01 MED ORDER — DIETHYLPROPION HCL ER 75 MG PO TB24
75.0000 mg | ORAL_TABLET | Freq: Every day | ORAL | 1 refills | Status: DC
Start: 2022-12-01 — End: 2023-01-26

## 2022-12-01 NOTE — Progress Notes (Signed)
[facility] [address] [place], [place] [address]  Internal MEDICINE  Office Visit Note  Patient Name: Ruth Morales  [id number]  [id number]  Date of Service: 12/01/2022  Chief Complaint  Patient presents with   Follow-up    Weight loss     HPI [name] presents for a follow-up visit for vasomotor symptoms, postmenopausal bone loss and weight loss management.  Weight loss -- weight on home scale is 194 lbs today. lost 5 lbs, still working on this, next goal is to get below 190 lbs. Wants to continue medication. Taking diethylpropion. Feels like it helps curb her appetite but does not interrupt her sleep at night. Feeling much better, had covid over a week ago.  BP and heart rate are in normal range. Has been using the climarapro patch for vasomotor symptoms and prevention of postmenopausal bone loss. The patch will not stay on her skin, would like to try something else.     Current Medication: Outpatient Encounter Medications as of 12/01/2022  Medication Sig   celecoxib (CELEBREX) 100 MG capsule Take 100 mg by mouth 2 (two) times daily.   cyclobenzaprine (FLEXERIL) 10 MG tablet Take 1 tablet (10 mg total) by mouth at bedtime.   diclofenac (VOLTAREN) 75 MG EC tablet Take 1 tablet (75 mg total) by mouth 2 (two) times daily.   ergocalciferol (DRISDOL) 1.25 MG (50000 UT) capsule Take 1 capsule (50,000 Units total) by mouth once a week.   estradiol (CLIMARA - DOSED IN MG/24 HR) 0.05 mg/24hr patch Place 1 patch (0.05 mg total) onto the skin once a week.   ibuprofen (ADVIL,MOTRIN) 200 MG tablet Take 600 mg by mouth every 6 (six) hours as needed for headache or moderate pain.    loperamide (IMODIUM) 2 MG capsule Take 4 mg by mouth 2 (two) times daily.   medroxyPROGESTERone (PROVERA) 2.5 MG tablet Take 1 tablet (2.5 mg total) by mouth daily.   [DISCONTINUED] Diethylpropion HCl CR 75 MG TB24 Take 1 tablet (75 mg total) by mouth daily.   [DISCONTINUED] estradiol-levonorgestrel  (CLIMARA PRO) 0.045-0.015 MG/DAY Place 1 patch onto the skin once a week.   Diethylpropion HCl CR 75 MG TB24 Take 1 tablet (75 mg total) by mouth daily.   [DISCONTINUED] progesterone (PROMETRIUM) 100 MG capsule Take 1 capsule (100 mg total) by mouth daily.   No facility-administered encounter medications on file as of 12/01/2022.    Surgical History: Past Surgical History:  Procedure Laterality Date   BREAST BIOPSY Left 2003   needle bx-neg   BREAST SURGERY Bilateral 2004   breast reduction   CATARACT EXTRACTION W/PHACO Left 02/06/2018   Procedure: CATARACT EXTRACTION PHACO AND INTRAOCULAR LENS PLACEMENT (IOC);  Surgeon: Galen Manila, MD;  Location: [facility];  Service: Ophthalmology;  Laterality: Left;  [place] 00:31.5 CDE 3.49 Fluid pack Lot # [id number]   CATARACT EXTRACTION W/PHACO Right 11/03/2021   Procedure: CATARACT EXTRACTION PHACO AND INTRAOCULAR LENS PLACEMENT (IOC) RIGHT 5.83 00:35.4;  Surgeon: Galen Manila, MD;  Location: [facility];  Service: Ophthalmology;  Laterality: Right;   COLON SURGERY  2004   colon removed   CTR     EXTRACORPOREAL SHOCK WAVE LITHOTRIPSY Right 02/26/2015   Procedure: EXTRACORPOREAL SHOCK WAVE LITHOTRIPSY (ESWL);  Surgeon: Lorraine Lax, MD;  Location: [facility];  Service: Urology;  Laterality: Right;   REDUCTION MAMMAPLASTY Bilateral 2005    Medical History: Past Medical History:  Diagnosis Date   Anxiety 11/21/2013   Arthritis    Breath shortness  11/21/2013   Crohn disease (HCC)    Dizziness 11/21/2013   Extensor tenosynovitis of wrist 12/24/2013   Ganglion cyst of wrist 12/24/2013   Hypotension, postural 11/22/2013   Psoriasis    Skin cancer    Thyroid disease     Family History: Family History  Problem Relation Age of Onset   Diabetes Father    Diabetes Mother    Heart disease Maternal Grandmother    Breast cancer Neg Hx     Social History   Socioeconomic History   Marital status: Married    Spouse name: Not on  file   Number of children: Not on file   Years of education: Not on file   Highest education level: Not on file  Occupational History   Not on file  Tobacco Use   Smoking status: Former   Smokeless tobacco: Never  Vaping Use   Vaping status: Never Used  Substance and Sexual Activity   Alcohol use: Yes    Alcohol/week: 1.0 standard drink of alcohol    Types: 1 Standard drinks or equivalent per week    Comment: occasional drink, once a month   Drug use: No   Sexual activity: Yes    Partners: Male  Other Topics Concern   Not on file  Social History Narrative   Not on file   Social Determinants of Health   Financial Resource Strain: Not on file  Food Insecurity: Not on file  Transportation Needs: Not on file  Physical Activity: Not on file  Stress: Not on file  Social Connections: Not on file  Intimate Partner Violence: Not on file      Review of Systems  Constitutional:  Positive for appetite change and unexpected weight change. Negative for chills and fatigue.  HENT:  Negative for congestion, rhinorrhea, sneezing and sore throat.   Eyes:  Negative for redness.  Respiratory:  Negative for cough, chest tightness, shortness of breath and wheezing.   Cardiovascular: Negative.  Negative for chest pain and palpitations.  Gastrointestinal:  Negative for abdominal pain, constipation, diarrhea, nausea and vomiting.  Genitourinary:  Negative for dysuria and frequency.  Musculoskeletal:  Positive for arthralgias, back pain and neck pain. Negative for joint swelling.  Skin:  Negative for rash.  Neurological: Negative.  Negative for tremors and numbness.  Hematological:  Negative for adenopathy. Does not bruise/bleed easily.  Psychiatric/Behavioral:  Negative for behavioral problems (Depression), sleep disturbance and suicidal ideas. The patient is not nervous/anxious.     Vital Signs: BP 130/76   Pulse 86   Temp (!) 97.5 F (36.4 C)   Resp 16   Ht 5' 8.5" (1.74 m)   Wt  196 lb 9.6 oz (89.2 kg)   SpO2 98%   BMI 29.46 kg/m    Physical Exam Vitals reviewed.  Constitutional:      General: She is not in acute distress.    Appearance: Normal appearance. She is obese. She is not ill-appearing.  HENT:     Head: Normocephalic and atraumatic.  Eyes:     Pupils: Pupils are equal, round, and reactive to light.  Cardiovascular:     Rate and Rhythm: Normal rate and regular rhythm.  Pulmonary:     Effort: Pulmonary effort is normal. No respiratory distress.  Neurological:     Mental Status: She is alert and oriented to person, place, and time.  Psychiatric:        Mood and Affect: Mood normal.  Behavior: Behavior normal.        Assessment/Plan: 1. Postmenopausal bone loss Switch to climara patch weekly and medroxyprogesterone tablet daily - estradiol (CLIMARA - DOSED IN MG/24 HR) 0.05 mg/24hr patch; Place 1 patch (0.05 mg total) onto the skin once a week.  Dispense: 4 patch; Refill: 12 - medroxyPROGESTERone (PROVERA) 2.5 MG tablet; Take 1 tablet (2.5 mg total) by mouth daily.  Dispense: 30 tablet; Refill: 3  2. Vasomotor symptoms due to menopause Switch climara pro to climara patch and take the medroxyprogesterone tablet daily. We will see if this patch will stay on her skin better and help control her symptoms.  - estradiol (CLIMARA - DOSED IN MG/24 HR) 0.05 mg/24hr patch; Place 1 patch (0.05 mg total) onto the skin once a week.  Dispense: 4 patch; Refill: 12 - medroxyPROGESTERone (PROVERA) 2.5 MG tablet; Take 1 tablet (2.5 mg total) by mouth daily.  Dispense: 30 tablet; Refill: 3  3. Class 1 obesity due to excess calories with serious comorbidity and body mass index (BMI) of 31.0 to 31.9 in adult Continue diethylpropion as prescribed. Continue diet and lifestyle modifications as discussed. Follow up in 8 weeks  - Diethylpropion HCl CR 75 MG TB24; Take 1 tablet (75 mg total) by mouth daily.  Dispense: 30 tablet; Refill: 1   General Counseling:  Layci verbalizes understanding of the findings of todays visit and agrees with plan of treatment. I have discussed any further diagnostic evaluation that may be needed or ordered today. We also reviewed her medications today. she has been encouraged to call the office with any questions or concerns that should arise related to todays visit.    No orders of the defined types were placed in this encounter.   Meds ordered this encounter  Medications   estradiol (CLIMARA - DOSED IN MG/24 HR) 0.05 mg/24hr patch    Sig: Place 1 patch (0.05 mg total) onto the skin once a week.    Dispense:  4 patch    Refill:  12    Discontinue climara pro, fill new script today.   medroxyPROGESTERone (PROVERA) 2.5 MG tablet    Sig: Take 1 tablet (2.5 mg total) by mouth daily.    Dispense:  30 tablet    Refill:  3    Fill new script today, discontinue climara pro patch   Diethylpropion HCl CR 75 MG TB24    Sig: Take 1 tablet (75 mg total) by mouth daily.    Dispense:  30 tablet    Refill:  1    Do not run through insurance, will have goodrx coupon    Return in about 8 weeks (around 01/26/2023) for F/U, Weight loss, Landon Bassford PCP, eval new med climara patch and provera tab .   Total time spent:30 Minutes Time spent includes review of chart, medications, test results, and follow up plan with the patient.   [place] Controlled Substance Database was reviewed by me.  This patient was seen by Sallyanne Kuster, FNP-C in collaboration with Dr. Beverely Risen as a part of collaborative care agreement.   Ambriella Kitt R. Tedd Sias, MSN, FNP-C Internal medicine

## 2022-12-02 ENCOUNTER — Encounter: Payer: Self-pay | Admitting: Nurse Practitioner

## 2022-12-10 ENCOUNTER — Encounter: Payer: Self-pay | Admitting: Emergency Medicine

## 2022-12-10 ENCOUNTER — Ambulatory Visit
Admission: EM | Admit: 2022-12-10 | Discharge: 2022-12-10 | Disposition: A | Discharge status: Home / Self Care | Payer: 59

## 2022-12-10 DIAGNOSIS — M7989 Other specified soft tissue disorders: Secondary | ICD-10-CM | POA: Diagnosis not present

## 2022-12-10 DIAGNOSIS — R2232 Localized swelling, mass and lump, left upper limb: Secondary | ICD-10-CM

## 2022-12-10 DIAGNOSIS — R238 Other skin changes: Secondary | ICD-10-CM

## 2022-12-10 DIAGNOSIS — T63481A Toxic effect of venom of other arthropod, accidental (unintentional), initial encounter: Secondary | ICD-10-CM

## 2022-12-10 MED ORDER — METHYLPREDNISOLONE ACETATE 80 MG/ML IJ SUSP
80.0000 mg | Freq: Once | INTRAMUSCULAR | Status: AC
  Administered 2022-12-10: 80 mg via INTRAMUSCULAR

## 2022-12-10 MED ORDER — PREDNISONE 10 MG PO TABS: 10.0000 mg | ORAL_TABLET | Freq: Every day | ORAL | 0 refills | Status: DC

## 2022-12-10 NOTE — Discharge Instructions (Addendum)
You were seen today for swelling, itching and redness of your left forearm and hand.  It appears that you are having a reaction to what ever possibly stung you.  You received a steroid injection.  I am putting you on prednisone daily for the next 3 days.  I encourage ice and elevation.  Please follow-up if your symptoms persist or worsen.

## 2022-12-10 NOTE — ED Triage Notes (Signed)
Patient states that she got bit by something yesterday possibly wasp on her left forearm.  Patient reports itching, redness and swelling in her left wrist and forearm.

## 2022-12-10 NOTE — ED Provider Notes (Signed)
[facility]    CSN: [id number] Arrival date & time: 12/10/22  1306      History   Chief Complaint Chief Complaint  Patient presents with   Insect Bite    Left forearm    HPI Ruth Morales is a 62 y.o. female left forearm and hand swelling, itching and redness.  She reports this occurred yesterday after being stung by a wasp.  She does not think she has ever been stung by wasp.  She reports the swelling and itching worsened overnight.  She denied swelling of the lips, tongue, throat, difficulty breathing or shortness of breath.  She tried taking Zyrtec and applying ice this morning with minimal relief of symptoms.  She denies fever, chills, nausea or vomiting.  HPI  Past Medical History:  Diagnosis Date   Anxiety 11/21/2013   Arthritis    Breath shortness 11/21/2013   Crohn disease (HCC)    Dizziness 11/21/2013   Extensor tenosynovitis of wrist 12/24/2013   Ganglion cyst of wrist 12/24/2013   Hypotension, postural 11/22/2013   Psoriasis    Skin cancer    Thyroid disease     Patient Active Problem List   Diagnosis Date Noted   Pneumonia due to COVID-19 virus 12/04/2019   Acute respiratory failure due to COVID-19 [facility]) 12/04/2019   Other fatigue 03/24/2019   Benign essential HTN 02/28/2018   Screening for breast cancer 12/25/2017   History of ulcerative colitis with history of colectomy 2004 12/25/2017   Screening for malignant neoplasm of cervix 12/25/2017   Ovarian failure 12/25/2017   Psoriasis 12/25/2017   Pelvic pain 08/21/2017   Dysuria 08/02/2017   Urinary tract infection with hematuria 08/02/2017   Menopausal and perimenopausal disorder 05/26/2017   BMI 29.0-29.9,adult 04/14/2017   Candidiasis of vulva and vagina 04/14/2017   Mixed hyperlipidemia 04/14/2017   Syncope and collapse 04/14/2017   Low back pain 04/14/2017   Vitamin D deficiency 04/14/2017   Vitamin B12 deficiency anemia, unspecified 04/14/2017   B12 deficiency 04/14/2017    Vascular headache, not elsewhere classified 04/14/2017   Extensor tenosynovitis of wrist 12/24/2013   Ganglion cyst of wrist 12/24/2013   Hypotension, postural 11/22/2013   Anxiety 11/21/2013   Dizziness 11/21/2013   Breath shortness 11/21/2013    Past Surgical History:  Procedure Laterality Date   BREAST BIOPSY Left 2003   needle bx-neg   BREAST SURGERY Bilateral 2004   breast reduction   CATARACT EXTRACTION W/PHACO Left 02/06/2018   Procedure: CATARACT EXTRACTION PHACO AND INTRAOCULAR LENS PLACEMENT (IOC);  Surgeon: Galen Manila, MD;  Location: [facility] ORS;  Service: Ophthalmology;  Laterality: Left;  [place] 00:31.5 CDE 3.49 Fluid pack Lot # [id number]   CATARACT EXTRACTION W/PHACO Right 11/03/2021   Procedure: CATARACT EXTRACTION PHACO AND INTRAOCULAR LENS PLACEMENT (IOC) RIGHT 5.83 00:35.4;  Surgeon: Galen Manila, MD;  Location: [facility];  Service: Ophthalmology;  Laterality: Right;   COLON SURGERY  2004   colon removed   CTR     EXTRACORPOREAL SHOCK WAVE LITHOTRIPSY Right 02/26/2015   Procedure: EXTRACORPOREAL SHOCK WAVE LITHOTRIPSY (ESWL);  Surgeon: Lorraine Lax, MD;  Location: [facility] OR[facility];  Service: Urology;  Laterality: Right;   REDUCTION MAMMAPLASTY Bilateral 2005    OB History   No obstetric history on file.      Home Medications    Prior to Admission medications   Medication Sig Start Date End Date Taking? Authorizing Provider  celecoxib (CELEBREX) 100 MG capsule Take 100 mg by mouth 2 (  two) times daily.   Yes [provider]  Diethylpropion HCl CR 75 MG TB24 Take 1 tablet (75 mg total) by mouth daily. 12/01/22  Yes Abernathy, Arlyss Repress, NP  estradiol (CLIMARA - DOSED IN MG/24 HR) 0.05 mg/24hr patch Place 1 patch (0.05 mg total) onto the skin once a week. 12/01/22  Yes Abernathy, Arlyss Repress, NP  loperamide (IMODIUM) 2 MG capsule Take 4 mg by mouth 2 (two) times daily.   Yes [provider]  medroxyPROGESTERone (PROVERA) 2.5 MG tablet Take 1  tablet (2.5 mg total) by mouth daily. 12/01/22  Yes Abernathy, Arlyss Repress, NP  predniSONE (DELTASONE) 10 MG tablet Take 1 tablet (10 mg total) by mouth daily with breakfast. 12/10/22  Yes Lorre Munroe, NP  Calcium Citrate-Vitamin D3 315-6.25 MG-MCG TABS Take by mouth.    [provider]  cyclobenzaprine (FLEXERIL) 10 MG tablet Take 1 tablet (10 mg total) by mouth at bedtime. 09/08/21   Sallyanne Kuster, NP  diclofenac (VOLTAREN) 75 MG EC tablet Take 1 tablet (75 mg total) by mouth 2 (two) times daily. 09/17/21   Sallyanne Kuster, NP  ergocalciferol (DRISDOL) 1.25 MG (50000 UT) capsule Take 1 capsule (50,000 Units total) by mouth once a week. 08/23/21   Sallyanne Kuster, NP  ibuprofen (ADVIL,MOTRIN) 200 MG tablet Take 600 mg by mouth every 6 (six) hours as needed for headache or moderate pain.     [provider]  progesterone (PROMETRIUM) 100 MG capsule Take 1 capsule (100 mg total) by mouth daily. 04/21/19 03/01/20  Carlean Jews, NP    Family History Family History  Problem Relation Age of Onset   Diabetes Father    Diabetes Mother    Heart disease Maternal Grandmother    Breast cancer Neg Hx     Social History Social History   Tobacco Use   Smoking status: Former   Smokeless tobacco: Never  Vaping Use   Vaping status: Never Used  Substance Use Topics   Alcohol use: Yes    Alcohol/week: 1.0 standard drink of alcohol    Types: 1 Standard drinks or equivalent per week    Comment: occasional drink, once a month   Drug use: No     Allergies   Morphine, Penicillins, Macrobid [nitrofurantoin macrocrystal], and Benadryl [diphenhydramine hcl]   Review of Systems Review of Systems  Respiratory:  Negative for chest tightness, wheezing and stridor.   Cardiovascular:  Negative for chest pain.  Gastrointestinal:  Negative for nausea and vomiting.  Musculoskeletal:  Positive for joint swelling.  Skin:  Positive for color change.  Neurological:  Negative for  dizziness, weakness and light-headedness.     Physical Exam Triage Vital Signs ED Triage Vitals  Encounter Vitals Group     BP 12/10/22 1328 (!) 154/80     Systolic BP Percentile --      Diastolic BP Percentile --      Pulse Rate 12/10/22 1328 62     Resp 12/10/22 1328 14     Temp 12/10/22 1328 97.7 F (36.5 C)     Temp Source 12/10/22 1328 Oral     SpO2 12/10/22 1328 100 %     Weight 12/10/22 1325 196 lb 10.4 oz (89.2 kg)     Height 12/10/22 1325 5' 8.5" (1.74 m)     Head Circumference --      Peak Flow --      Pain Score 12/10/22 1325 4     Pain Loc --  Pain Education --      Exclude from Growth Chart --    No data found.  Updated Vital Signs BP (!) 154/80 (BP Location: Left Arm)   Pulse 62   Temp 97.7 F (36.5 C) (Oral)   Resp 14   Ht 5' 8.5" (1.74 m)   Wt 196 lb 10.4 oz (89.2 kg)   SpO2 100%   BMI 29.47 kg/m   Visual Acuity Right Eye Distance:   Left Eye Distance:   Bilateral Distance:    Right Eye Near:   Left Eye Near:    Bilateral Near:     Physical Exam Constitutional:      General: She is not in acute distress. Cardiovascular:     Rate and Rhythm: Normal rate and regular rhythm.     Heart sounds: Normal heart sounds.  Pulmonary:     Effort: Pulmonary effort is normal.     Breath sounds: Normal breath sounds. No wheezing, rhonchi or rales.  Musculoskeletal:        General: Swelling present.     Comments: 1+ swelling of the left forearm, and left hand.  Normal flexion, extension rotation of the left elbow.  Normal flexion, extension and rotation of the left wrist.  Handgrips equal.  Skin:    General: Skin is warm and dry.     Findings: Erythema present.     Comments: Redness and warmth noted over the the distal second third and fourth metacarpals however she denies pain with palpation of this area.  No discrete wasp sting or insect bite noted.  Neurological:     General: No focal deficit present.     Mental Status: She is alert and  oriented to person, place, and time.     Cranial Nerves: No cranial nerve deficit.     Sensory: No sensory deficit.      UC Treatments / Results  Labs   EKG   Radiology   Procedures   Medications Ordered in UC Medications  methylPREDNISolone acetate (DEPO-MEDROL) injection 80 mg (has no administration in time range)    Initial Impression / Assessment and Plan / UC Course  I have reviewed the triage vital signs and the nursing notes.  Pertinent labs & imaging results that were available during my care of the patient were reviewed by me and considered in my medical decision making (see chart for details).     62 year old female with swelling of left forearm, left hand, itching, redness and warmth due to presumed insect bite.  No evidence of insect bite.  No history of gout and she denies pain with palpation over the area that seems most swollen, red and warm to touch.  80 mg Depo-Medrol IM x 1, will give Rx prednisone 10 mg daily for the next 3 days.  Encouraged elevation and ice.  Please follow-up if your symptoms persist or worsen.  Final Clinical Impressions(s) / UC Diagnoses   Final diagnoses:  Localized swelling of left forearm  Redness and swelling of hand  Insect stings, accidental or unintentional, initial encounter     Discharge Instructions      You were seen today for swelling, itching and redness of your left forearm and hand.  It appears that you are having a reaction to what ever possibly stung you.  You received a steroid injection.  I am putting you on prednisone daily for the next 3 days.  I encourage ice and elevation.  Please follow-up if your symptoms persist or  worsen.     ED Prescriptions     Medication Sig Dispense Auth. Provider   predniSONE (DELTASONE) 10 MG tablet Take 1 tablet (10 mg total) by mouth daily with breakfast. 3 tablet Antoney Biven, Salvadore Oxford, NP      PDMP not reviewed this encounter.   Lorre Munroe, NP 12/10/22 1341

## 2023-01-26 ENCOUNTER — Ambulatory Visit: Payer: 59 | Admitting: Nurse Practitioner

## 2023-01-26 ENCOUNTER — Encounter: Payer: Self-pay | Admitting: Nurse Practitioner

## 2023-01-26 VITALS — BP 132/80 | HR 66 | Temp 97.4°F | Resp 16 | Ht 68.5 in | Wt 198.2 lb

## 2023-01-26 DIAGNOSIS — Z6831 Body mass index (BMI) 31.0-31.9, adult: Secondary | ICD-10-CM | POA: Diagnosis not present

## 2023-01-26 DIAGNOSIS — E66811 Obesity, class 1: Secondary | ICD-10-CM | POA: Diagnosis not present

## 2023-01-26 DIAGNOSIS — E6609 Other obesity due to excess calories: Secondary | ICD-10-CM | POA: Diagnosis not present

## 2023-01-26 DIAGNOSIS — M810 Age-related osteoporosis without current pathological fracture: Secondary | ICD-10-CM | POA: Diagnosis not present

## 2023-01-26 DIAGNOSIS — N951 Menopausal and female climacteric states: Secondary | ICD-10-CM

## 2023-01-26 DIAGNOSIS — Z1231 Encounter for screening mammogram for malignant neoplasm of breast: Secondary | ICD-10-CM | POA: Diagnosis not present

## 2023-01-26 MED ORDER — TOPIRAMATE 25 MG PO CPSP: 25.0000 mg | ORAL_CAPSULE | Freq: Two times a day (BID) | ORAL | 2 refills | Status: DC

## 2023-01-26 MED ORDER — PHENTERMINE HCL 15 MG PO CAPS: 15.0000 mg | ORAL_CAPSULE | ORAL | 1 refills | Status: DC

## 2023-01-26 NOTE — Progress Notes (Signed)
[facility] [address] [place], [place] [address]  Internal MEDICINE  Office Visit Note  Patient Name: Ruth Morales  [id number]  [id number]  Date of Service: 01/26/2023  Chief Complaint  Patient presents with   Follow-up    Weight loss    HPI Ruth Morales presents for a follow-up visit for HRT, weight loss, mammogram Estradiol and medroxyprogesterone is being tolerated, no issues. Taking this mostly to prevent bone loss. Also taking calcium with vitamin D  Weight loss -- medication is no longer helping with appetite suppression. Wants to try something different.  Due for routine mammogram  The 10-year ASCVD risk score (Arnett DK, et al., 2019) is: 3.9%   Values used to calculate the score:     Age: 62 years     Sex: Female     Is Non-Hispanic African American: No     Diabetic: No     Tobacco smoker: No     Systolic Blood Pressure: 132 mmHg     Is BP treated: No     HDL Cholesterol: 95 mg/dL     Total Cholesterol: 230 mg/dL  Father had MI  Maternal grandmother died from heart failure in her 42s, had already had CABG,  Has significant family history of diabetes and ischemic heart disease.   Current Medication: Outpatient Encounter Medications as of 01/26/2023  Medication Sig   Calcium Citrate-Vitamin D3 315-6.25 MG-MCG TABS Take by mouth.   celecoxib (CELEBREX) 100 MG capsule Take 100 mg by mouth 2 (two) times daily.   cyclobenzaprine (FLEXERIL) 10 MG tablet Take 1 tablet (10 mg total) by mouth at bedtime.   diclofenac (VOLTAREN) 75 MG EC tablet Take 1 tablet (75 mg total) by mouth 2 (two) times daily.   ergocalciferol (DRISDOL) 1.25 MG (50000 UT) capsule Take 1 capsule (50,000 Units total) by mouth once a week.   estradiol (CLIMARA - DOSED IN MG/24 HR) 0.05 mg/24hr patch Place 1 patch (0.05 mg total) onto the skin once a week.   ibuprofen (ADVIL,MOTRIN) 200 MG tablet Take 600 mg by mouth every 6 (six) hours as needed for headache or moderate pain.    loperamide  (IMODIUM) 2 MG capsule Take 4 mg by mouth 2 (two) times daily.   medroxyPROGESTERone (PROVERA) 2.5 MG tablet Take 1 tablet (2.5 mg total) by mouth daily.   phentermine 15 MG capsule Take 1 capsule (15 mg total) by mouth every morning.   predniSONE (DELTASONE) 10 MG tablet Take 1 tablet (10 mg total) by mouth daily with breakfast.   topiramate (TOPAMAX) 25 MG capsule Take 1 capsule (25 mg total) by mouth 2 (two) times daily. Start once daily, may increase to twice daily   [DISCONTINUED] Diethylpropion HCl CR 75 MG TB24 Take 1 tablet (75 mg total) by mouth daily.   [DISCONTINUED] progesterone (PROMETRIUM) 100 MG capsule Take 1 capsule (100 mg total) by mouth daily.   No facility-administered encounter medications on file as of 01/26/2023.    Surgical History: Past Surgical History:  Procedure Laterality Date   BREAST BIOPSY Left 2003   needle bx-neg   BREAST SURGERY Bilateral 2004   breast reduction   CATARACT EXTRACTION W/PHACO Left 02/06/2018   Procedure: CATARACT EXTRACTION PHACO AND INTRAOCULAR LENS PLACEMENT (IOC);  Surgeon: Galen Manila, MD;  Location: [facility];  Service: Ophthalmology;  Laterality: Left;  [place] 00:31.5 CDE 3.49 Fluid pack Lot # [id number]   CATARACT EXTRACTION W/PHACO Right 11/03/2021   Procedure: CATARACT EXTRACTION PHACO AND INTRAOCULAR LENS PLACEMENT (  IOC) RIGHT 5.83 00:35.4;  Surgeon: Galen Manila, MD;  Location: [facility];  Service: Ophthalmology;  Laterality: Right;   COLON SURGERY  2004   colon removed   CTR     EXTRACORPOREAL SHOCK WAVE LITHOTRIPSY Right 02/26/2015   Procedure: EXTRACORPOREAL SHOCK WAVE LITHOTRIPSY (ESWL);  Surgeon: Lorraine Lax, MD;  Location: [facility];  Service: Urology;  Laterality: Right;   REDUCTION MAMMAPLASTY Bilateral 2005    Medical History: Past Medical History:  Diagnosis Date   Anxiety 11/21/2013   Arthritis    Breath shortness 11/21/2013   Crohn disease (HCC)    Dizziness 11/21/2013   Extensor  tenosynovitis of wrist 12/24/2013   Ganglion cyst of wrist 12/24/2013   Hypotension, postural 11/22/2013   Psoriasis    Skin cancer    Thyroid disease     Family History: Family History  Problem Relation Age of Onset   Diabetes Father    Diabetes Mother    Heart disease Maternal Grandmother    Breast cancer Neg Hx     Social History   Socioeconomic History   Marital status: Married    Spouse name: Not on file   Number of children: Not on file   Years of education: Not on file   Highest education level: Not on file  Occupational History   Not on file  Tobacco Use   Smoking status: Former   Smokeless tobacco: Never  Vaping Use   Vaping status: Never Used  Substance and Sexual Activity   Alcohol use: Yes    Alcohol/week: 1.0 standard drink of alcohol    Types: 1 Standard drinks or equivalent per week    Comment: occasional drink, once a month   Drug use: No   Sexual activity: Yes    Partners: Male  Other Topics Concern   Not on file  Social History Narrative   Not on file   Social Determinants of Health   Financial Resource Strain: Not on file  Food Insecurity: Not on file  Transportation Needs: Not on file  Physical Activity: Not on file  Stress: Not on file  Social Connections: Not on file  Intimate Partner Violence: Not on file      Review of Systems  Constitutional:  Positive for appetite change and unexpected weight change. Negative for chills and fatigue.  HENT:  Negative for congestion, rhinorrhea, sneezing and sore throat.   Eyes:  Negative for redness.  Respiratory:  Negative for cough, chest tightness, shortness of breath and wheezing.   Cardiovascular: Negative.  Negative for chest pain and palpitations.  Gastrointestinal:  Negative for abdominal pain, constipation, diarrhea, nausea and vomiting.  Genitourinary:  Negative for dysuria and frequency.  Musculoskeletal:  Positive for arthralgias, back pain and neck pain. Negative for joint  swelling.  Skin:  Negative for rash.  Neurological: Negative.  Negative for tremors and numbness.  Hematological:  Negative for adenopathy. Does not bruise/bleed easily.  Psychiatric/Behavioral:  Negative for behavioral problems (Depression), sleep disturbance and suicidal ideas. The patient is not nervous/anxious.     Vital Signs: BP 132/80   Pulse 66   Temp (!) 97.4 F (36.3 C)   Resp 16   Ht 5' 8.5" (1.74 m)   Wt 198 lb 3.2 oz (89.9 kg)   SpO2 99%   BMI 29.70 kg/m    Physical Exam Vitals reviewed.  Constitutional:      General: She is not in acute distress.    Appearance: Normal appearance. She is obese.  She is not ill-appearing.  HENT:     Head: Normocephalic and atraumatic.  Eyes:     Pupils: Pupils are equal, round, and reactive to light.  Cardiovascular:     Rate and Rhythm: Normal rate and regular rhythm.  Pulmonary:     Effort: Pulmonary effort is normal. No respiratory distress.  Neurological:     Mental Status: She is alert and oriented to person, place, and time.  Psychiatric:        Mood and Affect: Mood normal.        Behavior: Behavior normal.        Assessment/Plan: 1. Postmenopausal bone loss Continues estradiol and medroxyprogesterone as prescribed.   2. Vasomotor symptoms due to menopause Continues estradiol and medroxyprogesterone as prescribed.   3. Class 1 obesity due to excess calories with serious comorbidity and body mass index (BMI) of 31.0 to 31.9 in adult Switched to phentermine and topiramate, take as prescribed, follow up in 8 weeks  4. Encounter for screening mammogram for malignant neoplasm of breast Routine mammogram ordered  - MM 3D SCREENING MAMMOGRAM BILATERAL BREAST; Future   General Counseling: Oneisha verbalizes understanding of the findings of todays visit and agrees with plan of treatment. I have discussed any further diagnostic evaluation that may be needed or ordered today. We also reviewed her medications today. she  has been encouraged to call the office with any questions or concerns that should arise related to todays visit.    No orders of the defined types were placed in this encounter.   Meds ordered this encounter  Medications   phentermine 15 MG capsule    Sig: Take 1 capsule (15 mg total) by mouth every morning.    Dispense:  30 capsule    Refill:  1    Do not run through insurance patient paying out of pocket   topiramate (TOPAMAX) 25 MG capsule    Sig: Take 1 capsule (25 mg total) by mouth 2 (two) times daily. Start once daily, may increase to twice daily    Dispense:  180 capsule    Refill:  2    Fill new script.    Return in about 8 weeks (around 03/23/2023) for F/U, Weight loss, Zaiya Annunziato PCP, eval new med.   Total time spent:30 Minutes Time spent includes review of chart, medications, test results, and follow up plan with the patient.   [place] Controlled Substance Database was reviewed by me.  This patient was seen by Sallyanne Kuster, FNP-C in collaboration with Dr. Beverely Risen as a part of collaborative care agreement.   Guy Seese R. Tedd Sias, MSN, FNP-C Internal medicine

## 2023-01-27 ENCOUNTER — Encounter: Payer: Self-pay | Admitting: Nurse Practitioner

## 2023-02-07 ENCOUNTER — Ambulatory Visit: Payer: 59 | Admitting: Nurse Practitioner

## 2023-02-07 ENCOUNTER — Encounter: Payer: Self-pay | Admitting: Nurse Practitioner

## 2023-02-07 VITALS — BP 129/78 | HR 68 | Temp 97.9°F | Resp 16 | Ht 68.5 in | Wt 198.4 lb

## 2023-02-07 DIAGNOSIS — N3001 Acute cystitis with hematuria: Secondary | ICD-10-CM

## 2023-02-07 DIAGNOSIS — R300 Dysuria: Secondary | ICD-10-CM | POA: Diagnosis not present

## 2023-02-07 LAB — POCT URINALYSIS DIPSTICK
Bilirubin, UA: NEGATIVE
Glucose, UA: NEGATIVE
Leukocytes, UA: NEGATIVE
Nitrite, UA: NEGATIVE
Protein, UA: POSITIVE — AB
Spec Grav, UA: 1.015 (ref 1.010–1.025)
Urobilinogen, UA: 0.2 U/dL
pH, UA: 6 (ref 5.0–8.0)

## 2023-02-07 MED ORDER — SULFAMETHOXAZOLE-TRIMETHOPRIM 800-160 MG PO TABS
1.0000 | ORAL_TABLET | Freq: Two times a day (BID) | ORAL | 0 refills | Status: AC
Start: 2023-02-07 — End: 2023-02-12

## 2023-02-07 NOTE — Progress Notes (Signed)
[facility] [address] [place], [place] [address]  Internal MEDICINE  Office Visit Note  Patient Name: Ruth Morales  [id number]  [id number]  Date of Service: 02/07/2023  Chief Complaint  Patient presents with   Acute Visit    uti     HPI Ruth Morales presents for an acute sick visit for symptoms of UTI  --onset was about 4 days ago.  --reports frequency, urgency, burning with urination, suprapubic tenderness and pain,  --has not tried anything at home.  --urinalysis showed trace blood    Current Medication:  Outpatient Encounter Medications as of 02/07/2023  Medication Sig   Calcium Citrate-Vitamin D3 315-6.25 MG-MCG TABS Take by mouth.   celecoxib (CELEBREX) 100 MG capsule Take 100 mg by mouth 2 (two) times daily.   cyclobenzaprine (FLEXERIL) 10 MG tablet Take 1 tablet (10 mg total) by mouth at bedtime.   diclofenac (VOLTAREN) 75 MG EC tablet Take 1 tablet (75 mg total) by mouth 2 (two) times daily.   ergocalciferol (DRISDOL) 1.25 MG (50000 UT) capsule Take 1 capsule (50,000 Units total) by mouth once a week.   estradiol (CLIMARA - DOSED IN MG/24 HR) 0.05 mg/24hr patch Place 1 patch (0.05 mg total) onto the skin once a week.   ibuprofen (ADVIL,MOTRIN) 200 MG tablet Take 600 mg by mouth every 6 (six) hours as needed for headache or moderate pain.    loperamide (IMODIUM) 2 MG capsule Take 4 mg by mouth 2 (two) times daily.   medroxyPROGESTERone (PROVERA) 2.5 MG tablet Take 1 tablet (2.5 mg total) by mouth daily.   phentermine 15 MG capsule Take 1 capsule (15 mg total) by mouth every morning.   predniSONE (DELTASONE) 10 MG tablet Take 1 tablet (10 mg total) by mouth daily with breakfast.   sulfamethoxazole-trimethoprim (BACTRIM DS) 800-160 MG tablet Take 1 tablet by mouth 2 (two) times daily for 5 days. Take with food   topiramate (TOPAMAX) 25 MG capsule Take 1 capsule (25 mg total) by mouth 2 (two) times daily. Start once daily, may increase to twice daily    [DISCONTINUED] progesterone (PROMETRIUM) 100 MG capsule Take 1 capsule (100 mg total) by mouth daily.   No facility-administered encounter medications on file as of 02/07/2023.      Medical History: Past Medical History:  Diagnosis Date   Anxiety 11/21/2013   Arthritis    Breath shortness 11/21/2013   Crohn disease (HCC)    Dizziness 11/21/2013   Extensor tenosynovitis of wrist 12/24/2013   Ganglion cyst of wrist 12/24/2013   Hypotension, postural 11/22/2013   Psoriasis    Skin cancer    Thyroid disease      Vital Signs: BP 129/78 Comment: 146/90  Pulse 68   Temp 97.9 F (36.6 C)   Resp 16   Ht 5' 8.5" (1.74 m)   Wt 198 lb 6.4 oz (90 kg)   SpO2 96%   BMI 29.73 kg/m    Review of Systems  Constitutional:  Negative for fatigue.  HENT: Negative.    Respiratory: Negative.  Negative for cough, chest tightness, shortness of breath and wheezing.   Cardiovascular: Negative.  Negative for chest pain and palpitations.  Gastrointestinal: Negative.   Genitourinary:  Positive for difficulty urinating, dysuria, flank pain, frequency, hematuria, pelvic pain and urgency.  Hematological: Negative.   Psychiatric/Behavioral: Negative.      Physical Exam Vitals reviewed.  Constitutional:      General: She is not in acute distress.    Appearance: Normal  appearance. She is obese. She is not ill-appearing.  HENT:     Head: Normocephalic and atraumatic.  Eyes:     Pupils: Pupils are equal, round, and reactive to light.  Cardiovascular:     Rate and Rhythm: Normal rate and regular rhythm.  Pulmonary:     Effort: Pulmonary effort is normal. No respiratory distress.  Neurological:     Mental Status: She is alert and oriented to person, place, and time.  Psychiatric:        Mood and Affect: Mood normal.        Behavior: Behavior normal.       Assessment/Plan: 1. Acute cystitis with hematuria Urine culture sent, bactrim prescribed, take until gone.  - CULTURE, URINE  COMPREHENSIVE - sulfamethoxazole-trimethoprim (BACTRIM DS) 800-160 MG tablet; Take 1 tablet by mouth 2 (two) times daily for 5 days. Take with food  Dispense: 10 tablet; Refill: 0  2. Dysuria Urinalysis done, trace blood noted, urine culture sent - POCT urinalysis dipstick    General Counseling: Ruth Morales verbalizes understanding of the findings of todays visit and agrees with plan of treatment. I have discussed any further diagnostic evaluation that may be needed or ordered today. We also reviewed her medications today. she has been encouraged to call the office with any questions or concerns that should arise related to todays visit.    Counseling:    Orders Placed This Encounter  Procedures   CULTURE, URINE COMPREHENSIVE   POCT urinalysis dipstick    Meds ordered this encounter  Medications   sulfamethoxazole-trimethoprim (BACTRIM DS) 800-160 MG tablet    Sig: Take 1 tablet by mouth 2 (two) times daily for 5 days. Take with food    Dispense:  10 tablet    Refill:  0    Fill new script today    Return if symptoms worsen or fail to improve.  [place] Controlled Substance Database was reviewed by me for overdose risk score (ORS)  Time spent:30 Minutes Time spent with patient included reviewing progress notes, labs, imaging studies, and discussing plan for follow up.   This patient was seen by Ruth Kuster, FNP-C in collaboration with Dr. Beverely Risen as a part of collaborative care agreement.  Ruth Morales R. Tedd Sias, MSN, FNP-C Internal Medicine

## 2023-02-10 LAB — CULTURE, URINE COMPREHENSIVE

## 2023-02-13 ENCOUNTER — Other Ambulatory Visit: Payer: Self-pay

## 2023-02-13 MED ORDER — FLUCONAZOLE 150 MG PO TABS: ORAL_TABLET | ORAL | 0 refills | Status: DC

## 2023-02-13 NOTE — Telephone Encounter (Signed)
Pt called that she finished antibiotic and she is not filling better as per alyssa advised urine culture is normal we sent diflucan and advised to drink plenty of water

## 2023-02-15 ENCOUNTER — Ambulatory Visit
Admission: RE | Admit: 2023-02-15 | Discharge: 2023-02-15 | Disposition: A | Discharge status: Home / Self Care | Payer: 59 | Source: Ambulatory Visit | Attending: Nurse Practitioner | Admitting: Nurse Practitioner

## 2023-02-15 DIAGNOSIS — Z1231 Encounter for screening mammogram for malignant neoplasm of breast: Secondary | ICD-10-CM | POA: Insufficient documentation

## 2023-02-21 ENCOUNTER — Inpatient Hospital Stay
Admission: RE | Admit: 2023-02-21 | Discharge: 2023-02-21 | Disposition: A | Discharge status: Home / Self Care | Payer: Self-pay | Source: Ambulatory Visit | Attending: Internal Medicine | Admitting: Internal Medicine

## 2023-02-21 ENCOUNTER — Other Ambulatory Visit: Payer: Self-pay | Admitting: *Deleted

## 2023-02-21 DIAGNOSIS — Z1231 Encounter for screening mammogram for malignant neoplasm of breast: Secondary | ICD-10-CM

## 2023-02-22 ENCOUNTER — Encounter: Payer: Self-pay | Admitting: Internal Medicine

## 2023-02-22 ENCOUNTER — Ambulatory Visit: Payer: 59 | Admitting: Internal Medicine

## 2023-02-22 VITALS — BP 146/90 | HR 82 | Temp 98.2°F | Resp 16 | Ht 68.5 in | Wt 197.8 lb

## 2023-02-22 DIAGNOSIS — M159 Polyosteoarthritis, unspecified: Secondary | ICD-10-CM | POA: Diagnosis not present

## 2023-02-22 DIAGNOSIS — K219 Gastro-esophageal reflux disease without esophagitis: Secondary | ICD-10-CM

## 2023-02-22 DIAGNOSIS — R102 Pelvic and perineal pain: Secondary | ICD-10-CM | POA: Diagnosis not present

## 2023-02-22 DIAGNOSIS — Z124 Encounter for screening for malignant neoplasm of cervix: Secondary | ICD-10-CM | POA: Diagnosis not present

## 2023-02-22 DIAGNOSIS — Z113 Encounter for screening for infections with a predominantly sexual mode of transmission: Secondary | ICD-10-CM | POA: Diagnosis not present

## 2023-02-22 DIAGNOSIS — R1084 Generalized abdominal pain: Secondary | ICD-10-CM | POA: Diagnosis not present

## 2023-02-22 NOTE — Progress Notes (Signed)
[facility] [address] [place], [place] [address]  Internal MEDICINE  Office Visit Note  Patient Name: Ruth Morales  [id number]  [id number]  Date of Service: 02/22/2023  Chief Complaint  Patient presents with   Acute Visit    Requesting pap, issues and pain    HPI Pt is seen for acute visit Pt has been treated for possible UTI about 10 days ago, she also took diflucan but symptoms are not improved She thinks its getting worse, has hear burn, abdominal and pelvic pain, she does not feel well  She is not sexually active, denies any discharge or blood. She is on HRT and has intact uterus  She is also c/o aches and pains and new rash on her arms, knee hurt all the time, takes celebrex  C/o heartburn chest pain? Goes across her chest, no radiation     Current Medication: Outpatient Encounter Medications as of 02/22/2023  Medication Sig   Calcium Citrate-Vitamin D3 315-6.25 MG-MCG TABS Take by mouth.   celecoxib (CELEBREX) 100 MG capsule Take 100 mg by mouth 2 (two) times daily.   cyclobenzaprine (FLEXERIL) 10 MG tablet Take 1 tablet (10 mg total) by mouth at bedtime.   ergocalciferol (DRISDOL) 1.25 MG (50000 UT) capsule Take 1 capsule (50,000 Units total) by mouth once a week.   estradiol (CLIMARA - DOSED IN MG/24 HR) 0.05 mg/24hr patch Place 1 patch (0.05 mg total) onto the skin once a week.   ibuprofen (ADVIL,MOTRIN) 200 MG tablet Take 600 mg by mouth every 6 (six) hours as needed for headache or moderate pain.    loperamide (IMODIUM) 2 MG capsule Take 4 mg by mouth 2 (two) times daily.   medroxyPROGESTERone (PROVERA) 2.5 MG tablet Take 1 tablet (2.5 mg total) by mouth daily.   phentermine 15 MG capsule Take 1 capsule (15 mg total) by mouth every morning.   topiramate (TOPAMAX) 25 MG capsule Take 1 capsule (25 mg total) by mouth 2 (two) times daily. Start once daily, may increase to twice daily   [DISCONTINUED] diclofenac (VOLTAREN) 75 MG EC tablet Take 1 tablet (75  mg total) by mouth 2 (two) times daily.   [DISCONTINUED] fluconazole (DIFLUCAN) 150 MG tablet Take 1 tab po once and  may repeat in three days if symptoms persist   [DISCONTINUED] predniSONE (DELTASONE) 10 MG tablet Take 1 tablet (10 mg total) by mouth daily with breakfast.   [DISCONTINUED] progesterone (PROMETRIUM) 100 MG capsule Take 1 capsule (100 mg total) by mouth daily.   No facility-administered encounter medications on file as of 02/22/2023.    Surgical History: Past Surgical History:  Procedure Laterality Date   BREAST BIOPSY Left 2003   needle bx-neg   BREAST SURGERY Bilateral 2004   breast reduction   CATARACT EXTRACTION W/PHACO Left 02/06/2018   Procedure: CATARACT EXTRACTION PHACO AND INTRAOCULAR LENS PLACEMENT (IOC);  Surgeon: Galen Manila, MD;  Location: [facility];  Service: Ophthalmology;  Laterality: Left;  [place] 00:31.5 CDE 3.49 Fluid pack Lot # [id number]   CATARACT EXTRACTION W/PHACO Right 11/03/2021   Procedure: CATARACT EXTRACTION PHACO AND INTRAOCULAR LENS PLACEMENT (IOC) RIGHT 5.83 00:35.4;  Surgeon: Galen Manila, MD;  Location: [facility];  Service: Ophthalmology;  Laterality: Right;   COLON SURGERY  2004   colon removed   CTR     EXTRACORPOREAL SHOCK WAVE LITHOTRIPSY Right 02/26/2015   Procedure: EXTRACORPOREAL SHOCK WAVE LITHOTRIPSY (ESWL);  Surgeon: Lorraine Lax, MD;  Location: [facility];  Service: Urology;  Laterality: Right;   REDUCTION MAMMAPLASTY Bilateral 2005    Medical History: Past Medical History:  Diagnosis Date   Anxiety 11/21/2013   Arthritis    Breath shortness 11/21/2013   Crohn disease (HCC)    Dizziness 11/21/2013   Extensor tenosynovitis of wrist 12/24/2013   Ganglion cyst of wrist 12/24/2013   Hypotension, postural 11/22/2013   Psoriasis    Skin cancer    Thyroid disease     Family History: Family History  Problem Relation Age of Onset   Diabetes Father    Diabetes Mother    Heart disease Maternal Grandmother     Breast cancer Neg Hx     Social History   Socioeconomic History   Marital status: Married    Spouse name: Not on file   Number of children: Not on file   Years of education: Not on file   Highest education level: Not on file  Occupational History   Not on file  Tobacco Use   Smoking status: Former   Smokeless tobacco: Never  Vaping Use   Vaping status: Never Used  Substance and Sexual Activity   Alcohol use: Yes    Alcohol/week: 1.0 standard drink of alcohol    Types: 1 Standard drinks or equivalent per week    Comment: occasional drink, once a month   Drug use: No   Sexual activity: Yes    Partners: Male  Other Topics Concern   Not on file  Social History Narrative   Not on file   Social Determinants of Health   Financial Resource Strain: Not on file  Food Insecurity: Not on file  Transportation Needs: Not on file  Physical Activity: Not on file  Stress: Not on file  Social Connections: Not on file  Intimate Partner Violence: Not on file      Review of Systems  Constitutional:  Negative for fatigue and fever.  HENT:  Negative for congestion, mouth sores and postnasal drip.   Respiratory:  Negative for cough.   Cardiovascular:  Negative for chest pain.  Genitourinary:  Positive for dysuria, flank pain and urgency.  Musculoskeletal:  Positive for arthralgias, back pain and joint swelling.  Skin:  Positive for rash.  Psychiatric/Behavioral: Negative.      Vital Signs: BP (!) 146/90   Pulse 82   Temp 98.2 F (36.8 C)   Resp 16   Ht 5' 8.5" (1.74 m)   Wt 197 lb 12.8 oz (89.7 kg)   SpO2 97%   BMI 29.64 kg/m    Physical Exam Constitutional:      Appearance: Normal appearance.  HENT:     Head: Normocephalic and atraumatic.     Nose: Nose normal.     Mouth/Throat:     Mouth: Mucous membranes are moist.     Pharynx: No posterior oropharyngeal erythema.  Eyes:     Extraocular Movements: Extraocular movements intact.     Pupils: Pupils are  equal, round, and reactive to light.  Cardiovascular:     Pulses: Normal pulses.     Heart sounds: Normal heart sounds.  Pulmonary:     Effort: Pulmonary effort is normal.     Breath sounds: Normal breath sounds.  Genitourinary:    General: Normal vulva.     Vagina: No vaginal discharge.  Musculoskeletal:        General: Tenderness present.  Neurological:     General: No focal deficit present.     Mental Status: She is alert.  Psychiatric:  Mood and Affect: Mood normal.        Behavior: Behavior normal.        Assessment/Plan: 1. Generalized abdominal pain Will get further diagnostics, avoid any medications  - US Abdomen Complete; Future  2. Pelvic pain Continue HRT until u/s results are available  - US PELVIS (TRANSABDOMINAL ONLY); Future  3. Gastroesophageal reflux disease without esophagitis Pt has been on PPI in the past, instructed to get pepcid otc for now  - DG UGI W DOUBLE CM (HD BA); Future  4. Generalized osteoarthritis Needs further diagnostics  - Sed Rate (ESR) - CYCLIC CITRUL PEPTIDE ANTIBODY, IGG/IGA - ANA Direct w/Reflex if Positive  5. Pap smear for cervical cancer screening No abnormal discharge or abnormality noticed  - IGP, Aptima HPV  6. Screening for STDs (sexually transmitted diseases) - NuSwab Vaginitis Plus (VG+)   General Counseling: Sherina verbalizes understanding of the findings of todays visit and agrees with plan of treatment. I have discussed any further diagnostic evaluation that may be needed or ordered today. We also reviewed her medications today. she has been encouraged to call the office with any questions or concerns that should arise related to todays visit.    Orders Placed This Encounter  Procedures   US Abdomen Complete   US PELVIS (TRANSABDOMINAL ONLY)   DG UGI W DOUBLE CM (HD BA)   Sed Rate (ESR)   CYCLIC CITRUL PEPTIDE ANTIBODY, IGG/IGA   ANA Direct w/Reflex if Positive   NuSwab Vaginitis Plus (VG+)    No  orders of the defined types were placed in this encounter.   Total time spent:45 Minutes Time spent includes review of chart, medications, test results, and follow up plan with the patient.   [place] Controlled Substance Database was reviewed by me.   Dr Lyndon Code Internal medicine

## 2023-02-24 LAB — ANA W/REFLEX IF POSITIVE: Anti Nuclear Antibody (ANA): NEGATIVE

## 2023-02-24 LAB — SEDIMENTATION RATE: Sed Rate: 7 mm/h (ref 0–40)

## 2023-02-24 LAB — CYCLIC CITRUL PEPTIDE ANTIBODY, IGG/IGA: Cyclic Citrullin Peptide Ab: 4 U (ref 0–19)

## 2023-02-26 ENCOUNTER — Ambulatory Visit
Admission: EM | Admit: 2023-02-26 | Discharge: 2023-02-26 | Disposition: A | Discharge status: Home / Self Care | Payer: 59 | Attending: Emergency Medicine | Admitting: Emergency Medicine

## 2023-02-26 ENCOUNTER — Ambulatory Visit: Payer: 59

## 2023-02-26 DIAGNOSIS — J069 Acute upper respiratory infection, unspecified: Secondary | ICD-10-CM | POA: Insufficient documentation

## 2023-02-26 DIAGNOSIS — H109 Unspecified conjunctivitis: Secondary | ICD-10-CM | POA: Diagnosis not present

## 2023-02-26 DIAGNOSIS — R058 Other specified cough: Secondary | ICD-10-CM | POA: Diagnosis not present

## 2023-02-26 LAB — NUSWAB VAGINITIS PLUS (VG+)
Candida albicans, NAA: NEGATIVE
Candida glabrata, NAA: NEGATIVE
Chlamydia trachomatis, NAA: NEGATIVE
Neisseria gonorrhoeae, NAA: NEGATIVE
Trich vag by NAA: NEGATIVE

## 2023-02-26 LAB — GROUP A STREP BY PCR: Group A Strep by PCR: NOT DETECTED

## 2023-02-26 MED ORDER — PROMETHAZINE-DM 6.25-15 MG/5ML PO SYRP: 5.0000 mL | ORAL_SOLUTION | Freq: Four times a day (QID) | ORAL | 0 refills | Status: DC | PRN

## 2023-02-26 MED ORDER — MOXIFLOXACIN HCL 0.5 % OP SOLN: 1.0000 [drp] | Freq: Three times a day (TID) | OPHTHALMIC | 0 refills | Status: AC

## 2023-02-26 MED ORDER — IPRATROPIUM BROMIDE 0.06 % NA SOLN: 2.0000 | Freq: Four times a day (QID) | NASAL | 12 refills | Status: DC

## 2023-02-26 MED ORDER — BENZONATATE 100 MG PO CAPS: 200.0000 mg | ORAL_CAPSULE | Freq: Three times a day (TID) | ORAL | 0 refills | Status: DC

## 2023-02-26 NOTE — ED Provider Notes (Signed)
[facility]    CSN: [id number] Arrival date & time: 02/26/23  0803      History   Chief Complaint Chief Complaint  Patient presents with   Conjunctivitis   Sore Throat   Cough    HPI Ruth Morales is a 62 y.o. female.   HPI  62 year old female with a past medical history significant for thyroid disease, skin cancer, psoriasis, Crohn's disease, and anxiety presents for evaluation of 1 week worth of respiratory symptoms.  Her symptoms began with a cough that is productive for green sputum but no shortness of breath or wheezing.  She has experienced nasal congestion but denies runny nose or fever.  Yesterday she developed a sore throat and she reports that she has been waking up with a crusty right eye for the last 3 days.  She describes the eyes being tender but no itching.  Past Medical History:  Diagnosis Date   Anxiety 11/21/2013   Arthritis    Breath shortness 11/21/2013   Crohn disease (HCC)    Dizziness 11/21/2013   Extensor tenosynovitis of wrist 12/24/2013   Ganglion cyst of wrist 12/24/2013   Hypotension, postural 11/22/2013   Psoriasis    Skin cancer    Thyroid disease     Patient Active Problem List   Diagnosis Date Noted   Pneumonia due to COVID-19 virus 12/04/2019   Acute respiratory failure due to COVID-19 [facility]) 12/04/2019   Other fatigue 03/24/2019   Benign essential HTN 02/28/2018   Screening for breast cancer 12/25/2017   History of ulcerative colitis with history of colectomy 2004 12/25/2017   Screening for malignant neoplasm of cervix 12/25/2017   Ovarian failure 12/25/2017   Psoriasis 12/25/2017   Pelvic pain 08/21/2017   Dysuria 08/02/2017   Urinary tract infection with hematuria 08/02/2017   Menopausal and perimenopausal disorder 05/26/2017   BMI 29.0-29.9,adult 04/14/2017   Candidiasis of vulva and vagina 04/14/2017   Mixed hyperlipidemia 04/14/2017   Syncope and collapse 04/14/2017   Low back pain 04/14/2017   Vitamin D  deficiency 04/14/2017   Vitamin B12 deficiency anemia, unspecified 04/14/2017   B12 deficiency 04/14/2017   Vascular headache, not elsewhere classified 04/14/2017   Extensor tenosynovitis of wrist 12/24/2013   Ganglion cyst of wrist 12/24/2013   Hypotension, postural 11/22/2013   Anxiety 11/21/2013   Dizziness 11/21/2013   Breath shortness 11/21/2013    Past Surgical History:  Procedure Laterality Date   BREAST BIOPSY Left 2003   needle bx-neg   BREAST SURGERY Bilateral 2004   breast reduction   CATARACT EXTRACTION W/PHACO Left 02/06/2018   Procedure: CATARACT EXTRACTION PHACO AND INTRAOCULAR LENS PLACEMENT (IOC);  Surgeon: Galen Manila, MD;  Location: [facility];  Service: Ophthalmology;  Laterality: Left;  [place] 00:31.5 CDE 3.49 Fluid pack Lot # [id number]   CATARACT EXTRACTION W/PHACO Right 11/03/2021   Procedure: CATARACT EXTRACTION PHACO AND INTRAOCULAR LENS PLACEMENT (IOC) RIGHT 5.83 00:35.4;  Surgeon: Galen Manila, MD;  Location: [facility];  Service: Ophthalmology;  Laterality: Right;   COLON SURGERY  2004   colon removed   CTR     EXTRACORPOREAL SHOCK WAVE LITHOTRIPSY Right 02/26/2015   Procedure: EXTRACORPOREAL SHOCK WAVE LITHOTRIPSY (ESWL);  Surgeon: Lorraine Lax, MD;  Location: [facility];  Service: Urology;  Laterality: Right;   REDUCTION MAMMAPLASTY Bilateral 2005    OB History   No obstetric history on file.      Home Medications    Prior to Admission medications   Medication  Sig Start Date End Date Taking? Authorizing Provider  benzonatate (TESSALON) 100 MG capsule Take 2 capsules (200 mg total) by mouth every 8 (eight) hours. 02/26/23  Yes Becky Augusta, NP  Calcium Citrate-Vitamin D3 315-6.25 MG-MCG TABS Take by mouth.   Yes [provider]  ipratropium (ATROVENT) 0.06 % nasal spray Place 2 sprays into both nostrils 4 (four) times daily. 02/26/23  Yes Becky Augusta, NP  loperamide (IMODIUM) 2 MG capsule Take 4 mg by mouth 2 (two) times  daily.   Yes [provider]  medroxyPROGESTERone (PROVERA) 2.5 MG tablet Take 1 tablet (2.5 mg total) by mouth daily. 12/01/22  Yes Abernathy, Arlyss Repress, NP  moxifloxacin (VIGAMOX) 0.5 % ophthalmic solution Place 1 drop into both eyes 3 (three) times daily for 7 days. 02/26/23 03/05/23 Yes Becky Augusta, NP  promethazine-dextromethorphan (PROMETHAZINE-DM) 6.25-15 MG/5ML syrup Take 5 mLs by mouth 4 (four) times daily as needed. 02/26/23  Yes Becky Augusta, NP  celecoxib (CELEBREX) 100 MG capsule Take 100 mg by mouth 2 (two) times daily.    [provider]  cyclobenzaprine (FLEXERIL) 10 MG tablet Take 1 tablet (10 mg total) by mouth at bedtime. 09/08/21   Sallyanne Kuster, NP  ergocalciferol (DRISDOL) 1.25 MG (50000 UT) capsule Take 1 capsule (50,000 Units total) by mouth once a week. 08/23/21   Sallyanne Kuster, NP  estradiol (CLIMARA - DOSED IN MG/24 HR) 0.05 mg/24hr patch Place 1 patch (0.05 mg total) onto the skin once a week. 12/01/22   Sallyanne Kuster, NP  ibuprofen (ADVIL,MOTRIN) 200 MG tablet Take 600 mg by mouth every 6 (six) hours as needed for headache or moderate pain.     [provider]  phentermine 15 MG capsule Take 1 capsule (15 mg total) by mouth every morning. 01/26/23   Sallyanne Kuster, NP  topiramate (TOPAMAX) 25 MG capsule Take 1 capsule (25 mg total) by mouth 2 (two) times daily. Start once daily, may increase to twice daily 01/26/23   Sallyanne Kuster, NP  progesterone (PROMETRIUM) 100 MG capsule Take 1 capsule (100 mg total) by mouth daily. 04/21/19 03/01/20  Carlean Jews, NP    Family History Family History  Problem Relation Age of Onset   Diabetes Father    Diabetes Mother    Heart disease Maternal Grandmother    Breast cancer Neg Hx     Social History Social History   Tobacco Use   Smoking status: Former   Smokeless tobacco: Never  Vaping Use   Vaping status: Never Used  Substance Use Topics   Alcohol use: Yes    Alcohol/week:  1.0 standard drink of alcohol    Types: 1 Standard drinks or equivalent per week    Comment: occasional drink, once a month   Drug use: No     Allergies   Morphine, Penicillins, Macrobid [nitrofurantoin macrocrystal], and Benadryl [diphenhydramine hcl]   Review of Systems Review of Systems  Constitutional:  Negative for fever.  HENT:  Positive for congestion and sore throat. Negative for ear pain and rhinorrhea.   Eyes:  Positive for pain, discharge and redness. Negative for photophobia, itching and visual disturbance.  Respiratory:  Positive for cough. Negative for shortness of breath and wheezing.      Physical Exam Triage Vital Signs ED Triage Vitals  Encounter Vitals Group     BP      Systolic BP Percentile      Diastolic BP Percentile      Pulse      Resp  Temp      Temp src      SpO2      Weight      Height      Head Circumference      Peak Flow      Pain Score      Pain Loc      Pain Education      Exclude from Growth Chart    No data found.  Updated Vital Signs BP 138/82 (BP Location: Right Arm)   Pulse 84   Temp 98.2 F (36.8 C) (Oral)   Resp 19   SpO2 97%   Visual Acuity Right Eye Distance:   Left Eye Distance:   Bilateral Distance:    Right Eye Near:   Left Eye Near:    Bilateral Near:     Physical Exam Vitals and nursing note reviewed.  Constitutional:      Appearance: Normal appearance. She is not ill-appearing.  HENT:     Head: Normocephalic and atraumatic.     Right Ear: Tympanic membrane, ear canal and external ear normal. There is no impacted cerumen.     Left Ear: Tympanic membrane, ear canal and external ear normal. There is no impacted cerumen.     Nose: Congestion and rhinorrhea present.     Comments: Bilateral nasal mucosa is edematous and erythematous.  There is bloody discharge in the left nare.    Mouth/Throat:     Mouth: Mucous membranes are moist.     Pharynx: Oropharynx is clear. Posterior oropharyngeal erythema  present. No oropharyngeal exudate.     Comments: Soft tonsillar pillars are erythematous and mildly injected.  No significant edema or exudate noted.  Posterior oropharynx also demonstrates erythema and injection with clear postnasal drip. Eyes:     Extraocular Movements: Extraocular movements intact.     Conjunctiva/sclera: Conjunctivae normal.     Pupils: Pupils are equal, round, and reactive to light.     Comments: Labral conjunctiva of the right eye is normal in appearance but there is mild erythema to the sclera of the eye.  Pupils equal round reactive and normal red light reflex.  No appreciable discharge at this time.  Cardiovascular:     Rate and Rhythm: Normal rate and regular rhythm.     Pulses: Normal pulses.     Heart sounds: Normal heart sounds. No murmur heard.    No friction rub. No gallop.  Pulmonary:     Effort: Pulmonary effort is normal.     Breath sounds: Normal breath sounds. No wheezing, rhonchi or rales.  Musculoskeletal:     Cervical back: Normal range of motion and neck supple. No tenderness.  Lymphadenopathy:     Cervical: No cervical adenopathy.  Skin:    General: Skin is warm and dry.     Capillary Refill: Capillary refill takes less than 2 seconds.     Findings: No rash.  Neurological:     General: No focal deficit present.     Mental Status: She is alert and oriented to person, place, and time.      UC Treatments / Results  Labs (all labs ordered are listed, but only abnormal results are displayed) Labs Reviewed  GROUP A STREP BY PCR    EKG   Radiology DG Chest 2 View  Result Date: 02/26/2023 CLINICAL DATA:  Productive cough for 1 week. EXAM: CHEST - 2 VIEW COMPARISON:  03/17/2020 FINDINGS: The heart size and mediastinal contours are within normal limits. Both lungs  are clear. The visualized skeletal structures are unremarkable. IMPRESSION: No active cardiopulmonary disease. Electronically Signed   By: Danae Orleans M.D.   On: 02/26/2023 09:20     Procedures Procedures (including critical care time)  Medications Ordered in UC Medications - No data to display  Initial Impression / Assessment and Plan / UC Course  I have reviewed the triage vital signs and the nursing notes.  Pertinent labs & imaging results that were available during my care of the patient were reviewed by me and considered in my medical decision making (see chart for details).   Patient is a very pleasant, nontoxic-appearing 62 year old female presenting for evaluation of 1 week worth of respiratory symptoms as outlined HPI above.  She reports that she has been around her grandchildren who have been sick with colds and her husband contracted similar symptoms prior to the onset of hers.  His symptoms have resolved but hers continued to linger.  She is reporting a productive cough for a green sputum but denies shortness breath or wheezing.  Her lungs are clear to auscultation all fields.  Her work of breathing is normal and she can speak in full sentences but dyspnea or tachypnea.  O2 sat on room air is 97%.  I will obtain a chest x-ray to rule out the presence of pneumonia given her productive cough.  She also has erythema and injection of bilateral tonsillar pillars and the soft palate but no appreciable edema or exudate.  I will order a strep PCR to evaluate for the presence of strep.  Strep PCR is negative.  Chest x-ray independently reviewed and evaluated by me.  Pression: There is a patchy haziness to the bilateral perihilar regions that is more prominent on the left.  No definitive infiltrate appreciated on exam.  Lungs are well aerated.  Cardiomediastinal silhouette appears normal.  Radiology overread is pending. Radiology impression states there is no active cardiopulmonary disease.  I will discharge patient home with a diagnosis of viral URI with cough and start her on Atrovent nasal spray, Tessalon Perles, Promethazine DM cough syrup.  I suspect that her  conjunctivitis is most likely related to her respiratory virus though I will cover her with Vigamox 1 drop 3 times daily x 7 days.   Final Clinical Impressions(s) / UC Diagnoses   Final diagnoses:  Viral URI with cough  Conjunctivitis of right eye, unspecified conjunctivitis type     Discharge Instructions      Your testing today was negative for strep and your chest x-ray did not demonstrate any evidence of pneumonia.  I do believe you have a viral respiratory infection which is causing your symptoms.    This upper respiratory virus may also be contributing to your eye symptoms, though I will cover you with antibiotics for conjunctivitis empirically.  Take 1 drop of Vigamox in your right eye 3 times a day for a week.  If your left eye develops symptoms also treat it.  Use the Atrovent nasal spray, 2 squirts in each nostril every 6 hours, as needed for runny nose and postnasal drip.  Use the Tessalon Perles every 8 hours during the day.  Take them with a small sip of water.  They may give you some numbness to the base of your tongue or a metallic taste in your mouth, this is normal.  Use the Promethazine DM cough syrup at bedtime for cough and congestion.  It will make you drowsy so do not take it during the  day.  Return for reevaluation or see your primary care provider for any new or worsening symptoms.      ED Prescriptions     Medication Sig Dispense Auth. Provider   benzonatate (TESSALON) 100 MG capsule Take 2 capsules (200 mg total) by mouth every 8 (eight) hours. 21 capsule Becky Augusta, NP   ipratropium (ATROVENT) 0.06 % nasal spray Place 2 sprays into both nostrils 4 (four) times daily. 15 mL Becky Augusta, NP   promethazine-dextromethorphan (PROMETHAZINE-DM) 6.25-15 MG/5ML syrup Take 5 mLs by mouth 4 (four) times daily as needed. 118 mL Becky Augusta, NP   moxifloxacin (VIGAMOX) 0.5 % ophthalmic solution Place 1 drop into both eyes 3 (three) times daily for 7 days. 3 mL  Becky Augusta, NP      PDMP not reviewed this encounter.   Becky Augusta, NP 02/26/23 [id number]

## 2023-02-26 NOTE — Discharge Instructions (Addendum)
Your testing today was negative for strep and your chest x-ray did not demonstrate any evidence of pneumonia.  I do believe you have a viral respiratory infection which is causing your symptoms.    This upper respiratory virus may also be contributing to your eye symptoms, though I will cover you with antibiotics for conjunctivitis empirically.  Take 1 drop of Vigamox in your right eye 3 times a day for a week.  If your left eye develops symptoms also treat it.  Use the Atrovent nasal spray, 2 squirts in each nostril every 6 hours, as needed for runny nose and postnasal drip.  Use the Tessalon Perles every 8 hours during the day.  Take them with a small sip of water.  They may give you some numbness to the base of your tongue or a metallic taste in your mouth, this is normal.  Use the Promethazine DM cough syrup at bedtime for cough and congestion.  It will make you drowsy so do not take it during the day.  Return for reevaluation or see your primary care provider for any new or worsening symptoms.

## 2023-02-26 NOTE — ED Triage Notes (Signed)
Patient states that she's been dealing with a cold x 1 week. Productive cough. Sore throat started yesterday. Patient states that she's been waking up with crusty sore eye s x 3 days.

## 2023-03-01 ENCOUNTER — Ambulatory Visit
Admission: RE | Admit: 2023-03-01 | Discharge: 2023-03-01 | Disposition: A | Discharge status: Home / Self Care | Payer: 59 | Source: Ambulatory Visit | Attending: Internal Medicine | Admitting: Internal Medicine

## 2023-03-01 DIAGNOSIS — R102 Pelvic and perineal pain: Secondary | ICD-10-CM

## 2023-03-01 DIAGNOSIS — N1330 Unspecified hydronephrosis: Secondary | ICD-10-CM | POA: Diagnosis not present

## 2023-03-01 DIAGNOSIS — R1084 Generalized abdominal pain: Secondary | ICD-10-CM | POA: Diagnosis present

## 2023-03-01 DIAGNOSIS — R1013 Epigastric pain: Secondary | ICD-10-CM | POA: Diagnosis not present

## 2023-03-02 LAB — IGP, APTIMA HPV: HPV Aptima: NEGATIVE

## 2023-03-03 ENCOUNTER — Ambulatory Visit
Admission: RE | Admit: 2023-03-03 | Discharge: 2023-03-03 | Disposition: A | Discharge status: Home / Self Care | Payer: 59 | Source: Ambulatory Visit | Attending: Internal Medicine | Admitting: Internal Medicine

## 2023-03-03 ENCOUNTER — Telehealth: Payer: Self-pay

## 2023-03-03 DIAGNOSIS — K219 Gastro-esophageal reflux disease without esophagitis: Secondary | ICD-10-CM | POA: Insufficient documentation

## 2023-03-03 NOTE — Progress Notes (Signed)
Results reviewed and will be discussed on next visit, continue on Pepcid or omeprazole otc as discussed on visit

## 2023-03-03 NOTE — Telephone Encounter (Signed)
Spoke with patient regarding UGI results.

## 2023-03-03 NOTE — Telephone Encounter (Signed)
-----   Message from [facility] sent at 03/03/2023 10:14 AM EST ----- Results reviewed and will be discussed on next visit, continue on Pepcid or omeprazole otc as discussed on visit

## 2023-03-23 ENCOUNTER — Ambulatory Visit: Payer: 59 | Admitting: Nurse Practitioner

## 2023-03-23 ENCOUNTER — Encounter: Payer: Self-pay | Admitting: Nurse Practitioner

## 2023-03-23 VITALS — BP 130/82 | HR 77 | Temp 98.0°F | Resp 16 | Ht 68.5 in | Wt 191.0 lb

## 2023-03-23 DIAGNOSIS — N2889 Other specified disorders of kidney and ureter: Secondary | ICD-10-CM | POA: Diagnosis not present

## 2023-03-23 DIAGNOSIS — E6609 Other obesity due to excess calories: Secondary | ICD-10-CM

## 2023-03-23 DIAGNOSIS — E66811 Obesity, class 1: Secondary | ICD-10-CM

## 2023-03-23 DIAGNOSIS — N951 Menopausal and female climacteric states: Secondary | ICD-10-CM | POA: Diagnosis not present

## 2023-03-23 DIAGNOSIS — Z6831 Body mass index (BMI) 31.0-31.9, adult: Secondary | ICD-10-CM | POA: Diagnosis not present

## 2023-03-23 MED ORDER — PHENTERMINE HCL 15 MG PO CAPS: 15.0000 mg | ORAL_CAPSULE | ORAL | 1 refills | Status: DC

## 2023-03-23 NOTE — Progress Notes (Signed)
[facility] [address] [place], [place] [address]  Internal MEDICINE  Office Visit Note  Patient Name: Ruth Morales  [id number]  [id number]  Date of Service: 03/23/2023  Chief Complaint  Patient presents with   Follow-up    Weight loss    HPI Tayana presents for a follow-up visit for lab and imaging results, weight loss.  Abdominal pain and indigestion -- symptoms are almost completely resolved.  Pain with holding urine when having urgency right after urinating. Mild right pelviectasis on abdominal CT scan.  Upper GI imaging showed Moderate lower esophageal dysmotility with mildly delayed relaxation of the lower esophageal sphincter causing brief stasis of contrast. Pap smear was unsatisfactory and HPV negative.  Weight loss -- lost 7 lbs on phentermine per clinic scale but her weight on her home scale was 187.8 lbs this morning so that is about a 10 lbs weight loss since last visit. Tried the topiramate but states she did not feel good taking it.    Current Medication: Outpatient Encounter Medications as of 03/23/2023  Medication Sig   benzonatate (TESSALON) 100 MG capsule Take 2 capsules (200 mg total) by mouth every 8 (eight) hours.   Calcium Citrate-Vitamin D3 315-6.25 MG-MCG TABS Take by mouth.   celecoxib (CELEBREX) 100 MG capsule Take 100 mg by mouth 2 (two) times daily.   cyclobenzaprine (FLEXERIL) 10 MG tablet Take 1 tablet (10 mg total) by mouth at bedtime.   ergocalciferol (DRISDOL) 1.25 MG (50000 UT) capsule Take 1 capsule (50,000 Units total) by mouth once a week.   estradiol (CLIMARA - DOSED IN MG/24 HR) 0.05 mg/24hr patch Place 1 patch (0.05 mg total) onto the skin once a week.   ibuprofen (ADVIL,MOTRIN) 200 MG tablet Take 600 mg by mouth every 6 (six) hours as needed for headache or moderate pain.    ipratropium (ATROVENT) 0.06 % nasal spray Place 2 sprays into both nostrils 4 (four) times daily.   loperamide (IMODIUM) 2 MG capsule Take 4 mg by mouth 2  (two) times daily.   medroxyPROGESTERone (PROVERA) 2.5 MG tablet Take 1 tablet (2.5 mg total) by mouth daily.   promethazine-dextromethorphan (PROMETHAZINE-DM) 6.25-15 MG/5ML syrup Take 5 mLs by mouth 4 (four) times daily as needed.   [DISCONTINUED] phentermine 15 MG capsule Take 1 capsule (15 mg total) by mouth every morning.   [DISCONTINUED] topiramate (TOPAMAX) 25 MG capsule Take 1 capsule (25 mg total) by mouth 2 (two) times daily. Start once daily, may increase to twice daily   phentermine 15 MG capsule Take 1 capsule (15 mg total) by mouth every morning.   [DISCONTINUED] progesterone (PROMETRIUM) 100 MG capsule Take 1 capsule (100 mg total) by mouth daily.   No facility-administered encounter medications on file as of 03/23/2023.    Surgical History: Past Surgical History:  Procedure Laterality Date   BREAST BIOPSY Left 2003   needle bx-neg   BREAST SURGERY Bilateral 2004   breast reduction   CATARACT EXTRACTION W/PHACO Left 02/06/2018   Procedure: CATARACT EXTRACTION PHACO AND INTRAOCULAR LENS PLACEMENT (IOC);  Surgeon: Galen Manila, MD;  Location: [facility];  Service: Ophthalmology;  Laterality: Left;  [place] 00:31.5 CDE 3.49 Fluid pack Lot # [id number]   CATARACT EXTRACTION W/PHACO Right 11/03/2021   Procedure: CATARACT EXTRACTION PHACO AND INTRAOCULAR LENS PLACEMENT (IOC) RIGHT 5.83 00:35.4;  Surgeon: Galen Manila, MD;  Location: [facility];  Service: Ophthalmology;  Laterality: Right;   COLON SURGERY  2004   colon removed   CTR  EXTRACORPOREAL SHOCK WAVE LITHOTRIPSY Right 02/26/2015   Procedure: EXTRACORPOREAL SHOCK WAVE LITHOTRIPSY (ESWL);  Surgeon: Lorraine Lax, MD;  Location: [facility];  Service: Urology;  Laterality: Right;   REDUCTION MAMMAPLASTY Bilateral 2005    Medical History: Past Medical History:  Diagnosis Date   Anxiety 11/21/2013   Arthritis    Breath shortness 11/21/2013   Crohn disease (HCC)    Dizziness 11/21/2013   Extensor  tenosynovitis of wrist 12/24/2013   Ganglion cyst of wrist 12/24/2013   Hypotension, postural 11/22/2013   Psoriasis    Skin cancer    Thyroid disease     Family History: Family History  Problem Relation Age of Onset   Diabetes Father    Diabetes Mother    Heart disease Maternal Grandmother    Breast cancer Neg Hx     Social History   Socioeconomic History   Marital status: Married    Spouse name: Not on file   Number of children: Not on file   Years of education: Not on file   Highest education level: Not on file  Occupational History   Not on file  Tobacco Use   Smoking status: Former   Smokeless tobacco: Never  Vaping Use   Vaping status: Never Used  Substance and Sexual Activity   Alcohol use: Yes    Alcohol/week: 1.0 standard drink of alcohol    Types: 1 Standard drinks or equivalent per week    Comment: occasional drink, once a month   Drug use: No   Sexual activity: Yes    Partners: Male  Other Topics Concern   Not on file  Social History Narrative   Not on file   Social Drivers of Health   Financial Resource Strain: Not on file  Food Insecurity: Not on file  Transportation Needs: Not on file  Physical Activity: Not on file  Stress: Not on file  Social Connections: Not on file  Intimate Partner Violence: Not on file      Review of Systems  Constitutional:  Positive for appetite change and unexpected weight change. Negative for chills and fatigue.  HENT:  Negative for congestion, rhinorrhea, sneezing and sore throat.   Eyes:  Negative for redness.  Respiratory:  Negative for cough, chest tightness, shortness of breath and wheezing.   Cardiovascular: Negative.  Negative for chest pain and palpitations.  Gastrointestinal:  Negative for abdominal pain, constipation, diarrhea, nausea and vomiting.  Genitourinary:  Negative for dysuria and frequency.  Musculoskeletal:  Positive for arthralgias, back pain and neck pain. Negative for joint swelling.   Skin:  Negative for rash.  Neurological: Negative.  Negative for tremors and numbness.  Hematological:  Negative for adenopathy. Does not bruise/bleed easily.  Psychiatric/Behavioral:  Negative for behavioral problems (Depression), sleep disturbance and suicidal ideas. The patient is not nervous/anxious.     Vital Signs: BP 130/82   Pulse 77   Temp 98 F (36.7 C)   Resp 16   Ht 5' 8.5" (1.74 m)   Wt 191 lb (86.6 kg)   SpO2 97%   BMI 28.62 kg/m    Physical Exam Vitals reviewed.  Constitutional:      General: She is not in acute distress.    Appearance: Normal appearance. She is obese. She is not ill-appearing.  HENT:     Head: Normocephalic and atraumatic.  Eyes:     Pupils: Pupils are equal, round, and reactive to light.  Cardiovascular:     Rate and Rhythm: Normal rate  and regular rhythm.  Pulmonary:     Effort: Pulmonary effort is normal. No respiratory distress.  Neurological:     Mental Status: She is alert and oriented to person, place, and time.  Psychiatric:        Mood and Affect: Mood normal.        Behavior: Behavior normal.        Assessment/Plan: 1. Pelviectasis, renal Referred to urology - Ambulatory referral to Urology  2. Vasomotor symptoms due to menopause Continue climara patch and provera tablet as prescribed.   3. Class 1 obesity due to excess calories with serious comorbidity and body mass index (BMI) of 31.0 to 31.9 in adult Continue phentermine as prescribed. Follow up in 8 weeks for weigh in - phentermine 15 MG capsule; Take 1 capsule (15 mg total) by mouth every morning.  Dispense: 30 capsule; Refill: 1   General Counseling: Dezarae verbalizes understanding of the findings of todays visit and agrees with plan of treatment. I have discussed any further diagnostic evaluation that may be needed or ordered today. We also reviewed her medications today. she has been encouraged to call the office with any questions or concerns that should arise  related to todays visit.    Orders Placed This Encounter  Procedures   Ambulatory referral to Urology    Meds ordered this encounter  Medications   phentermine 15 MG capsule    Sig: Take 1 capsule (15 mg total) by mouth every morning.    Dispense:  30 capsule    Refill:  1    Do not run through insurance patient paying out of pocket    Return in about 8 weeks (around 05/18/2023) for F/U, Weight loss, Jaline Pincock PCP.   Total time spent:30 Minutes Time spent includes review of chart, medications, test results, and follow up plan with the patient.   [place] Controlled Substance Database was reviewed by me.  This patient was seen by Sallyanne Kuster, FNP-C in collaboration with Dr. Beverely Risen as a part of collaborative care agreement.   Kie Calvin R. Tedd Sias, MSN, FNP-C Internal medicine

## 2023-04-07 ENCOUNTER — Telehealth: Payer: Self-pay

## 2023-04-07 NOTE — Telephone Encounter (Signed)
Patient called to see if it is safe to take AZO until upcoming urology appointment. Per AA, advised patient that it is okay.

## 2023-04-19 ENCOUNTER — Other Ambulatory Visit: Payer: Self-pay

## 2023-04-19 DIAGNOSIS — M810 Age-related osteoporosis without current pathological fracture: Secondary | ICD-10-CM

## 2023-04-19 DIAGNOSIS — N951 Menopausal and female climacteric states: Secondary | ICD-10-CM

## 2023-04-19 MED ORDER — MEDROXYPROGESTERONE ACETATE 2.5 MG PO TABS: 2.5000 mg | ORAL_TABLET | Freq: Every day | ORAL | 3 refills | Status: DC

## 2023-05-09 ENCOUNTER — Encounter: Payer: Self-pay | Admitting: Urology

## 2023-05-09 ENCOUNTER — Ambulatory Visit (INDEPENDENT_AMBULATORY_CARE_PROVIDER_SITE_OTHER): Payer: 59 | Admitting: Urology

## 2023-05-09 VITALS — BP 151/86 | HR 85 | Ht 69.0 in | Wt 184.0 lb

## 2023-05-09 DIAGNOSIS — N1339 Other hydronephrosis: Secondary | ICD-10-CM | POA: Diagnosis not present

## 2023-05-09 DIAGNOSIS — Z87442 Personal history of urinary calculi: Secondary | ICD-10-CM

## 2023-05-09 DIAGNOSIS — N2889 Other specified disorders of kidney and ureter: Secondary | ICD-10-CM

## 2023-05-09 LAB — URINALYSIS, COMPLETE
Bilirubin, UA: NEGATIVE
Glucose, UA: NEGATIVE
Ketones, UA: NEGATIVE
Nitrite, UA: NEGATIVE
RBC, UA: NEGATIVE
Specific Gravity, UA: >1.03 — ABNORMAL HIGH (ref 1.005–1.030)
Urobilinogen, Ur: 0.2 mg/dL (ref 0.2–1.0)
pH, UA: 5.5 (ref 5.0–7.5)

## 2023-05-09 LAB — MICROSCOPIC EXAMINATION: WBC, UA: >30 /[HPF] — AB (ref 0–5)

## 2023-05-09 LAB — BLADDER SCAN AMB NON-IMAGING: Scan Result: 15

## 2023-05-09 NOTE — Progress Notes (Signed)
I,Amy L Pierron,acting as a scribe for Vanna Scotland, MD.,have documented all relevant documentation on the behalf of Vanna Scotland, MD,as directed by  Vanna Scotland, MD while in the presence of Vanna Scotland, MD.  05/09/2023 10:31 AM   Ruth Morales 25-Sep-1960 [id number]  Referring provider: Sallyanne Kuster, NP [address] [place],  [place] [address]  Chief Complaint  Patient presents with   Establish Care    HPI: 63 year-old female presents today for further evaluation of incidental right mild pelviectasis.  Notably she was having some epigastric pain in November 2024 and underwent an abdominal ultrasound which showed some incidental right pelviectasis. The bladder was not visualized on this study.  Her creatinine previously was normal but hadn't been checked in quite some time.   She has a personal history of kidney stones. Her last CT scan in 2016 showed a right sided stone burden. She had a follow-up renal ultrasound in 2016 also that did not show any overt hydronephrosis but the right kidney was fuller than the left.   She remembers she was told to come with a full bladder prior to her ultrasound in November. She can't remember if they continued with the ultrasound after having her empty her bladder.   She said she has had symptoms similar to those associated with a UTI including feeling she has a full bladder but when she goes to urinate very little comes out along with some pressure and burning. She sometimes would take Azo which seems to help.  She is asymptomatic today but has these issues couple times a week for the past month or so.  Results for orders placed or performed in visit on 05/09/23  Bladder Scan (Post Void Residual) in office  Result Value Ref Range   Scan Result 15     PMH: Past Medical History:  Diagnosis Date   Anxiety 11/21/2013   Arthritis    Breath shortness 11/21/2013   Crohn disease (HCC)    Dizziness 11/21/2013   Extensor tenosynovitis  of wrist 12/24/2013   Ganglion cyst of wrist 12/24/2013   Hypotension, postural 11/22/2013   Psoriasis    Skin cancer    Thyroid disease     Surgical History: Past Surgical History:  Procedure Laterality Date   BREAST BIOPSY Left 2003   needle bx-neg   BREAST SURGERY Bilateral 2004   breast reduction   CATARACT EXTRACTION W/PHACO Left 02/06/2018   Procedure: CATARACT EXTRACTION PHACO AND INTRAOCULAR LENS PLACEMENT (IOC);  Surgeon: Galen Manila, MD;  Location: [facility];  Service: Ophthalmology;  Laterality: Left;  [place] 00:31.5 CDE 3.49 Fluid pack Lot # [id number]   CATARACT EXTRACTION W/PHACO Right 11/03/2021   Procedure: CATARACT EXTRACTION PHACO AND INTRAOCULAR LENS PLACEMENT (IOC) RIGHT 5.83 00:35.4;  Surgeon: Galen Manila, MD;  Location: [facility];  Service: Ophthalmology;  Laterality: Right;   COLON SURGERY  2004   colon removed   CTR     EXTRACORPOREAL SHOCK WAVE LITHOTRIPSY Right 02/26/2015   Procedure: EXTRACORPOREAL SHOCK WAVE LITHOTRIPSY (ESWL);  Surgeon: Lorraine Lax, MD;  Location: [facility];  Service: Urology;  Laterality: Right;   REDUCTION MAMMAPLASTY Bilateral 2005    Home Medications:  Allergies as of 05/09/2023       Reactions   Morphine Other (See Comments)   Hallucination.   Penicillins Anaphylaxis, Other (See Comments)   Headaches per patient Has patient had a PCN reaction causing immediate rash, facial/tongue/throat swelling, SOB or lightheadedness with hypotension: Unknown Has patient had a  PCN reaction causing severe rash involving mucus membranes or skin necrosis: No Has patient had a PCN reaction that required hospitalization: No Has patient had a PCN reaction occurring within the last 10 years: No If all of the above answers are "NO", then may proceed with Cephalosporin use.   Macrobid [nitrofurantoin Macrocrystal] Other (See Comments)   Unknown   Benadryl [diphenhydramine Hcl] Anxiety, Other (See Comments)   Per patient  "craziness"        Medication List        Accurate as of May 09, 2023 10:31 AM. If you have any questions, ask your nurse or doctor.          STOP taking these medications    benzonatate 100 MG capsule Commonly known as: TESSALON Stopped by: Vanna Scotland   cyclobenzaprine 10 MG tablet Commonly known as: FLEXERIL Stopped by: Vanna Scotland   ergocalciferol 1.25 MG (50000 UT) capsule Commonly known as: Drisdol Stopped by: Vanna Scotland   ibuprofen 200 MG tablet Commonly known as: ADVIL Stopped by: Vanna Scotland   ipratropium 0.06 % nasal spray Commonly known as: ATROVENT Stopped by: Vanna Scotland   promethazine-dextromethorphan 6.25-15 MG/5ML syrup Commonly known as: PROMETHAZINE-DM Stopped by: Vanna Scotland       TAKE these medications    AZO TABS PO Take by mouth. 1-2 times a week   Calcium Citrate-Vitamin D3 315-6.25 MG-MCG Tabs Take by mouth.   celecoxib 100 MG capsule Commonly known as: CELEBREX Take 100 mg by mouth 2 (two) times daily.   estradiol 0.05 mg/24hr patch Commonly known as: CLIMARA - Dosed in mg/24 hr Place 1 patch (0.05 mg total) onto the skin once a week.   loperamide 2 MG capsule Commonly known as: IMODIUM Take 4 mg by mouth 2 (two) times daily.   medroxyPROGESTERone 2.5 MG tablet Commonly known as: PROVERA Take 1 tablet (2.5 mg total) by mouth daily.   phentermine 15 MG capsule Take 1 capsule (15 mg total) by mouth every morning.   progesterone 100 MG capsule Commonly known as: PROMETRIUM Take 100 mg by mouth daily.        Allergies:  Allergies  Allergen Reactions   Morphine Other (See Comments)    Hallucination.   Penicillins Anaphylaxis and Other (See Comments)    Headaches per patient Has patient had a PCN reaction causing immediate rash, facial/tongue/throat swelling, SOB or lightheadedness with hypotension: Unknown Has patient had a PCN reaction causing severe rash involving mucus membranes or  skin necrosis: No Has patient had a PCN reaction that required hospitalization: No Has patient had a PCN reaction occurring within the last 10 years: No If all of the above answers are "NO", then may proceed with Cephalosporin use.    Macrobid [employer] Macrocrystal] Other (See Comments)    Unknown   Benadryl [Diphenhydramine Hcl] Anxiety and Other (See Comments)    Per patient "craziness"    Family History: Family History  Problem Relation Age of Onset   Diabetes Father    Diabetes Mother    Heart disease Maternal Grandmother    Breast cancer Neg Hx     Social History:  reports that she has quit smoking. She has never used smokeless tobacco. She reports current alcohol use of about 1.0 standard drink of alcohol per week. She reports that she does not use drugs.   Physical Exam: BP (!) 151/86   Pulse 85   Ht 5\' 9"  (1.753 m)   Wt 184 lb (83.5 kg)  BMI 27.17 kg/m   Constitutional:  Alert and oriented, No acute distress. HEENT: Central AT, moist mucus membranes.  Trachea midline, no masses. Neurologic: Grossly intact, no focal deficits, moving all 4 extremities. Psychiatric: Normal mood and affect.   Pertinent Imaging: Results for orders placed during the hospital encounter of 03/23/15  US Renal  Narrative CLINICAL DATA:  Flank pain.  Personal history of nephrolithiasis.  EXAM: RENAL / URINARY TRACT ULTRASOUND COMPLETE  COMPARISON:  CT of the abdomen and pelvis 02/17/2015  FINDINGS: Right Kidney:  Length: 10.5 cm, within normal limits. Echogenicity within normal limits. No mass or hydronephrosis visualized.  Left Kidney:  Length: 11.1 cm, within normal limits. Echogenicity within normal limits. No mass or hydronephrosis visualized.  Bladder:  Appears normal for degree of bladder distention.  IMPRESSION: Negative bilateral renal ultrasound.  Electronically Signed By: Marin Roberts M.D. On: 03/23/2015 08:26 Results for orders placed during the  hospital encounter of 02/17/15  CT RENAL STONE STUDY  Narrative CLINICAL DATA:  RIGHT flank pain since 01/15/2015, no gross hematuria, nephrolithiasis; personal history of Crohn's disease with total colectomy, former smoker  EXAM: CT ABDOMEN AND PELVIS WITHOUT CONTRAST  TECHNIQUE: Multidetector CT imaging of the abdomen and pelvis was performed following the standard protocol without IV contrast. Sagittal and coronal MPR images reconstructed from axial data set.  COMPARISON:  03/12/2014  FINDINGS: Lung bases clear.  Liver, gallbladder, spleen, pancreas, kidneys, and adrenal glands grossly unremarkable for technique.  Specifically, no RIGHT hydronephrosis or RIGHT renal calculus are identified.  However, newly seen is a 5 x 4 mm distal RIGHT ureteral calculus several cm above the ureterovesical junction ; this was previously located at the inferior pole of the RIGHT kidney.  No significant ureteral dilatation.  Prior colectomy with ileorectal versus ileoanal anastomosis.  Visualized bowel loops are unopacified and incompletely distended, making it impossible to exclude subtle wall thickening at multiple sites, including the distal anastomosis.  No definite areas of bowel wall thickening, dilatation, or point of obstruction seen.  Stomach unremarkable.  Scattered normal sized para-aortic and mesenteric lymph nodes.  Minimal atherosclerotic calcification.  Unremarkable uterus and adnexa.  No mass, adenopathy, free air or free fluid.  Tiny umbilical hernia containing fat.  Osseous structures unremarkable.  IMPRESSION: Post colectomy as above.  5 x 4 mm distal RIGHT ureteral calculus several cm above the ureterovesical junction without hydronephrosis or hydroureter.  Tiny umbilical hernia containing fat.  Electronically Signed By: Ulyses Southward M.D. On: 02/17/2015 10:35 Narrative & Impression  CLINICAL DATA:  Epigastric pain   EXAM: ABDOMEN ULTRASOUND  COMPLETE   COMPARISON:  Renal stone CT 02/17/2015   FINDINGS: Gallbladder: No gallstones or wall thickening visualized. No sonographic Murphy sign noted by sonographer.   Common bile duct: Diameter: 4 mm   Liver: No focal lesion identified. Within normal limits in parenchymal echogenicity. Portal vein is patent on color Doppler imaging with normal direction of blood flow towards the liver.   IVC: No abnormality visualized.   Pancreas: Visualized portion unremarkable.   Spleen: Size and appearance within normal limits.   Right Kidney: Length: 10.0 cm. Mild pelviectasis. Normal renal cortical thickness and echogenicity. No mass.   Left Kidney: Length: 10.8 cm. Echogenicity within normal limits. No mass or hydronephrosis visualized.   Abdominal aorta: No aneurysm visualized.   Other findings: None.   IMPRESSION: 1. No cholelithiasis or sonographic evidence for acute cholecystitis. 2. Mild right pelviectasis.   Electronically Signed   By: Francis Gaines.D.  On: 03/01/2023 16:04   Narrative & Impression  : PROCEDURE: US PELVIS COMPLETE WITH TRANSVAGINAL   HISTORY: Patient is a 63 y/o F with pelvic pain x 2 months. G3P2A1.   COMPARISON: U/S transvaginal 07/11/2017, U/S pelvis 06/26/2017.   TECHNIQUE: Two-dimensional transabdominal grayscale and color Doppler ultrasound of the pelvis was performed. Transvaginal was performed.   FINDINGS: The uterus is anteverted in position and measures 5.2 x 3.5 x 4.3 cm. It demonstrates a homogeneous echotexture. The endometrium measures 0.2 cm and demonstrates a normal homogeneous echotexture.   The bilateral ovaries are not visualized.   There is no fluid present within the cul-de-sac.   IMPRESSION: 1. Nonvisualization of the bilateral ovaries. Otherwise, unremarkable ultrasound of the pelvis.   Thank you for allowing [place] to assist in the care of this patient.   Electronically Signed   By: Lestine Box M.D.   On:  03/09/2023 14:37  Personally reviewed and compared the above images and agree with radiologic interpretation.   Assessment & Plan:    1. Hydronephrosis, right sided  - Explained how having asymmetric kidneys is not always pathologic or worrisome. Contributing factors of this include compression of the ureter tube, scar tissue from a previous stone incident, genetics (being born with it), or possibly even artifactual.  - Due to her intermittent symptoms and history of kidney stone on the right side, recommend a ct scan without contrast to evaluate if a small stone may be present at this time. Order placed for this and she will get it done in the near future. Will call her with the results.   2. History of kidney stones  - Last known stone event in 2016.   - Having symptoms recently that are similar to a UTI as indicated above suspicious for a new stone. Plan for CT scan.  - Urine culture sent and awaiting results. Will inform her if infection is present when results return.  Return in about 4 weeks (around 06/06/2023) for ct scan.  I have reviewed the above documentation for accuracy and completeness, and I agree with the above.   Vanna Scotland, MD    [facility] [address], [address] [place], [place] [address] [phone]

## 2023-05-10 ENCOUNTER — Other Ambulatory Visit: Payer: Self-pay | Admitting: Nurse Practitioner

## 2023-05-10 DIAGNOSIS — M810 Age-related osteoporosis without current pathological fracture: Secondary | ICD-10-CM

## 2023-05-10 DIAGNOSIS — N951 Menopausal and female climacteric states: Secondary | ICD-10-CM

## 2023-05-12 ENCOUNTER — Encounter: Payer: Self-pay | Admitting: Urology

## 2023-05-12 LAB — CULTURE, URINE COMPREHENSIVE

## 2023-05-15 ENCOUNTER — Other Ambulatory Visit: Payer: Self-pay

## 2023-05-15 MED ORDER — NITROFURANTOIN MONOHYD MACRO 100 MG PO CAPS: 100.0000 mg | ORAL_CAPSULE | Freq: Two times a day (BID) | ORAL | 0 refills | Status: DC

## 2023-05-16 ENCOUNTER — Encounter: Payer: No Typology Code available for payment source | Admitting: Nurse Practitioner

## 2023-05-18 ENCOUNTER — Ambulatory Visit: Payer: No Typology Code available for payment source | Admitting: Nurse Practitioner

## 2023-05-23 ENCOUNTER — Ambulatory Visit: Payer: Self-pay

## 2023-05-30 ENCOUNTER — Ambulatory Visit (INDEPENDENT_AMBULATORY_CARE_PROVIDER_SITE_OTHER): Payer: No Typology Code available for payment source | Admitting: Nurse Practitioner

## 2023-05-30 ENCOUNTER — Encounter: Payer: Self-pay | Admitting: Nurse Practitioner

## 2023-05-30 VITALS — BP 140/90 | HR 95 | Temp 98.3°F | Resp 16 | Ht 69.0 in | Wt 182.4 lb

## 2023-05-30 DIAGNOSIS — J069 Acute upper respiratory infection, unspecified: Secondary | ICD-10-CM | POA: Diagnosis not present

## 2023-05-30 DIAGNOSIS — B974 Respiratory syncytial virus as the cause of diseases classified elsewhere: Secondary | ICD-10-CM | POA: Diagnosis not present

## 2023-05-30 DIAGNOSIS — E66811 Obesity, class 1: Secondary | ICD-10-CM | POA: Diagnosis not present

## 2023-05-30 DIAGNOSIS — Z20828 Contact with and (suspected) exposure to other viral communicable diseases: Secondary | ICD-10-CM

## 2023-05-30 MED ORDER — BENZONATATE 200 MG PO CAPS
200.0000 mg | ORAL_CAPSULE | Freq: Two times a day (BID) | ORAL | 0 refills | Status: DC | PRN
Start: 2023-05-30 — End: 2023-06-20

## 2023-05-30 MED ORDER — PHENTERMINE HCL 15 MG PO CAPS: 15.0000 mg | ORAL_CAPSULE | ORAL | 1 refills | Status: DC

## 2023-05-30 MED ORDER — AZITHROMYCIN 250 MG PO TABS: ORAL_TABLET | ORAL | 0 refills | Status: AC

## 2023-05-30 MED ORDER — PREDNISONE 10 MG (21) PO TBPK: ORAL_TABLET | ORAL | 0 refills | Status: DC

## 2023-05-30 NOTE — Progress Notes (Signed)
[facility] [address] [place], [place] [address]  Internal MEDICINE  Office Visit Note  Patient Name: Ruth Morales  [id number]  [id number]  Date of Service: 05/30/2023  Chief Complaint  Patient presents with   Acute Visit    Cough, tightness in chest lower yellowish/greenish mucus. Been exposed to RSV     HPI Ruth Morales presents for an acute sick visit for URI symptoms  --onset of symptoms was more than a week ago.  --reports that her symptoms started with a productive cough, yellow green sputum, chest tightness, fatigue, headache, wheezing, and ear pain.  --exposed to RSV, her mother in law was recently diagnosed         Current Medication:  Outpatient Encounter Medications as of 05/30/2023  Medication Sig   azithromycin (ZITHROMAX) 250 MG tablet Take 2 tablets on day 1, then 1 tablet daily on days 2 through 5   benzonatate (TESSALON) 200 MG capsule Take 1 capsule (200 mg total) by mouth 2 (two) times daily as needed for cough.   Calcium Citrate-Vitamin D3 315-6.25 MG-MCG TABS Take by mouth.   celecoxib (CELEBREX) 100 MG capsule Take 100 mg by mouth 2 (two) times daily.   estradiol (CLIMARA - DOSED IN MG/24 HR) 0.05 mg/24hr patch PLACE 1 PATCH (0.05 MG TOTAL) ONTO THE SKIN ONCE A WEEK.   loperamide (IMODIUM) 2 MG capsule Take 4 mg by mouth 2 (two) times daily.   medroxyPROGESTERone (PROVERA) 2.5 MG tablet Take 1 tablet (2.5 mg total) by mouth daily.   nitrofurantoin, macrocrystal-monohydrate, (MACROBID) 100 MG capsule Take 1 capsule (100 mg total) by mouth 2 (two) times daily.   Phenazopyridine HCl (AZO TABS PO) Take by mouth. 1-2 times a week   predniSONE (STERAPRED UNI-PAK 21 TAB) 10 MG (21) TBPK tablet Use as directed for 6 days   progesterone (PROMETRIUM) 100 MG capsule Take 100 mg by mouth daily.   [DISCONTINUED] phentermine 15 MG capsule Take 1 capsule (15 mg total) by mouth every morning.   phentermine 15 MG capsule Take 1 capsule (15 mg total) by mouth  every morning.   No facility-administered encounter medications on file as of 05/30/2023.      Medical History: Past Medical History:  Diagnosis Date   Anxiety 11/21/2013   Arthritis    Breath shortness 11/21/2013   Crohn disease (HCC)    Dizziness 11/21/2013   Extensor tenosynovitis of wrist 12/24/2013   Ganglion cyst of wrist 12/24/2013   Hypotension, postural 11/22/2013   Psoriasis    Skin cancer    Thyroid disease      Vital Signs: BP (!) 140/90   Pulse 95   Temp 98.3 F (36.8 C)   Resp 16   Ht 5\' 9"  (1.753 m)   Wt 182 lb 6.4 oz (82.7 kg)   SpO2 96%   BMI 26.94 kg/m    Review of Systems  Constitutional:  Positive for appetite change, chills and fatigue. Negative for fever.  HENT:  Positive for ear pain. Negative for congestion, postnasal drip, rhinorrhea, sinus pressure, sinus pain and sore throat.   Respiratory:  Positive for cough, chest tightness and wheezing. Negative for shortness of breath.   Cardiovascular: Negative.  Negative for chest pain and palpitations.  Gastrointestinal: Negative.  Negative for diarrhea, nausea and vomiting.  Musculoskeletal: Negative.  Negative for myalgias.  Neurological:  Positive for headaches.    Physical Exam Vitals reviewed.  Constitutional:      General: She is not in acute distress.  Appearance: Normal appearance. She is ill-appearing.  HENT:     Head: Normocephalic and atraumatic.     Right Ear: Tympanic membrane, ear canal and external ear normal.     Left Ear: Tympanic membrane, ear canal and external ear normal.     Nose: Mucosal edema, congestion and rhinorrhea present.     Right Turbinates: Swollen and pale.     Left Turbinates: Swollen and pale.     Right Sinus: No maxillary sinus tenderness or frontal sinus tenderness.     Left Sinus: No maxillary sinus tenderness or frontal sinus tenderness.     Mouth/Throat:     Mouth: Mucous membranes are moist.     Pharynx: Posterior oropharyngeal erythema present.   Eyes:     Pupils: Pupils are equal, round, and reactive to light.  Cardiovascular:     Rate and Rhythm: Normal rate and regular rhythm.     Heart sounds: Normal heart sounds. No murmur heard. Pulmonary:     Effort: Pulmonary effort is normal. No accessory muscle usage or respiratory distress.     Breath sounds: Examination of the right-upper field reveals wheezing. Examination of the left-upper field reveals wheezing and rales. Examination of the left-middle field reveals rales. Examination of the left-lower field reveals rales. Wheezing and rales present.  Neurological:     Mental Status: She is alert and oriented to person, place, and time.  Psychiatric:        Mood and Affect: Mood normal.        Behavior: Behavior normal. Behavior is cooperative.       Assessment/Plan: 1. Acute URI due to respiratory syncytial virus (RSV) (Primary) Zpak prescribed, take until gone, prednisone taper and cough medication ordered, take as prescribed.  - azithromycin (ZITHROMAX) 250 MG tablet; Take 2 tablets on day 1, then 1 tablet daily on days 2 through 5  Dispense: 6 tablet; Refill: 0 - predniSONE (STERAPRED UNI-PAK 21 TAB) 10 MG (21) TBPK tablet; Use as directed for 6 days  Dispense: 21 tablet; Refill: 0 - benzonatate (TESSALON) 200 MG capsule; Take 1 capsule (200 mg total) by mouth 2 (two) times daily as needed for cough.  Dispense: 30 capsule; Refill: 0  2. Exposure to respiratory syncytial virus (RSV) Treatment focused on symptom management, improving respiratory status and and preventing secondary bacterial infection.   3. Class 1 obesity due to excess calories with serious comorbidity and body mass index (BMI) of 31.0 to 31.9 in adult Continue phentermine as prescribed, follow up at upcoming CPE - phentermine 15 MG capsule; Take 1 capsule (15 mg total) by mouth every morning.  Dispense: 30 capsule; Refill: 1    General Counseling: [name] verbalizes understanding of the findings of todays  visit and agrees with plan of treatment. I have discussed any further diagnostic evaluation that may be needed or ordered today. We also reviewed her medications today. she has been encouraged to call the office with any questions or concerns that should arise related to todays visit.    Counseling:    No orders of the defined types were placed in this encounter.   Meds ordered this encounter  Medications   azithromycin (ZITHROMAX) 250 MG tablet    Sig: Take 2 tablets on day 1, then 1 tablet daily on days 2 through 5    Dispense:  6 tablet    Refill:  0   predniSONE (STERAPRED UNI-PAK 21 TAB) 10 MG (21) TBPK tablet    Sig: Use as directed for  6 days    Dispense:  21 tablet    Refill:  0   benzonatate (TESSALON) 200 MG capsule    Sig: Take 1 capsule (200 mg total) by mouth 2 (two) times daily as needed for cough.    Dispense:  30 capsule    Refill:  0    Fill new script today   phentermine 15 MG capsule    Sig: Take 1 capsule (15 mg total) by mouth every morning.    Dispense:  30 capsule    Refill:  1    Do not run through insurance patient paying out of pocket    Return if symptoms worsen or fail to improve, for reschedule AWV.  [place] Controlled Substance Database was reviewed by me for overdose risk score (ORS)  Time spent:30 Minutes Time spent with patient included reviewing progress notes, labs, imaging studies, and discussing plan for follow up.   This patient was seen by Sallyanne Kuster, FNP-C in collaboration with Dr. Beverely Risen as a part of collaborative care agreement.  Peter Daquila R. Tedd Sias, MSN, FNP-C Internal Medicine

## 2023-06-09 ENCOUNTER — Ambulatory Visit
Admission: RE | Admit: 2023-06-09 | Discharge: 2023-06-09 | Disposition: A | Discharge status: Home / Self Care | Payer: No Typology Code available for payment source | Source: Ambulatory Visit | Attending: Urology | Admitting: Urology

## 2023-06-09 DIAGNOSIS — N2889 Other specified disorders of kidney and ureter: Secondary | ICD-10-CM | POA: Diagnosis present

## 2023-06-09 DIAGNOSIS — Z87442 Personal history of urinary calculi: Secondary | ICD-10-CM | POA: Diagnosis present

## 2023-06-09 DIAGNOSIS — N1339 Other hydronephrosis: Secondary | ICD-10-CM | POA: Diagnosis present

## 2023-06-19 ENCOUNTER — Encounter: Payer: Self-pay | Admitting: Urology

## 2023-06-20 ENCOUNTER — Ambulatory Visit (INDEPENDENT_AMBULATORY_CARE_PROVIDER_SITE_OTHER): Payer: No Typology Code available for payment source | Admitting: Nurse Practitioner

## 2023-06-20 ENCOUNTER — Encounter: Payer: Self-pay | Admitting: Nurse Practitioner

## 2023-06-20 VITALS — BP 130/88 | HR 74 | Temp 96.1°F | Resp 16 | Ht 69.0 in | Wt 181.5 lb

## 2023-06-20 DIAGNOSIS — E559 Vitamin D deficiency, unspecified: Secondary | ICD-10-CM

## 2023-06-20 DIAGNOSIS — R202 Paresthesia of skin: Secondary | ICD-10-CM

## 2023-06-20 DIAGNOSIS — E782 Mixed hyperlipidemia: Secondary | ICD-10-CM | POA: Diagnosis not present

## 2023-06-20 DIAGNOSIS — M25512 Pain in left shoulder: Secondary | ICD-10-CM | POA: Diagnosis not present

## 2023-06-20 DIAGNOSIS — R7301 Impaired fasting glucose: Secondary | ICD-10-CM | POA: Diagnosis not present

## 2023-06-20 DIAGNOSIS — E66811 Obesity, class 1: Secondary | ICD-10-CM

## 2023-06-20 DIAGNOSIS — E538 Deficiency of other specified B group vitamins: Secondary | ICD-10-CM

## 2023-06-20 DIAGNOSIS — E6609 Other obesity due to excess calories: Secondary | ICD-10-CM

## 2023-06-20 DIAGNOSIS — R200 Anesthesia of skin: Secondary | ICD-10-CM

## 2023-06-20 DIAGNOSIS — Z6831 Body mass index (BMI) 31.0-31.9, adult: Secondary | ICD-10-CM

## 2023-06-20 DIAGNOSIS — Z0001 Encounter for general adult medical examination with abnormal findings: Secondary | ICD-10-CM | POA: Diagnosis not present

## 2023-06-20 MED ORDER — PHENTERMINE HCL 15 MG PO CAPS: 15.0000 mg | ORAL_CAPSULE | ORAL | 1 refills | Status: DC

## 2023-06-20 NOTE — Progress Notes (Signed)
 [facility] [address] [place], [place] [address]  Internal MEDICINE  Office Visit Note  Patient Name: PEYTON ROSSNER  [id number]  [id number]  Date of Service: 06/20/2023  Chief Complaint  Patient presents with   Annual Exam    HPI Ainsleigh presents for an annual well visit and physical exam.  Well-appearing 63 y.o. female withanxiety, hypertension, psoriasis, and ulcerative colitis s/p stage 2 ileal pouch Routine CRC screening: due in 2026 with [facility] Routine mammogram: due in November this year  DEXA scan: due next year  Pap smear: needs a repeat pap smear, had unsat result in November.  Eye exam and/or foot exam: Labs: due for routine labs  New or worsening pain: none  Needs 2 dose shingles vaccine series Past 2-3 weeks left shoulder is hurts but has also been having a numb and tingling feeling in the right arm that radiates all the way down the arm and into the fingertips.    Current Medication: Outpatient Encounter Medications as of 06/20/2023  Medication Sig   Calcium Citrate-Vitamin D3 315-6.25 MG-MCG TABS Take by mouth.   celecoxib (CELEBREX) 100 MG capsule Take 100 mg by mouth 2 (two) times daily.   estradiol (CLIMARA - DOSED IN MG/24 HR) 0.05 mg/24hr patch PLACE 1 PATCH (0.05 MG TOTAL) ONTO THE SKIN ONCE A WEEK.   loperamide (IMODIUM) 2 MG capsule Take 4 mg by mouth 2 (two) times daily.   medroxyPROGESTERone (PROVERA) 2.5 MG tablet Take 1 tablet (2.5 mg total) by mouth daily.   progesterone (PROMETRIUM) 100 MG capsule Take 100 mg by mouth daily.   [DISCONTINUED] benzonatate (TESSALON) 200 MG capsule Take 1 capsule (200 mg total) by mouth 2 (two) times daily as needed for cough.   [DISCONTINUED] nitrofurantoin, macrocrystal-monohydrate, (MACROBID) 100 MG capsule Take 1 capsule (100 mg total) by mouth 2 (two) times daily.   [DISCONTINUED] Phenazopyridine HCl (AZO TABS PO) Take by mouth. 1-2 times a week   [DISCONTINUED] phentermine 15 MG capsule Take 1 capsule  (15 mg total) by mouth every morning.   [DISCONTINUED] predniSONE (STERAPRED UNI-PAK 21 TAB) 10 MG (21) TBPK tablet Use as directed for 6 days   phentermine 15 MG capsule Take 1 capsule (15 mg total) by mouth every morning.   No facility-administered encounter medications on file as of 06/20/2023.    Surgical History: Past Surgical History:  Procedure Laterality Date   BREAST BIOPSY Left 2003   needle bx-neg   BREAST SURGERY Bilateral 2004   breast reduction   CATARACT EXTRACTION W/PHACO Left 02/06/2018   Procedure: CATARACT EXTRACTION PHACO AND INTRAOCULAR LENS PLACEMENT (IOC);  Surgeon: Galen Manila, MD;  Location: [facility] ORS;  Service: Ophthalmology;  Laterality: Left;  [place] 00:31.5 CDE 3.49 Fluid pack Lot # [id number]   CATARACT EXTRACTION W/PHACO Right 11/03/2021   Procedure: CATARACT EXTRACTION PHACO AND INTRAOCULAR LENS PLACEMENT (IOC) RIGHT 5.83 00:35.4;  Surgeon: Galen Manila, MD;  Location: [facility];  Service: Ophthalmology;  Laterality: Right;   COLON SURGERY  2004   colon removed   CTR     EXTRACORPOREAL SHOCK WAVE LITHOTRIPSY Right 02/26/2015   Procedure: EXTRACORPOREAL SHOCK WAVE LITHOTRIPSY (ESWL);  Surgeon: Lorraine Lax, MD;  Location: [facility] OR[facility];  Service: Urology;  Laterality: Right;   REDUCTION MAMMAPLASTY Bilateral 2005    Medical History: Past Medical History:  Diagnosis Date   Anxiety 11/21/2013   Arthritis    Breath shortness 11/21/2013   Crohn disease (HCC)    Dizziness 11/21/2013  Extensor tenosynovitis of wrist 12/24/2013   Ganglion cyst of wrist 12/24/2013   Hypotension, postural 11/22/2013   Psoriasis    Skin cancer    Thyroid disease     Family History: Family History  Problem Relation Age of Onset   Diabetes Father    Diabetes Mother    Heart disease Maternal Grandmother    Breast cancer Neg Hx     Social History   Socioeconomic History   Marital status: Married    Spouse name: Not on file   Number of children: Not  on file   Years of education: Not on file   Highest education level: Not on file  Occupational History   Not on file  Tobacco Use   Smoking status: Former   Smokeless tobacco: Never  Vaping Use   Vaping status: Never Used  Substance and Sexual Activity   Alcohol use: Yes    Alcohol/week: 1.0 standard drink of alcohol    Types: 1 Standard drinks or equivalent per week    Comment: occasional drink, once a month   Drug use: No   Sexual activity: Yes    Partners: Male  Other Topics Concern   Not on file  Social History Narrative   Not on file   Social Drivers of Health   Financial Resource Strain: Not on file  Food Insecurity: Not on file  Transportation Needs: Not on file  Physical Activity: Not on file  Stress: Not on file  Social Connections: Not on file  Intimate Partner Violence: Not on file      Review of Systems  Constitutional:  Negative for activity change, appetite change, chills, fatigue, fever and unexpected weight change.  HENT: Negative.  Negative for congestion, ear pain, rhinorrhea, sore throat and trouble swallowing.   Eyes: Negative.   Respiratory: Negative.  Negative for cough, chest tightness, shortness of breath and wheezing.   Cardiovascular: Negative.  Negative for chest pain.  Gastrointestinal: Negative.  Negative for abdominal pain, blood in stool, constipation, diarrhea, nausea and vomiting.  Endocrine: Negative.   Genitourinary: Negative.  Negative for difficulty urinating, dysuria, frequency, hematuria and urgency.  Musculoskeletal:  Positive for arthralgias (left shoulder pain). Negative for back pain, joint swelling, myalgias and neck pain.       Restless legs  Skin: Negative.  Negative for rash and wound.  Allergic/Immunologic: Negative.  Negative for immunocompromised state.  Neurological:  Positive for numbness (numbness and tingling of right arm). Negative for dizziness, seizures and headaches.  Hematological: Negative.    Psychiatric/Behavioral: Negative.  Negative for behavioral problems, self-injury and suicidal ideas. The patient is not nervous/anxious.     Vital Signs: BP 130/88   Pulse 74   Temp (!) 96.1 F (35.6 C)   Resp 16   Ht 5\' 9"  (1.753 m)   Wt 181 lb 8 oz (82.3 kg)   SpO2 99%   BMI 26.80 kg/m    Physical Exam Vitals reviewed.  Constitutional:      General: She is awake. She is not in acute distress.    Appearance: Normal appearance. She is well-developed and well-groomed. She is obese. She is not ill-appearing or diaphoretic.  HENT:     Head: Normocephalic and atraumatic.     Right Ear: Tympanic membrane, ear canal and external ear normal.     Left Ear: Tympanic membrane, ear canal and external ear normal.     Nose: Nose normal. No congestion or rhinorrhea.     Mouth/Throat:  Lips: Pink.     Mouth: Mucous membranes are moist.     Pharynx: Oropharynx is clear. Uvula midline. No oropharyngeal exudate or posterior oropharyngeal erythema.  Eyes:     General: Lids are normal. Vision grossly intact. Gaze aligned appropriately. No scleral icterus.       Right eye: No discharge.        Left eye: No discharge.     Extraocular Movements: Extraocular movements intact.     Conjunctiva/sclera: Conjunctivae normal.     Pupils: Pupils are equal, round, and reactive to light.     Funduscopic exam:    Right eye: Red reflex present.        Left eye: Red reflex present. Neck:     Thyroid: No thyromegaly.     Vascular: No carotid bruit or JVD.     Trachea: Trachea and phonation normal. No tracheal deviation.  Cardiovascular:     Rate and Rhythm: Normal rate and regular rhythm.     Pulses: Normal pulses.     Heart sounds: Normal heart sounds, S1 normal and S2 normal. No murmur heard.    No friction rub. No gallop.  Pulmonary:     Effort: Pulmonary effort is normal. No accessory muscle usage or respiratory distress.     Breath sounds: Normal breath sounds and air entry. No stridor. No  wheezing or rales.  Chest:     Chest wall: No tenderness.  Breasts:    Right: Normal. No swelling, bleeding, inverted nipple, mass, nipple discharge, skin change or tenderness.     Left: Normal. No swelling, bleeding, inverted nipple, mass, nipple discharge, skin change or tenderness.  Abdominal:     General: Bowel sounds are normal. There is no distension.     Palpations: Abdomen is soft. There is no shifting dullness, fluid wave, mass or pulsatile mass.     Tenderness: There is no abdominal tenderness. There is no guarding or rebound.  Musculoskeletal:        General: No tenderness or deformity.     Left shoulder: Decreased range of motion. Decreased strength.     Cervical back: Normal range of motion and neck supple.     Right lower leg: No edema.     Left lower leg: No edema.  Lymphadenopathy:     Cervical: No cervical adenopathy.     Upper Body:     Right upper body: No supraclavicular, axillary or pectoral adenopathy.     Left upper body: No supraclavicular, axillary or pectoral adenopathy.  Skin:    General: Skin is warm and dry.     Capillary Refill: Capillary refill takes less than 2 seconds.     Coloration: Skin is not pale.     Findings: No erythema or rash.  Neurological:     Mental Status: She is alert and oriented to person, place, and time.     Cranial Nerves: No cranial nerve deficit.     Motor: No abnormal muscle tone.     Coordination: Coordination normal.     Gait: Gait normal.     Deep Tendon Reflexes: Reflexes are normal and symmetric.  Psychiatric:        Mood and Affect: Mood normal.        Behavior: Behavior normal. Behavior is cooperative.        Thought Content: Thought content normal.        Judgment: Judgment normal.        Assessment/Plan: 1. Encounter for routine adult health  examination with abnormal findings (Primary) Age-appropriate preventive screenings and vaccinations discussed, annual physical exam completed. Routine labs for health  maintenance ordered, see below. PHM updated.   - CBC with Differential/Platelet - CMP14+EGFR - Lipid Profile - Vitamin D (25 hydroxy) - B12 and Folate Panel - Hgb A1C w/o eAG  2. Impaired fasting glucose Routine labs orders  - CBC with Differential/Platelet - CMP14+EGFR - Lipid Profile - Vitamin D (25 hydroxy) - B12 and Folate Panel - Hgb A1C w/o eAG  3. Mixed hyperlipidemia Routine labs ordered  - CBC with Differential/Platelet - CMP14+EGFR - Lipid Profile - Vitamin D (25 hydroxy) - B12 and Folate Panel - Hgb A1C w/o eAG  4. B12 deficiency Routine labs ordered  - CBC with Differential/Platelet - CMP14+EGFR - Lipid Profile - Vitamin D (25 hydroxy) - B12 and Folate Panel - Hgb A1C w/o eAG  5. Vitamin D deficiency Routine labs ordered  - CBC with Differential/Platelet - CMP14+EGFR - Lipid Profile - Vitamin D (25 hydroxy) - B12 and Folate Panel - Hgb A1C w/o eAG  6. Acute pain of left shoulder Discussed possible interventions. Patient will call [facility] for appt or go to their clinic during urgent care hours if the problem worsens  7. Numbness and tingling of right arm Discussed possible interventions. Patient will call [facility] for appt or go to their clinic during urgent care hours if the problem worsens  8. Class 1 obesity due to excess calories with serious comorbidity and body mass index (BMI) of 31.0 to 31.9 in adult Continue phentermine as prescribed. Follow up in 2 months  - phentermine 15 MG capsule; Take 1 capsule (15 mg total) by mouth every morning.  Dispense: 30 capsule; Refill: 1     General Counseling: Brandon verbalizes understanding of the findings of todays visit and agrees with plan of treatment. I have discussed any further diagnostic evaluation that may be needed or ordered today. We also reviewed her medications today. she has been encouraged to call the office with any questions or concerns that should arise related to todays  visit.    Orders Placed This Encounter  Procedures   CBC with Differential/Platelet   CMP14+EGFR   Lipid Profile   Vitamin D (25 hydroxy)   B12 and Folate Panel   Hgb A1C w/o eAG    Meds ordered this encounter  Medications   phentermine 15 MG capsule    Sig: Take 1 capsule (15 mg total) by mouth every morning.    Dispense:  30 capsule    Refill:  1    Do not run through insurance patient paying out of pocket    Return in about 2 months (around 08/20/2023) for f/u for repeat pap and weight loss, Maziah Keeling PCP.   Total time spent:30 Minutes Time spent includes review of chart, medications, test results, and follow up plan with the patient.   [place] Controlled Substance Database was reviewed by me.  This patient was seen by Sallyanne Kuster, FNP-C in collaboration with Dr. Beverely Risen as a part of collaborative care agreement.  Karsynn Deweese R. Tedd Sias, MSN, FNP-C Internal medicine

## 2023-06-21 LAB — LIPID PANEL
Chol/HDL Ratio: 2.6 ratio (ref 0.0–4.4)
Cholesterol, Total: 205 mg/dL — ABNORMAL HIGH (ref 100–199)
HDL: 78 mg/dL (ref >39)
LDL Chol Calc (NIH): 112 mg/dL — ABNORMAL HIGH (ref 0–99)
Triglycerides: 83 mg/dL (ref 0–149)
VLDL Cholesterol Cal: 15 mg/dL (ref 5–40)

## 2023-06-21 LAB — CBC WITH DIFFERENTIAL/PLATELET
Basophils Absolute: 0.1 10*3/uL (ref 0.0–0.2)
Basos: 1 %
EOS (ABSOLUTE): 0.2 10*3/uL (ref 0.0–0.4)
Eos: 4 %
Hematocrit: 40.9 % (ref 34.0–46.6)
Hemoglobin: 13.7 g/dL (ref 11.1–15.9)
Immature Grans (Abs): 0 10*3/uL (ref 0.0–0.1)
Immature Granulocytes: 0 %
Lymphocytes Absolute: 1.3 10*3/uL (ref 0.7–3.1)
Lymphs: 23 %
MCH: 31.2 pg (ref 26.6–33.0)
MCHC: 33.5 g/dL (ref 31.5–35.7)
MCV: 93 fL (ref 79–97)
Monocytes Absolute: 0.4 10*3/uL (ref 0.1–0.9)
Monocytes: 8 %
Neutrophils Absolute: 3.8 10*3/uL (ref 1.4–7.0)
Neutrophils: 64 %
Platelets: 212 10*3/uL (ref 150–450)
RBC: 4.39 x10E6/uL (ref 3.77–5.28)
RDW: 12.9 % (ref 11.7–15.4)
WBC: 5.9 10*3/uL (ref 3.4–10.8)

## 2023-06-21 LAB — CMP14+EGFR
ALT: 12 IU/L (ref 0–32)
AST: 19 IU/L (ref 0–40)
Albumin: 4.1 g/dL (ref 3.9–4.9)
Alkaline Phosphatase: 74 IU/L (ref 44–121)
BUN/Creatinine Ratio: 20 (ref 12–28)
BUN: 19 mg/dL (ref 8–27)
Bilirubin Total: 0.6 mg/dL (ref 0.0–1.2)
CO2: 20 mmol/L (ref 20–29)
Calcium: 9.5 mg/dL (ref 8.7–10.3)
Chloride: 104 mmol/L (ref 96–106)
Creatinine, Ser: 0.94 mg/dL (ref 0.57–1.00)
Globulin, Total: 2.5 g/dL (ref 1.5–4.5)
Glucose: 89 mg/dL (ref 70–99)
Potassium: 4.9 mmol/L (ref 3.5–5.2)
Sodium: 138 mmol/L (ref 134–144)
Total Protein: 6.6 g/dL (ref 6.0–8.5)
eGFR: 68 mL/min/{1.73_m2} (ref >59)

## 2023-06-21 LAB — B12 AND FOLATE PANEL
Folate: 5.8 ng/mL (ref >3.0)
Vitamin B-12: 451 pg/mL (ref 232–1245)

## 2023-06-21 LAB — VITAMIN D 25 HYDROXY (VIT D DEFICIENCY, FRACTURES): Vit D, 25-Hydroxy: 30.9 ng/mL (ref 30.0–100.0)

## 2023-06-21 LAB — HGB A1C W/O EAG: Hgb A1c MFr Bld: 5.5 % (ref 4.8–5.6)

## 2023-06-30 ENCOUNTER — Other Ambulatory Visit: Payer: Self-pay | Admitting: Nurse Practitioner

## 2023-06-30 DIAGNOSIS — N951 Menopausal and female climacteric states: Secondary | ICD-10-CM

## 2023-06-30 DIAGNOSIS — M810 Age-related osteoporosis without current pathological fracture: Secondary | ICD-10-CM

## 2023-08-11 ENCOUNTER — Other Ambulatory Visit: Payer: Self-pay | Admitting: Nurse Practitioner

## 2023-08-11 DIAGNOSIS — N951 Menopausal and female climacteric states: Secondary | ICD-10-CM

## 2023-08-11 DIAGNOSIS — M810 Age-related osteoporosis without current pathological fracture: Secondary | ICD-10-CM

## 2023-08-11 NOTE — Telephone Encounter (Signed)
 Please review

## 2023-08-15 ENCOUNTER — Telehealth: Payer: Self-pay

## 2023-08-15 DIAGNOSIS — E6609 Other obesity due to excess calories: Secondary | ICD-10-CM

## 2023-08-16 MED ORDER — PHENTERMINE HCL 15 MG PO CAPS: 15.0000 mg | ORAL_CAPSULE | ORAL | 0 refills | Status: DC

## 2023-08-16 NOTE — Telephone Encounter (Signed)
 Patient notified

## 2023-08-22 ENCOUNTER — Encounter: Payer: Self-pay | Admitting: Nurse Practitioner

## 2023-08-22 ENCOUNTER — Ambulatory Visit: Admitting: Nurse Practitioner

## 2023-08-22 VITALS — BP 128/84 | Temp 97.6°F | Resp 16 | Ht 69.0 in | Wt 179.4 lb

## 2023-08-22 DIAGNOSIS — R87615 Unsatisfactory cytologic smear of cervix: Secondary | ICD-10-CM

## 2023-08-22 DIAGNOSIS — E663 Overweight: Secondary | ICD-10-CM | POA: Diagnosis not present

## 2023-08-22 DIAGNOSIS — Z6826 Body mass index (BMI) 26.0-26.9, adult: Secondary | ICD-10-CM

## 2023-08-22 DIAGNOSIS — Z124 Encounter for screening for malignant neoplasm of cervix: Secondary | ICD-10-CM | POA: Diagnosis not present

## 2023-08-22 DIAGNOSIS — B3731 Acute candidiasis of vulva and vagina: Secondary | ICD-10-CM

## 2023-08-22 MED ORDER — PHENTERMINE HCL 30 MG PO CAPS: 30.0000 mg | ORAL_CAPSULE | ORAL | 1 refills | Status: DC

## 2023-08-22 MED ORDER — FLUCONAZOLE 150 MG PO TABS
150.0000 mg | ORAL_TABLET | Freq: Once | ORAL | 0 refills | Status: AC
Start: 2023-08-22 — End: 2023-08-22

## 2023-08-22 NOTE — Progress Notes (Signed)
 [facility] [address] [place], [place] [address]  Internal MEDICINE  Office Visit Note  Patient Name: Ruth Morales  [id number]  [id number]  Date of Service: 08/22/2023  Chief Complaint  Patient presents with   Follow-up    Repeat pap and weight loss     HPI Ruth Morales presents for a follow-up visit for weight los and repeat pap smear.  Weight loss -- has had her clothes are fitting better, is down a couple more lbs.  Repeat pap smear -- was HPV negative and unsat in November due to insuffient amount of cells.  Vaginal yeast infection -- itching and burning      Current Medication: Outpatient Encounter Medications as of 08/22/2023  Medication Sig   Calcium Citrate-Vitamin D3 315-6.25 MG-MCG TABS Take by mouth.   celecoxib (CELEBREX) 100 MG capsule Take 100 mg by mouth 2 (two) times daily.   estradiol  (CLIMARA  - DOSED IN MG/24 HR) 0.05 mg/24hr patch PLACE 1 PATCH (0.05 MG TOTAL) ONTO THE SKIN ONCE A WEEK.   fluconazole  (DIFLUCAN ) 150 MG tablet Take 1 tablet (150 mg total) by mouth once for 1 dose. May take an additional dose after 3 days if still symptomatic.   loperamide  (IMODIUM ) 2 MG capsule Take 4 mg by mouth 2 (two) times daily.   medroxyPROGESTERone  (PROVERA ) 2.5 MG tablet TAKE 1 TABLET BY MOUTH EVERY DAY   phentermine  30 MG capsule Take 1 capsule (30 mg total) by mouth every morning.   progesterone  (PROMETRIUM ) 100 MG capsule Take 100 mg by mouth daily.   [DISCONTINUED] phentermine  15 MG capsule Take 1 capsule (15 mg total) by mouth every morning.   No facility-administered encounter medications on file as of 08/22/2023.    Surgical History: Past Surgical History:  Procedure Laterality Date   BREAST BIOPSY Left 2003   needle bx-neg   BREAST SURGERY Bilateral 2004   breast reduction   CATARACT EXTRACTION W/PHACO Left 02/06/2018    Procedure: CATARACT EXTRACTION PHACO AND INTRAOCULAR LENS PLACEMENT (IOC);  Surgeon: Clair Crews, MD;  Location: [facility];  Service: Ophthalmology;  Laterality: Left;  US  00:31.5 CDE 3.49 Fluid pack Lot # [id number]   CATARACT EXTRACTION W/PHACO Right 11/03/2021   Procedure: CATARACT EXTRACTION PHACO AND INTRAOCULAR LENS PLACEMENT (IOC) RIGHT 5.83 00:35.4;  Surgeon: Clair Crews, MD;  Location: [facility];  Service: Ophthalmology;  Laterality: Right;   COLON SURGERY  2004   colon removed   CTR     EXTRACORPOREAL SHOCK WAVE LITHOTRIPSY Right 02/26/2015   Procedure: EXTRACORPOREAL SHOCK WAVE LITHOTRIPSY (ESWL);  Surgeon: Michelina Aho, MD;  Location: [facility];  Service: Urology;  Laterality: Right;   REDUCTION MAMMAPLASTY Bilateral 2005    Medical History: Past Medical History:  Diagnosis Date   Anxiety 11/21/2013   Arthritis    Breath shortness 11/21/2013   Crohn disease (HCC)    Dizziness 11/21/2013   Extensor tenosynovitis of wrist 12/24/2013   Ganglion cyst of wrist 12/24/2013   Hypotension, postural 11/22/2013   Psoriasis    Skin cancer    Thyroid  disease     Family History: Family History  Problem Relation Age of Onset   Diabetes Father    Diabetes Mother    Heart disease Maternal Grandmother    Breast cancer Neg Hx     Social History   Socioeconomic History   Marital status: Married    Spouse name: Not on file   Number of children: Not on file   Years of education:  Not on file   Highest education level: Not on file  Occupational History   Not on file  Tobacco Use   Smoking status: Former   Smokeless tobacco: Never  Vaping Use   Vaping status: Never Used  Substance and Sexual Activity   Alcohol use: Yes    Alcohol/week: 1.0 standard drink of alcohol    Types: 1 Standard drinks or equivalent per week    Comment: occasional drink, once a month   Drug use: No   Sexual activity: Yes    Partners: Male  Other Topics Concern   Not on file   Social History Narrative   Not on file   Social Drivers of Health   Financial Resource Strain: Not on file  Food Insecurity: Not on file  Transportation Needs: Not on file  Physical Activity: Not on file  Stress: Not on file  Social Connections: Not on file  Intimate Partner Violence: Not on file      Review of Systems  Constitutional:  Negative for chills, fatigue and unexpected weight change.  HENT:  Negative for congestion, postnasal drip, rhinorrhea, sneezing and sore throat.   Eyes:  Negative for redness.  Respiratory: Negative.  Negative for cough, chest tightness and shortness of breath.   Cardiovascular: Negative.  Negative for chest pain and palpitations.  Gastrointestinal:  Negative for abdominal pain, constipation, diarrhea, nausea and vomiting.  Genitourinary: Negative.  Negative for dysuria, frequency, menstrual problem, pelvic pain, vaginal bleeding, vaginal discharge and vaginal pain.       Vaginal itching and burning.   Musculoskeletal:  Negative for arthralgias, back pain, joint swelling and neck pain.  Skin:  Negative for rash.  Neurological: Negative.  Negative for tremors and numbness.  Hematological:  Negative for adenopathy. Does not bruise/bleed easily.  Psychiatric/Behavioral:  Negative for behavioral problems (Depression), sleep disturbance and suicidal ideas. The patient is not nervous/anxious.     Vital Signs: BP 128/84   Temp 97.6 F (36.4 C)   Resp 16   Ht 5\' 9"  (1.753 m)   Wt 179 lb 6.4 oz (81.4 kg) Comment: At home  BMI 26.49 kg/m    Physical Exam Vitals reviewed.  Constitutional:      General: She is not in acute distress.    Appearance: Normal appearance. She is not ill-appearing.  HENT:     Head: Normocephalic and atraumatic.  Eyes:     Pupils: Pupils are equal, round, and reactive to light.  Cardiovascular:     Rate and Rhythm: Normal rate and regular rhythm.  Pulmonary:     Effort: Pulmonary effort is normal. No respiratory  distress.  Abdominal:     Hernia: There is no hernia in the left inguinal area or right inguinal area.  Genitourinary:    General: Normal vulva.     Exam position: Lithotomy position.     Pubic Area: No rash or pubic lice.      Labia:        Right: No rash, tenderness, lesion or injury.        Left: No rash, tenderness, lesion or injury.      Urethra: No prolapse, urethral pain, urethral swelling or urethral lesion.     Vagina: Normal. No signs of injury and foreign body. No vaginal discharge, erythema, tenderness, bleeding, lesions or prolapsed vaginal walls.     Cervix: Normal. No cervical motion tenderness, discharge, friability, lesion, erythema, cervical bleeding or eversion.     Uterus: Normal. Not deviated, not fixed  and no uterine prolapse.      Adnexa: Right adnexa normal and left adnexa normal.       Right: No mass, tenderness or fullness.         Left: No mass, tenderness or fullness.       Rectum: No mass, anal fissure or external hemorrhoid.  Lymphadenopathy:     Lower Body: No right inguinal adenopathy. No left inguinal adenopathy.  Skin:    General: Skin is warm and dry.  Neurological:     Mental Status: She is alert and oriented to person, place, and time.  Psychiatric:        Mood and Affect: Mood normal.        Behavior: Behavior normal.        Assessment/Plan: 1. Unsatisfactory cervical Papanicolaou smear Repeat pap smear done  - IGP, Aptima HPV  2. Routine cervical smear Repeat pap smear done, routine normal pelvic exam.  - IGP, Aptima HPV  3. Vulvovaginal candidiasis Fluconazole  prescribed.  - fluconazole  (DIFLUCAN ) 150 MG tablet; Take 1 tablet (150 mg total) by mouth once for 1 dose. May take an additional dose after 3 days if still symptomatic.  Dispense: 3 tablet; Refill: 0  4. Overweight with body mass index (BMI) of 26 to 26.9 in adult (Primary) Continue phentermine  as prescribed. Follow up in 8 weeks for weigh in.  - phentermine  30 MG  capsule; Take 1 capsule (30 mg total) by mouth every morning.  Dispense: 30 capsule; Refill: 1   General Counseling: Daleigh verbalizes understanding of the findings of todays visit and agrees with plan of treatment. I have discussed any further diagnostic evaluation that may be needed or ordered today. We also reviewed her medications today. she has been encouraged to call the office with any questions or concerns that should arise related to todays visit.    No orders of the defined types were placed in this encounter.   Meds ordered this encounter  Medications   phentermine  30 MG capsule    Sig: Take 1 capsule (30 mg total) by mouth every morning.    Dispense:  30 capsule    Refill:  1    Note increased dose and fill new script today.   fluconazole  (DIFLUCAN ) 150 MG tablet    Sig: Take 1 tablet (150 mg total) by mouth once for 1 dose. May take an additional dose after 3 days if still symptomatic.    Dispense:  3 tablet    Refill:  0    Return in about 8 weeks (around 10/17/2023) for F/U, Weight loss, Lalah Durango PCP.   Total time spent:30 Minutes Time spent includes review of chart, medications, test results, and follow up plan with the patient.   [place] Controlled Substance Database was reviewed by me.  This patient was seen by Laurence Pons, FNP-C in collaboration with Dr. Verneta Gone as a part of collaborative care agreement.   Isaiah Torok R. Bobbi Burow, MSN, FNP-C Internal medicine

## 2023-08-28 ENCOUNTER — Ambulatory Visit: Payer: Self-pay | Admitting: Nurse Practitioner

## 2023-08-28 LAB — IGP, APTIMA HPV: HPV Aptima: NEGATIVE

## 2023-08-28 NOTE — Progress Notes (Signed)
 Please let the patient know that her pap smear was normal and negative for HPV. We can repeat the pap smear in 3 years if she desires but she will ago out of routine pap smears in October so we can also discontinue routine pap smears and only do another pap smear if something changes.

## 2023-08-29 NOTE — Telephone Encounter (Signed)
 Pt notified for pap smear result

## 2023-08-29 NOTE — Telephone Encounter (Signed)
-----   Message from [facility] sent at 08/28/2023  3:46 PM EDT ----- Please let the patient know that her pap smear was normal and negative for HPV. We can repeat the pap smear in 3 years if she desires but she will ago out of routine pap smears in October so we can also discontinue routine pap smears and only do another pap smear if something changes.

## 2023-09-16 ENCOUNTER — Encounter: Payer: Self-pay | Admitting: Nurse Practitioner

## 2023-10-18 ENCOUNTER — Encounter: Payer: Self-pay | Admitting: Nurse Practitioner

## 2023-10-18 ENCOUNTER — Ambulatory Visit: Admitting: Nurse Practitioner

## 2023-10-18 VITALS — BP 131/76 | HR 80 | Temp 97.0°F | Resp 16 | Ht 69.0 in | Wt 176.8 lb

## 2023-10-18 DIAGNOSIS — N951 Menopausal and female climacteric states: Secondary | ICD-10-CM

## 2023-10-18 DIAGNOSIS — E663 Overweight: Secondary | ICD-10-CM | POA: Diagnosis not present

## 2023-10-18 DIAGNOSIS — Z6826 Body mass index (BMI) 26.0-26.9, adult: Secondary | ICD-10-CM

## 2023-10-18 DIAGNOSIS — M810 Age-related osteoporosis without current pathological fracture: Secondary | ICD-10-CM | POA: Diagnosis not present

## 2023-10-18 DIAGNOSIS — Z8249 Family history of ischemic heart disease and other diseases of the circulatory system: Secondary | ICD-10-CM | POA: Insufficient documentation

## 2023-10-18 MED ORDER — ESTRADIOL 0.05 MG/24HR TD PTWK: 0.0500 mg | MEDICATED_PATCH | TRANSDERMAL | 3 refills | Status: AC

## 2023-10-18 MED ORDER — BENZPHETAMINE HCL 50 MG PO TABS: 50.0000 mg | ORAL_TABLET | Freq: Every day | ORAL | 1 refills | Status: DC

## 2023-10-18 NOTE — Progress Notes (Signed)
 [facility] [address] [place], [place] [address]  Internal MEDICINE  Office Visit Note  Patient Name: Ruth Morales  [id number]  [id number]  Date of Service: 10/18/2023  Chief Complaint  Patient presents with   Follow-up    Weight loss     HPI Zykiria presents for a follow-up visit for weight loss, CVD risk, vasomotor symptoms.  Weight loss -- current BMI is slightly elevated at 26.11. She tried diethylpropion  last year with no significant weight loss. Currently she has been taking phentermine , we increased the dose at a previous visit but she reports that the medication is causing headaches. She has also tried phendimetrazine  in the past. She is interested in trying wegovy but not sure if her insurance will cover it and she needs to discuss with her husband first. She is ok with trying something different such as a different appetite suppressant.  Increased risk of CVD or CV events due to significant family history of heart disease and/or MI in multiple female family members before age 76 and multiple female family members before age 65. Minimal 10-year ASCVD risk score of 4.5%.  Vasomotor symptoms -- using climara  patch which is working well, due for refills   The 10-year ASCVD risk score (Arnett DK, et al., 2019) is: 4.5%   Values used to calculate the score:     Age: 63 years     Clincally relevant sex: Female     Is Non-Hispanic African American: No     Diabetic: No     Tobacco smoker: No     Systolic Blood Pressure: 131 mmHg     Is BP treated: No     HDL Cholesterol: 78 mg/dL     Total Cholesterol: 205 mg/dL    Current Medication: Outpatient Encounter Medications as of 10/18/2023  Medication Sig   Benzphetamine  HCl 50 MG TABS Take 1 tablet (50 mg total) by mouth daily before breakfast.   Calcium Citrate-Vitamin D3 315-6.25 MG-MCG TABS Take by mouth.   celecoxib (CELEBREX) 100 MG capsule Take 100 mg by mouth 2 (two) times daily.   estradiol  (CLIMARA  - DOSED IN  MG/24 HR) 0.05 mg/24hr patch Place 1 patch (0.05 mg total) onto the skin once a week.   loperamide  (IMODIUM ) 2 MG capsule Take 4 mg by mouth 2 (two) times daily.   medroxyPROGESTERone  (PROVERA ) 2.5 MG tablet TAKE 1 TABLET BY MOUTH EVERY DAY   progesterone  (PROMETRIUM ) 100 MG capsule Take 100 mg by mouth daily.   [DISCONTINUED] estradiol  (CLIMARA  - DOSED IN MG/24 HR) 0.05 mg/24hr patch PLACE 1 PATCH (0.05 MG TOTAL) ONTO THE SKIN ONCE A WEEK.   [DISCONTINUED] phentermine  30 MG capsule Take 1 capsule (30 mg total) by mouth every morning.   No facility-administered encounter medications on file as of 10/18/2023.    Surgical History: Past Surgical History:  Procedure Laterality Date   BREAST BIOPSY Left 2003   needle bx-neg   BREAST SURGERY Bilateral 2004   breast reduction   CATARACT EXTRACTION W/PHACO Left 02/06/2018   Procedure: CATARACT EXTRACTION PHACO AND INTRAOCULAR LENS PLACEMENT (IOC);  Surgeon: Jaye Fallow, MD;  Location: [facility];  Service: Ophthalmology;  Laterality: Left;  US  00:31.5 CDE 3.49 Fluid pack Lot # [id number]   CATARACT EXTRACTION W/PHACO Right 11/03/2021   Procedure: CATARACT EXTRACTION PHACO AND INTRAOCULAR LENS PLACEMENT (IOC) RIGHT 5.83 00:35.4;  Surgeon: Jaye Fallow, MD;  Location: [facility];  Service: Ophthalmology;  Laterality: Right;   COLON SURGERY  2004  colon removed   CTR     EXTRACORPOREAL SHOCK WAVE LITHOTRIPSY Right 02/26/2015   Procedure: EXTRACORPOREAL SHOCK WAVE LITHOTRIPSY (ESWL);  Surgeon: Charlie JONETTA Pack, MD;  Location: [facility];  Service: Urology;  Laterality: Right;   REDUCTION MAMMAPLASTY Bilateral 2005    Medical History: Past Medical History:  Diagnosis Date   Anxiety 11/21/2013   Arthritis    Breath shortness 11/21/2013   Crohn disease (HCC)    Dizziness 11/21/2013   Extensor tenosynovitis of wrist 12/24/2013   Ganglion cyst of wrist 12/24/2013   Hypotension, postural 11/22/2013   Psoriasis    Skin cancer     Thyroid  disease     Family History: Family History  Problem Relation Age of Onset   Diabetes Father    Diabetes Mother    Heart disease Maternal Grandmother    Breast cancer Neg Hx     Social History   Socioeconomic History   Marital status: Married    Spouse name: Not on file   Number of children: Not on file   Years of education: Not on file   Highest education level: Not on file  Occupational History   Not on file  Tobacco Use   Smoking status: Former   Smokeless tobacco: Never  Vaping Use   Vaping status: Never Used  Substance and Sexual Activity   Alcohol use: Yes    Alcohol/week: 1.0 standard drink of alcohol    Types: 1 Standard drinks or equivalent per week    Comment: occasional drink, once a month   Drug use: No   Sexual activity: Yes    Partners: Male  Other Topics Concern   Not on file  Social History Narrative   Not on file   Social Drivers of Health   Financial Resource Strain: Not on file  Food Insecurity: Not on file  Transportation Needs: Not on file  Physical Activity: Not on file  Stress: Not on file  Social Connections: Not on file  Intimate Partner Violence: Not on file      Review of Systems  Constitutional:  Positive for appetite change and unexpected weight change. Negative for chills and fatigue.  HENT:  Negative for congestion, rhinorrhea, sneezing and sore throat.   Eyes:  Negative for redness.  Respiratory:  Negative for cough, chest tightness, shortness of breath and wheezing.   Cardiovascular: Negative.  Negative for chest pain and palpitations.  Gastrointestinal:  Negative for abdominal pain, constipation, diarrhea, nausea and vomiting.  Genitourinary:  Negative for dysuria and frequency.  Musculoskeletal:  Positive for arthralgias, back pain and neck pain. Negative for joint swelling.  Skin:  Negative for rash.  Neurological: Negative.  Negative for tremors and numbness.  Hematological:  Negative for adenopathy. Does not  bruise/bleed easily.  Psychiatric/Behavioral:  Negative for behavioral problems (Depression), sleep disturbance and suicidal ideas. The patient is not nervous/anxious.     Vital Signs: BP 131/76   Pulse 80   Temp (!) 97 F (36.1 C)   Resp 16   Ht 5' 9 (1.753 m)   Wt 176 lb 12.8 oz (80.2 kg) Comment: at home  SpO2 94%   BMI 26.11 kg/m    Physical Exam Vitals reviewed.  Constitutional:      General: She is not in acute distress.    Appearance: Normal appearance. She is obese. She is not ill-appearing.  HENT:     Head: Normocephalic and atraumatic.  Eyes:     Pupils: Pupils are equal, round, and  reactive to light.  Cardiovascular:     Rate and Rhythm: Normal rate and regular rhythm.  Pulmonary:     Effort: Pulmonary effort is normal. No respiratory distress.  Neurological:     Mental Status: She is alert and oriented to person, place, and time.  Psychiatric:        Mood and Affect: Mood normal.        Behavior: Behavior normal.        Assessment/Plan: 1. Vasomotor symptoms due to menopause (Primary) Continue climara  patch as prescribed.  - estradiol  (CLIMARA  - DOSED IN MG/24 HR) 0.05 mg/24hr patch; Place 1 patch (0.05 mg total) onto the skin once a week.  Dispense: 12 patch; Refill: 3  2. Postmenopausal bone loss Continue climara  patch as prescribed.  - estradiol  (CLIMARA  - DOSED IN MG/24 HR) 0.05 mg/24hr patch; Place 1 patch (0.05 mg total) onto the skin once a week.  Dispense: 12 patch; Refill: 3  3. Overweight with body mass index (BMI) of 26 to 26.9 in adult Discontinue phentermine . Start benzphetamine  as prescribed. Follow up in 8 weeks.  - Benzphetamine  HCl 50 MG TABS; Take 1 tablet (50 mg total) by mouth daily before breakfast.  Dispense: 30 tablet; Refill: 1  4. Family history of heart disease in female family member before age 47 Family history showing increased risk   5. Family history of heart disease in female family member before age 64 Family  history showing increased risk.    General Counseling: Jannae verbalizes understanding of the findings of todays visit and agrees with plan of treatment. I have discussed any further diagnostic evaluation that may be needed or ordered today. We also reviewed her medications today. she has been encouraged to call the office with any questions or concerns that should arise related to todays visit.    No orders of the defined types were placed in this encounter.   Meds ordered this encounter  Medications   Benzphetamine  HCl 50 MG TABS    Sig: Take 1 tablet (50 mg total) by mouth daily before breakfast.    Dispense:  30 tablet    Refill:  1    Do not run through insurance, patient will have a goodrx coupon   estradiol  (CLIMARA  - DOSED IN MG/24 HR) 0.05 mg/24hr patch    Sig: Place 1 patch (0.05 mg total) onto the skin once a week.    Dispense:  12 patch    Refill:  3    For future refills    Return in about 8 weeks (around 12/13/2023) for F/U, eval new med, Tamarra Geiselman PCP.   Total time spent:30 Minutes Time spent includes review of chart, medications, test results, and follow up plan with the patient.   [place] Controlled Substance Database was reviewed by me.  This patient was seen by Mardy Maxin, FNP-C in collaboration with Dr. Sigrid Bathe as a part of collaborative care agreement.   Jovanka Westgate R. Maxin, MSN, FNP-C Internal medicine

## 2023-10-25 ENCOUNTER — Telehealth: Payer: Self-pay

## 2023-11-02 MED ORDER — PHENTERMINE HCL 37.5 MG PO TABS: 37.5000 mg | ORAL_TABLET | Freq: Every day | ORAL | 1 refills | Status: DC

## 2023-11-02 NOTE — Telephone Encounter (Signed)
 Phentermine sent to pharmacy

## 2023-12-14 ENCOUNTER — Ambulatory Visit: Admitting: Nurse Practitioner

## 2023-12-15 ENCOUNTER — Other Ambulatory Visit: Payer: Self-pay | Admitting: Nurse Practitioner

## 2023-12-15 ENCOUNTER — Encounter: Payer: Self-pay | Admitting: Nurse Practitioner

## 2023-12-15 ENCOUNTER — Ambulatory Visit: Admitting: Nurse Practitioner

## 2023-12-15 VITALS — BP 131/72 | HR 73 | Temp 97.4°F | Resp 16 | Ht 69.0 in | Wt 176.4 lb

## 2023-12-15 DIAGNOSIS — Z6826 Body mass index (BMI) 26.0-26.9, adult: Secondary | ICD-10-CM

## 2023-12-15 DIAGNOSIS — E663 Overweight: Secondary | ICD-10-CM | POA: Diagnosis not present

## 2023-12-15 DIAGNOSIS — N951 Menopausal and female climacteric states: Secondary | ICD-10-CM | POA: Diagnosis not present

## 2023-12-15 DIAGNOSIS — R7303 Prediabetes: Secondary | ICD-10-CM

## 2023-12-15 DIAGNOSIS — Z8249 Family history of ischemic heart disease and other diseases of the circulatory system: Secondary | ICD-10-CM | POA: Diagnosis not present

## 2023-12-15 MED ORDER — BENZPHETAMINE HCL 50 MG PO TABS: 25.0000 mg | ORAL_TABLET | Freq: Every day | ORAL | 0 refills | Status: DC

## 2023-12-15 NOTE — Progress Notes (Signed)
 [facility] [address] [place], [place] [address]  Internal MEDICINE  Office Visit Note  Patient Name: Ruth Morales  [id number]  [id number]  Date of Service: 12/15/2023  Chief Complaint  Patient presents with   Follow-up    HPI Ruth Morales presents for a follow-up visit for weight Morales, prediabetes and vasomotor symptoms.  Weight Morales -- has tried phentermine , phendimetrazine  and diethylpropion . Ruth Morales insurance will not cover GLP-1 medications for weight Morales. Ruth Morales does not have fatty liver disease and Ruth Morales ASCVD risk score is low.  Prediabetes -- weight Morales will help this improve. Not currently on any diabetic medication Vasomotor symptoms due to menopause -- taking climara  and oral provera  tablets which is helping with symptoms.   The 10-year ASCVD risk score (Arnett DK, et al., 2019) is: 4.5%   Values used to calculate the score:     Age: 63 years     Clincally relevant sex: Female     Is Non-Hispanic African American: No     Diabetic: No     Tobacco smoker: No     Systolic Blood Pressure: 131 mmHg     Is BP treated: No     HDL Cholesterol: 78 mg/dL     Total Cholesterol: 205 mg/dL    Current Medication: Outpatient Encounter Medications as of 12/15/2023  Medication Sig   Benzphetamine  HCl 50 MG TABS Take 0.5 tablets (25 mg total) by mouth daily before breakfast.   Calcium Citrate-Vitamin D3 315-6.25 MG-MCG TABS Take by mouth.   celecoxib (CELEBREX) 100 MG capsule Take 100 mg by mouth 2 (two) times daily.   estradiol  (CLIMARA  - DOSED IN MG/24 HR) 0.05 mg/24hr patch Place 1 patch (0.05 mg total) onto the skin once a week.   loperamide  (IMODIUM ) 2 MG capsule Take 4 mg by mouth 2 (two) times daily.   medroxyPROGESTERone  (PROVERA ) 2.5 MG tablet TAKE 1 TABLET BY MOUTH EVERY DAY   phentermine  (ADIPEX-P ) 37.5 MG tablet Take 1 tablet (37.5 mg total) by mouth daily before breakfast.   progesterone  (PROMETRIUM ) 100 MG capsule Take 100 mg by mouth daily.   No  facility-administered encounter medications on file as of 12/15/2023.    Surgical History: Past Surgical History:  Procedure Laterality Date   BREAST BIOPSY Left 2003   needle bx-neg   BREAST SURGERY Bilateral 2004   breast reduction   CATARACT EXTRACTION W/PHACO Left 02/06/2018   Procedure: CATARACT EXTRACTION PHACO AND INTRAOCULAR LENS PLACEMENT (IOC);  Surgeon: Jaye Fallow, MD;  Location: [facility];  Service: Ophthalmology;  Laterality: Left;  US  00:31.5 CDE 3.49 Fluid pack Lot # [id number]   CATARACT EXTRACTION W/PHACO Right 11/03/2021   Procedure: CATARACT EXTRACTION PHACO AND INTRAOCULAR LENS PLACEMENT (IOC) RIGHT 5.83 00:35.4;  Surgeon: Jaye Fallow, MD;  Location: [facility];  Service: Ophthalmology;  Laterality: Right;   COLON SURGERY  2004   colon removed   CTR     EXTRACORPOREAL SHOCK WAVE LITHOTRIPSY Right 02/26/2015   Procedure: EXTRACORPOREAL SHOCK WAVE LITHOTRIPSY (ESWL);  Surgeon: Charlie JONETTA Pack, MD;  Location: [facility] OR[facility];  Service: Urology;  Laterality: Right;   REDUCTION MAMMAPLASTY Bilateral 2005    Medical History: Past Medical History:  Diagnosis Date   Anxiety 11/21/2013   Arthritis    Breath shortness 11/21/2013   Crohn disease (HCC)    Dizziness 11/21/2013   Extensor tenosynovitis of wrist 12/24/2013   Ganglion cyst of wrist 12/24/2013   Hypotension, postural 11/22/2013   Psoriasis    Skin cancer  Thyroid  disease     Family History: Family History  Problem Relation Age of Onset   Diabetes Father    Diabetes Mother    Heart disease Maternal Grandmother    Breast cancer Neg Hx     Social History   Socioeconomic History   Marital status: Married    Spouse name: Not on file   Number of children: Not on file   Years of education: Not on file   Highest education level: Not on file  Occupational History   Not on file  Tobacco Use   Smoking status: Former   Smokeless tobacco: Never  Vaping Use   Vaping status: Never Used   Substance and Sexual Activity   Alcohol use: Yes    Alcohol/week: 1.0 standard drink of alcohol    Types: 1 Standard drinks or equivalent per week    Comment: occasional drink, once a month   Drug use: No   Sexual activity: Yes    Partners: Male  Other Topics Concern   Not on file  Social History Narrative   Not on file   Social Drivers of Health   Financial Resource Strain: Not on file  Food Insecurity: Not on file  Transportation Needs: Not on file  Physical Activity: Not on file  Stress: Not on file  Social Connections: Not on file  Intimate Partner Violence: Not on file      Review of Systems  Constitutional:  Positive for appetite change and unexpected weight change. Negative for chills and fatigue.  HENT:  Negative for congestion, rhinorrhea, sneezing and sore throat.   Eyes:  Negative for redness.  Respiratory:  Negative for cough, chest tightness, shortness of breath and wheezing.   Cardiovascular: Negative.  Negative for chest pain and palpitations.  Gastrointestinal:  Negative for abdominal pain, constipation, diarrhea, nausea and vomiting.  Genitourinary:  Negative for dysuria and frequency.  Musculoskeletal:  Positive for arthralgias, back pain and neck pain. Negative for joint swelling.  Skin:  Negative for rash.  Neurological: Negative.  Negative for tremors and numbness.  Hematological:  Negative for adenopathy. Does not bruise/bleed easily.  Psychiatric/Behavioral:  Negative for behavioral problems (Depression), sleep disturbance and suicidal ideas. The patient is not nervous/anxious.     Vital Signs: BP 131/72   Pulse 73   Temp (!) 97.4 F (36.3 C)   Resp 16   Ht 5' 9 (1.753 m)   Wt 176 lb 6.4 oz (80 kg) Comment: at home  SpO2 99%   BMI 26.05 kg/m    Physical Exam Vitals reviewed.  Constitutional:      General: Ruth Morales is not in acute distress.    Appearance: Normal appearance. Ruth Morales is not ill-appearing.  HENT:     Head: Normocephalic and  atraumatic.  Eyes:     Pupils: Pupils are equal, round, and reactive to light.  Cardiovascular:     Rate and Rhythm: Normal rate and regular rhythm.  Pulmonary:     Effort: Pulmonary effort is normal. No respiratory distress.  Skin:    General: Skin is warm and dry.     Capillary Refill: Capillary refill takes less than 2 seconds.  Neurological:     Mental Status: Ruth Morales is alert and oriented to person, place, and time.  Psychiatric:        Mood and Affect: Mood normal.        Behavior: Behavior normal.        Assessment/Plan: 1. Prediabetes (Primary) Continue working on Ruth  Morales which will help improve Ruth Morales a1c  2. Vasomotor symptoms due to menopause Continue climara  patch and provera  tablets as prescribed.   3. Overweight with body mass index (BMI) of 26 to 26.9 in adult Discontinue phentermine , try benzphetamine  as prescribed.  - Benzphetamine  HCl 50 MG TABS; Take 0.5 tablets (25 mg total) by mouth daily before breakfast.  Dispense: 15 tablet; Refill: 0  4. Family history of heart disease in female family member before age 67 Significant family history of heart disease but no personal history  5. Family history of heart disease in female family member before age 4 Significant family history of heart disease but no personal history   General Counseling: Ruth Morales verbalizes understanding of the findings of todays visit and agrees with plan of treatment. I have discussed any further diagnostic evaluation that may be needed or ordered today. We also reviewed Ruth Morales medications today. Ruth Morales has been encouraged to call the office with any questions or concerns that should arise related to todays visit.    No orders of the defined types were placed in this encounter.   Meds ordered this encounter  Medications   Benzphetamine  HCl 50 MG TABS    Sig: Take 0.5 tablets (25 mg total) by mouth daily before breakfast.    Dispense:  15 tablet    Refill:  0    Fill new script today,  patient will have goodrx coupon, do not run through insurance    Return in about 8 weeks (around 02/09/2024) for F/U, Ruth Morales PCP, eval new med.   Total time spent:30 Minutes Time spent includes review of chart, medications, test results, and follow up plan with the patient.   [place] Controlled Substance Database was reviewed by me.  This patient was seen by Ruth Maxin, FNP-C in collaboration with Dr. Sigrid Bathe as a part of collaborative care agreement.   Edrick Whitehorn R. Maxin, MSN, FNP-C Internal medicine

## 2023-12-20 ENCOUNTER — Other Ambulatory Visit: Payer: Self-pay | Admitting: Nurse Practitioner

## 2023-12-20 DIAGNOSIS — E663 Overweight: Secondary | ICD-10-CM

## 2023-12-20 NOTE — Telephone Encounter (Signed)
 Please review this med is backorder

## 2023-12-21 ENCOUNTER — Other Ambulatory Visit: Payer: Self-pay | Admitting: Nurse Practitioner

## 2023-12-21 MED ORDER — PHENTERMINE HCL 37.5 MG PO TABS: 37.5000 mg | ORAL_TABLET | Freq: Every day | ORAL | 1 refills | Status: DC

## 2023-12-22 MED ORDER — PHENTERMINE HCL 37.5 MG PO TABS: 37.5000 mg | ORAL_TABLET | Freq: Every day | ORAL | 1 refills | Status: DC

## 2024-01-16 DIAGNOSIS — Z0120 Encounter for dental examination and cleaning without abnormal findings: Secondary | ICD-10-CM | POA: Diagnosis not present

## 2024-01-23 ENCOUNTER — Other Ambulatory Visit: Payer: Self-pay | Admitting: Nurse Practitioner

## 2024-01-23 DIAGNOSIS — Z1231 Encounter for screening mammogram for malignant neoplasm of breast: Secondary | ICD-10-CM

## 2024-02-09 ENCOUNTER — Encounter: Payer: Self-pay | Admitting: Nurse Practitioner

## 2024-02-09 ENCOUNTER — Ambulatory Visit: Admitting: Nurse Practitioner

## 2024-02-09 VITALS — BP 121/79 | HR 75 | Temp 97.0°F | Resp 16 | Ht 69.0 in | Wt 176.3 lb

## 2024-02-09 DIAGNOSIS — E663 Overweight: Secondary | ICD-10-CM

## 2024-02-09 DIAGNOSIS — R7303 Prediabetes: Secondary | ICD-10-CM | POA: Diagnosis not present

## 2024-02-09 DIAGNOSIS — N951 Menopausal and female climacteric states: Secondary | ICD-10-CM

## 2024-02-09 DIAGNOSIS — G4709 Other insomnia: Secondary | ICD-10-CM | POA: Diagnosis not present

## 2024-02-09 DIAGNOSIS — Z6826 Body mass index (BMI) 26.0-26.9, adult: Secondary | ICD-10-CM

## 2024-02-09 MED ORDER — PHENDIMETRAZINE TARTRATE ER 105 MG PO CP24: 105.0000 mg | ORAL_CAPSULE | Freq: Every day | ORAL | 1 refills | Status: DC

## 2024-02-09 NOTE — Progress Notes (Deleted)
 [facility] [address] [place], [place] [address]  Internal MEDICINE  Office Visit Note  Patient Name: Ruth Morales  [id number]  [id number]  Date of Service: 02/09/2024  Chief Complaint  Patient presents with   Follow-up    HPI Ruth Morales presents for an annual well visit and physical exam.  Well-appearing 63 y.o. female/female with [PMH]  Routine CRC screening: Routine mammogram: DEXA scan: Pap smear: Eye exam and/or foot exam: Labs:  New or worsening pain: Other concerns:      No data to display          Functional Status Survey:       11/18/2020    8:33 AM 05/06/2021    9:11 AM 09/17/2021   10:42 AM 05/10/2022   10:07 AM 06/20/2023    8:33 AM  Fall Risk  Falls in the past year? 0 0 0 0 0  Was there an injury with Fall?    0 0  Fall Risk Category Calculator    0 0  Patient at Risk for Falls Due to No Fall Risks   No Fall Risks No Fall Risks  Fall risk Follow up Falls evaluation completed    Falls evaluation completed Falls evaluation completed     Data saved with a previous flowsheet row definition       06/20/2023    8:34 AM  Depression screen PHQ 2/9  Decreased Interest 0  Down, Depressed, Hopeless 0  PHQ - 2 Score 0        No data to display            Current Medication: Outpatient Encounter Medications as of 02/09/2024  Medication Sig   Calcium Citrate-Vitamin D3 315-6.25 MG-MCG TABS Take by mouth.   celecoxib (CELEBREX) 100 MG capsule Take 100 mg by mouth 2 (two) times daily.   estradiol  (CLIMARA  - DOSED IN MG/24 HR) 0.05 mg/24hr patch Place 1 patch (0.05 mg total) onto the skin once a week.   loperamide  (IMODIUM ) 2 MG capsule Take 4 mg by mouth 2 (two) times daily.   medroxyPROGESTERone  (PROVERA ) 2.5 MG tablet TAKE 1 TABLET BY MOUTH EVERY DAY   phentermine  (ADIPEX-P ) 37.5 MG tablet Take 1 tablet (37.5 mg total) by mouth daily before breakfast.   progesterone  (PROMETRIUM ) 100 MG capsule Take 100 mg by mouth daily. (Patient not  taking: Reported on 02/09/2024)   No facility-administered encounter medications on file as of 02/09/2024.    Surgical History: Past Surgical History:  Procedure Laterality Date   BREAST BIOPSY Left 2003   needle bx-neg   BREAST SURGERY Bilateral 2004   breast reduction   CATARACT EXTRACTION W/PHACO Left 02/06/2018   Procedure: CATARACT EXTRACTION PHACO AND INTRAOCULAR LENS PLACEMENT (IOC);  Surgeon: Jaye Fallow, MD;  Location: [facility];  Service: Ophthalmology;  Laterality: Left;  US  00:31.5 CDE 3.49 Fluid pack Lot # [id number]   CATARACT EXTRACTION W/PHACO Right 11/03/2021   Procedure: CATARACT EXTRACTION PHACO AND INTRAOCULAR LENS PLACEMENT (IOC) RIGHT 5.83 00:35.4;  Surgeon: Jaye Fallow, MD;  Location: [facility];  Service: Ophthalmology;  Laterality: Right;   COLON SURGERY  2004   colon removed   CTR     EXTRACORPOREAL SHOCK WAVE LITHOTRIPSY Right 02/26/2015   Procedure: EXTRACORPOREAL SHOCK WAVE LITHOTRIPSY (ESWL);  Surgeon: Charlie JONETTA Pack, MD;  Location: [facility];  Service: Urology;  Laterality: Right;   REDUCTION MAMMAPLASTY Bilateral 2005    Medical History: Past Medical History:  Diagnosis Date   Anxiety 11/21/2013  Arthritis    Breath shortness 11/21/2013   Crohn disease (HCC)    Dizziness 11/21/2013   Extensor tenosynovitis of wrist 12/24/2013   Ganglion cyst of wrist 12/24/2013   Hypotension, postural 11/22/2013   Psoriasis    Skin cancer    Thyroid  disease     Family History: Family History  Problem Relation Age of Onset   Diabetes Father    Diabetes Mother    Heart disease Maternal Grandmother    Breast cancer Neg Hx     Social History   Socioeconomic History   Marital status: Married    Spouse name: Not on file   Number of children: Not on file   Years of education: Not on file   Highest education level: Not on file  Occupational History   Not on file  Tobacco Use   Smoking status: Former   Smokeless tobacco: Never   Vaping Use   Vaping status: Never Used  Substance and Sexual Activity   Alcohol use: Yes    Alcohol/week: 1.0 standard drink of alcohol    Types: 1 Standard drinks or equivalent per week    Comment: occasional drink, once a month   Drug use: No   Sexual activity: Yes    Partners: Male  Other Topics Concern   Not on file  Social History Narrative   Not on file   Social Drivers of Health   Financial Resource Strain: Not on file  Food Insecurity: Not on file  Transportation Needs: Not on file  Physical Activity: Not on file  Stress: Not on file  Social Connections: Not on file  Intimate Partner Violence: Not on file      Review of Systems  Vital Signs: BP 121/79   Pulse 75   Temp (!) 97 F (36.1 C)   Resp 16   Ht 5' 9 (1.753 m)   Wt 178 lb 3.2 oz (80.8 kg)   BMI 26.32 kg/m    Physical Exam     Assessment/Plan: There are no diagnoses linked to this encounter.    General Counseling: Ruth Morales verbalizes understanding of the findings of todays visit and agrees with plan of treatment. I have discussed any further diagnostic evaluation that may be needed or ordered today. We also reviewed her medications today. she has been encouraged to call the office with any questions or concerns that should arise related to todays visit.    No orders of the defined types were placed in this encounter.   No orders of the defined types were placed in this encounter.   No follow-ups on file.   Total time spent:*** Minutes Time spent includes review of chart, medications, test results, and follow up plan with the patient.    Controlled Substance Database was reviewed by me.  This patient was seen by Mardy Maxin, FNP-C in collaboration with Dr. Sigrid Bathe as a part of collaborative care agreement.  Dakin Madani R. Maxin, MSN, FNP-C Internal medicine

## 2024-02-09 NOTE — Progress Notes (Signed)
 [facility] [address] [place], [place] [address]  Internal MEDICINE  Office Visit Note  Patient Name: Ruth Morales  [id number]  [id number]  Date of Service: 02/09/2024  Chief Complaint  Patient presents with   Follow-up    HPI [name] presents for a follow-up visit for weight loss, menopause and insomnia.  Weight loss -- not losing any significant amount of weight on current medication. Not gaining any weight either.  Sleep maintenance insomnia -- takes magnesium which helps her fall asleep but it does not help her stay asleep.  Wants to check cortisol level    Current Medication: Outpatient Encounter Medications as of 02/09/2024  Medication Sig   Phendimetrazine  Tartrate 105 MG CP24 Take 1 capsule (105 mg total) by mouth daily before breakfast.   Calcium Citrate-Vitamin D3 315-6.25 MG-MCG TABS Take by mouth.   celecoxib (CELEBREX) 100 MG capsule Take 100 mg by mouth 2 (two) times daily.   estradiol  (CLIMARA  - DOSED IN MG/24 HR) 0.05 mg/24hr patch Place 1 patch (0.05 mg total) onto the skin once a week.   loperamide  (IMODIUM ) 2 MG capsule Take 4 mg by mouth 2 (two) times daily.   medroxyPROGESTERone  (PROVERA ) 2.5 MG tablet TAKE 1 TABLET BY MOUTH EVERY DAY   phentermine  (ADIPEX-P ) 37.5 MG tablet Take 1 tablet (37.5 mg total) by mouth daily before breakfast.   progesterone  (PROMETRIUM ) 100 MG capsule Take 100 mg by mouth daily. (Patient not taking: Reported on 02/09/2024)   No facility-administered encounter medications on file as of 02/09/2024.    Surgical History: Past Surgical History:  Procedure Laterality Date   BREAST BIOPSY Left 2003   needle bx-neg   BREAST SURGERY Bilateral 2004   breast reduction   CATARACT EXTRACTION W/PHACO Left 02/06/2018   Procedure: CATARACT EXTRACTION PHACO AND INTRAOCULAR LENS PLACEMENT (IOC);  Surgeon: Jaye Fallow, MD;  Location: [facility];  Service: Ophthalmology;  Laterality: Left;  US  00:31.5 CDE 3.49 Fluid pack Lot #  [id number]   CATARACT EXTRACTION W/PHACO Right 11/03/2021   Procedure: CATARACT EXTRACTION PHACO AND INTRAOCULAR LENS PLACEMENT (IOC) RIGHT 5.83 00:35.4;  Surgeon: Jaye Fallow, MD;  Location: [facility];  Service: Ophthalmology;  Laterality: Right;   COLON SURGERY  2004   colon removed   CTR     EXTRACORPOREAL SHOCK WAVE LITHOTRIPSY Right 02/26/2015   Procedure: EXTRACORPOREAL SHOCK WAVE LITHOTRIPSY (ESWL);  Surgeon: Charlie JONETTA Pack, MD;  Location: [facility];  Service: Urology;  Laterality: Right;   REDUCTION MAMMAPLASTY Bilateral 2005    Medical History: Past Medical History:  Diagnosis Date   Anxiety 11/21/2013   Arthritis    Breath shortness 11/21/2013   Crohn disease (HCC)    Dizziness 11/21/2013   Extensor tenosynovitis of wrist 12/24/2013   Ganglion cyst of wrist 12/24/2013   Hypotension, postural 11/22/2013   Psoriasis    Skin cancer    Thyroid  disease     Family History: Family History  Problem Relation Age of Onset   Diabetes Father    Diabetes Mother    Heart disease Maternal Grandmother    Breast cancer Neg Hx     Social History   Socioeconomic History   Marital status: Married    Spouse name: Not on file   Number of children: Not on file   Years of education: Not on file   Highest education level: Not on file  Occupational History   Not on file  Tobacco Use   Smoking status: Former   Smokeless tobacco:  Never  Vaping Use   Vaping status: Never Used  Substance and Sexual Activity   Alcohol use: Yes    Alcohol/week: 1.0 standard drink of alcohol    Types: 1 Standard drinks or equivalent per week    Comment: occasional drink, once a month   Drug use: No   Sexual activity: Yes    Partners: Male  Other Topics Concern   Not on file  Social History Narrative   Not on file   Social Drivers of Health   Financial Resource Strain: Not on file  Food Insecurity: Not on file  Transportation Needs: Not on file  Physical Activity: Not on file   Stress: Not on file  Social Connections: Not on file  Intimate Partner Violence: Not on file      Review of Systems  Constitutional:  Positive for appetite change and unexpected weight change. Negative for chills and fatigue.  HENT:  Negative for congestion, rhinorrhea, sneezing and sore throat.   Eyes:  Negative for redness.  Respiratory:  Negative for cough, chest tightness, shortness of breath and wheezing.   Cardiovascular: Negative.  Negative for chest pain and palpitations.  Gastrointestinal:  Negative for abdominal pain, constipation, diarrhea, nausea and vomiting.  Genitourinary:  Negative for dysuria and frequency.  Musculoskeletal:  Positive for arthralgias, back pain and neck pain. Negative for joint swelling.  Skin:  Negative for rash.  Neurological: Negative.  Negative for tremors and numbness.  Hematological:  Negative for adenopathy. Does not bruise/bleed easily.  Psychiatric/Behavioral:  Negative for behavioral problems (Depression), sleep disturbance and suicidal ideas. The patient is not nervous/anxious.     Vital Signs: BP 121/79   Pulse 75   Temp (!) 97 F (36.1 C)   Resp 16   Ht 5' 9 (1.753 m)   Wt 176 lb 4.8 oz (80 kg) Comment: weight on scale at home this morning  BMI 26.03 kg/m    Physical Exam Vitals reviewed.  Constitutional:      General: She is not in acute distress.    Appearance: Normal appearance. She is not ill-appearing.  HENT:     Head: Normocephalic and atraumatic.  Eyes:     Pupils: Pupils are equal, round, and reactive to light.  Cardiovascular:     Rate and Rhythm: Normal rate and regular rhythm.  Pulmonary:     Effort: Pulmonary effort is normal. No respiratory distress.  Skin:    General: Skin is warm and dry.     Capillary Refill: Capillary refill takes less than 2 seconds.  Neurological:     Mental Status: She is alert and oriented to person, place, and time.  Psychiatric:        Mood and Affect: Mood normal.         Behavior: Behavior normal.        Assessment/Plan: 1. Prediabetes (Primary) Working on weight loss with appetite suppressants. Her insurance does not cover GLP-1 injectables for weight loss. Weight loss will improve her A1c.  - Phendimetrazine  Tartrate 105 MG CP24; Take 1 capsule (105 mg total) by mouth daily before breakfast.  Dispense: 30 capsule; Refill: 1 - Cortisol-am, blood  2. Other insomnia Discussed trying OTC doxylamine and/or melatonin first.   3. Vasomotor symptoms due to menopause Lab ordered  - Cortisol-am, blood  4. Overweight with body mass index (BMI) of 26 to 26.9 in adult Lab ordered. Discontinue phentermine  and start phendimetrazine  as prescribed. Follow up in 8 weeks for weight in  - Phendimetrazine   Tartrate 105 MG CP24; Take 1 capsule (105 mg total) by mouth daily before breakfast.  Dispense: 30 capsule; Refill: 1 - Cortisol-am, blood   General Counseling: Amaziah verbalizes understanding of the findings of todays visit and agrees with plan of treatment. I have discussed any further diagnostic evaluation that may be needed or ordered today. We also reviewed her medications today. she has been encouraged to call the office with any questions or concerns that should arise related to todays visit.    Orders Placed This Encounter  Procedures   Cortisol-am, blood    Meds ordered this encounter  Medications   Phendimetrazine  Tartrate 105 MG CP24    Sig: Take 1 capsule (105 mg total) by mouth daily before breakfast.    Dispense:  30 capsule    Refill:  1    Fill new script today, discontinue phentermine .    Return in about 7 weeks (around 04/01/2024) for F/U, eval new med, Labs, Elliyah Liszewski PCP.   Total time spent:30 Minutes Time spent includes review of chart, medications, test results, and follow up plan with the patient.   [place] Controlled Substance Database was reviewed by me.  This patient was seen by Mardy Maxin, FNP-C in collaboration with Dr. Sigrid Bathe as a part of collaborative care agreement.   Teresea Donley R. Maxin, MSN, FNP-C Internal medicine

## 2024-02-13 ENCOUNTER — Telehealth: Payer: Self-pay

## 2024-02-13 DIAGNOSIS — R7303 Prediabetes: Secondary | ICD-10-CM

## 2024-02-13 DIAGNOSIS — Z6826 Body mass index (BMI) 26.0-26.9, adult: Secondary | ICD-10-CM

## 2024-02-13 DIAGNOSIS — H5213 Myopia, bilateral: Secondary | ICD-10-CM | POA: Diagnosis not present

## 2024-02-14 MED ORDER — PHENDIMETRAZINE TARTRATE ER 105 MG PO CP24: 105.0000 mg | ORAL_CAPSULE | Freq: Every day | ORAL | 1 refills | Status: DC

## 2024-02-15 ENCOUNTER — Other Ambulatory Visit: Payer: Self-pay | Admitting: Nurse Practitioner

## 2024-02-15 DIAGNOSIS — E663 Overweight: Secondary | ICD-10-CM | POA: Diagnosis not present

## 2024-02-15 DIAGNOSIS — L570 Actinic keratosis: Secondary | ICD-10-CM | POA: Diagnosis not present

## 2024-02-15 DIAGNOSIS — Z859 Personal history of malignant neoplasm, unspecified: Secondary | ICD-10-CM | POA: Diagnosis not present

## 2024-02-15 DIAGNOSIS — Z6826 Body mass index (BMI) 26.0-26.9, adult: Secondary | ICD-10-CM | POA: Diagnosis not present

## 2024-02-15 DIAGNOSIS — Z872 Personal history of diseases of the skin and subcutaneous tissue: Secondary | ICD-10-CM | POA: Diagnosis not present

## 2024-02-15 DIAGNOSIS — R7303 Prediabetes: Secondary | ICD-10-CM | POA: Diagnosis not present

## 2024-02-15 DIAGNOSIS — N951 Menopausal and female climacteric states: Secondary | ICD-10-CM

## 2024-02-15 DIAGNOSIS — M810 Age-related osteoporosis without current pathological fracture: Secondary | ICD-10-CM

## 2024-02-15 DIAGNOSIS — L578 Other skin changes due to chronic exposure to nonionizing radiation: Secondary | ICD-10-CM | POA: Diagnosis not present

## 2024-02-15 DIAGNOSIS — L400 Psoriasis vulgaris: Secondary | ICD-10-CM | POA: Diagnosis not present

## 2024-02-16 LAB — CORTISOL-AM, BLOOD: Cortisol - AM: 9.2 ug/dL (ref 6.2–19.4)

## 2024-02-16 NOTE — Telephone Encounter (Signed)
 Patient notified

## 2024-02-23 DIAGNOSIS — H26493 Other secondary cataract, bilateral: Secondary | ICD-10-CM | POA: Diagnosis not present

## 2024-02-29 ENCOUNTER — Ambulatory Visit
Admission: RE | Admit: 2024-02-29 | Discharge: 2024-02-29 | Disposition: A | Discharge status: Home / Self Care | Source: Ambulatory Visit | Attending: Nurse Practitioner | Admitting: Nurse Practitioner

## 2024-02-29 DIAGNOSIS — Z1231 Encounter for screening mammogram for malignant neoplasm of breast: Secondary | ICD-10-CM | POA: Insufficient documentation

## 2024-02-29 DIAGNOSIS — H26493 Other secondary cataract, bilateral: Secondary | ICD-10-CM | POA: Diagnosis not present

## 2024-03-03 ENCOUNTER — Encounter: Payer: Self-pay | Admitting: Nurse Practitioner

## 2024-03-05 ENCOUNTER — Other Ambulatory Visit: Payer: Self-pay | Admitting: Nurse Practitioner

## 2024-03-05 DIAGNOSIS — R928 Other abnormal and inconclusive findings on diagnostic imaging of breast: Secondary | ICD-10-CM

## 2024-03-06 DIAGNOSIS — H26493 Other secondary cataract, bilateral: Secondary | ICD-10-CM | POA: Diagnosis not present

## 2024-03-14 ENCOUNTER — Ambulatory Visit
Admission: RE | Admit: 2024-03-14 | Discharge: 2024-03-14 | Disposition: A | Source: Ambulatory Visit | Attending: Nurse Practitioner

## 2024-03-14 DIAGNOSIS — R92322 Mammographic fibroglandular density, left breast: Secondary | ICD-10-CM | POA: Diagnosis not present

## 2024-03-14 DIAGNOSIS — R928 Other abnormal and inconclusive findings on diagnostic imaging of breast: Secondary | ICD-10-CM

## 2024-03-22 ENCOUNTER — Telehealth: Payer: Self-pay

## 2024-03-22 NOTE — Telephone Encounter (Signed)
 Patient called stating that the Phendimetrazine  was 30 something dollar last month and now this month is 73, Told patient to try to google for goodrx coupon and if that doesn't bring the price down to call us  back and we can do something different.

## 2024-04-05 ENCOUNTER — Encounter: Payer: Self-pay | Admitting: Nurse Practitioner

## 2024-04-05 ENCOUNTER — Ambulatory Visit: Admitting: Nurse Practitioner

## 2024-04-05 VITALS — BP 135/65 | HR 74 | Temp 97.6°F | Resp 16 | Ht 68.0 in | Wt 181.0 lb

## 2024-04-05 DIAGNOSIS — R7303 Prediabetes: Secondary | ICD-10-CM

## 2024-04-05 DIAGNOSIS — N951 Menopausal and female climacteric states: Secondary | ICD-10-CM

## 2024-04-05 DIAGNOSIS — M810 Age-related osteoporosis without current pathological fracture: Secondary | ICD-10-CM

## 2024-04-05 DIAGNOSIS — Z6827 Body mass index (BMI) 27.0-27.9, adult: Secondary | ICD-10-CM

## 2024-04-05 DIAGNOSIS — E663 Overweight: Secondary | ICD-10-CM | POA: Diagnosis not present

## 2024-04-05 MED ORDER — PHENDIMETRAZINE TARTRATE ER 105 MG PO CP24: 105.0000 mg | ORAL_CAPSULE | Freq: Every day | ORAL | 0 refills | Status: DC

## 2024-04-05 NOTE — Progress Notes (Signed)
 [facility] [address] [place], [place] [address]  Internal MEDICINE  Office Visit Note  Patient Name: Ruth Morales  [id number]  [id number]  Date of Service: 04/05/2024  Chief Complaint  Patient presents with   Follow-up    HPI Ruth Morales presents for a follow-up visit for prediabetes, weight loss and vasomotor symptoms of menopause.  Weight loss -- interested in trying wegovy next year since it will be covered by medicare. Has gaiend a few lbs over the holidays  Prediabetes -- working on low calorie low carb diet.  Vasomotor symptoms -- she is on climara  patch and provera  tablet.     Current Medication: Outpatient Encounter Medications as of 04/05/2024  Medication Sig   Calcium Citrate-Vitamin D3 315-6.25 MG-MCG TABS Take by mouth.   celecoxib (CELEBREX) 100 MG capsule Take 100 mg by mouth 2 (two) times daily.   estradiol  (CLIMARA  - DOSED IN MG/24 HR) 0.05 mg/24hr patch Place 1 patch (0.05 mg total) onto the skin once a week.   loperamide  (IMODIUM ) 2 MG capsule Take 4 mg by mouth 2 (two) times daily.   medroxyPROGESTERone  (PROVERA ) 2.5 MG tablet TAKE 1 TABLET BY MOUTH EVERY DAY   [DISCONTINUED] Phendimetrazine  Tartrate 105 MG CP24 Take 1 capsule (105 mg total) by mouth daily before breakfast.   Phendimetrazine  Tartrate 105 MG CP24 Take 1 capsule (105 mg total) by mouth daily before breakfast.   [DISCONTINUED] progesterone  (PROMETRIUM ) 100 MG capsule Take 100 mg by mouth daily. (Patient not taking: Reported on 04/05/2024)   No facility-administered encounter medications on file as of 04/05/2024.    Surgical History: Past Surgical History:  Procedure Laterality Date   BREAST BIOPSY Left 2003   needle bx-neg   BREAST SURGERY Bilateral 2004   breast reduction   CATARACT EXTRACTION W/PHACO Left 02/06/2018   Procedure: CATARACT EXTRACTION PHACO AND INTRAOCULAR LENS PLACEMENT (IOC);  Surgeon: Jaye Fallow, MD;  Location: [facility];  Service: Ophthalmology;   Laterality: Left;  US  00:31.5 CDE 3.49 Fluid pack Lot # [id number]   CATARACT EXTRACTION W/PHACO Right 11/03/2021   Procedure: CATARACT EXTRACTION PHACO AND INTRAOCULAR LENS PLACEMENT (IOC) RIGHT 5.83 00:35.4;  Surgeon: Jaye Fallow, MD;  Location: [facility];  Service: Ophthalmology;  Laterality: Right;   COLON SURGERY  2004   colon removed   CTR     EXTRACORPOREAL SHOCK WAVE LITHOTRIPSY Right 02/26/2015   Procedure: EXTRACORPOREAL SHOCK WAVE LITHOTRIPSY (ESWL);  Surgeon: Charlie JONETTA Pack, MD;  Location: [facility];  Service: Urology;  Laterality: Right;   REDUCTION MAMMAPLASTY Bilateral 2005    Medical History: Past Medical History:  Diagnosis Date   Anxiety 11/21/2013   Arthritis    Breath shortness 11/21/2013   Crohn disease (HCC)    Dizziness 11/21/2013   Extensor tenosynovitis of wrist 12/24/2013   Ganglion cyst of wrist 12/24/2013   Hypotension, postural 11/22/2013   Psoriasis    Skin cancer    Thyroid  disease     Family History: Family History  Problem Relation Age of Onset   Diabetes Father    Diabetes Mother    Heart disease Maternal Grandmother    Breast cancer Neg Hx     Social History   Socioeconomic History   Marital status: Married    Spouse name: Not on file   Number of children: Not on file   Years of education: Not on file   Highest education level: Not on file  Occupational History   Not on file  Tobacco Use  Smoking status: Former   Smokeless tobacco: Never  Vaping Use   Vaping status: Never Used  Substance and Sexual Activity   Alcohol use: Yes    Alcohol/week: 1.0 standard drink of alcohol    Types: 1 Standard drinks or equivalent per week    Comment: occasional drink, once a month   Drug use: No   Sexual activity: Yes    Partners: Male  Other Topics Concern   Not on file  Social History Narrative   Not on file   Social Drivers of Health   Tobacco Use: Medium Risk (04/05/2024)   Patient History    Smoking Tobacco Use:  Former    Smokeless Tobacco Use: Never    Passive Exposure: Not on Actuary Strain: Not on file  Food Insecurity: Not on file  Transportation Needs: Not on file  Physical Activity: Not on file  Stress: Not on file  Social Connections: Not on file  Intimate Partner Violence: Not on file  Depression (PHQ2-9): Low Risk (04/05/2024)   Depression (PHQ2-9)    PHQ-2 Score: 0  Alcohol Screen: Low Risk (03/23/2023)   Alcohol Screen    Last Alcohol Screening Score (AUDIT): 1  Housing: Not on file  Utilities: Not on file  Health Literacy: Not on file      Review of Systems  Constitutional:  Positive for appetite change and unexpected weight change. Negative for chills and fatigue.  HENT:  Negative for congestion, rhinorrhea, sneezing and sore throat.   Eyes:  Negative for redness.  Respiratory:  Negative for cough, chest tightness, shortness of breath and wheezing.   Cardiovascular: Negative.  Negative for chest pain and palpitations.  Gastrointestinal:  Negative for abdominal pain, constipation, diarrhea, nausea and vomiting.  Genitourinary:  Negative for dysuria and frequency.  Musculoskeletal:  Positive for arthralgias, back pain and neck pain. Negative for joint swelling.  Skin:  Negative for rash.  Neurological: Negative.  Negative for tremors and numbness.  Hematological:  Negative for adenopathy. Does not bruise/bleed easily.  Psychiatric/Behavioral:  Negative for behavioral problems (Depression), sleep disturbance and suicidal ideas. The patient is not nervous/anxious.     Vital Signs: BP 135/65   Pulse 74   Temp 97.6 F (36.4 C)   Resp 16   Ht 5' 8 (1.727 m)   Wt 181 lb (82.1 kg)   SpO2 97%   BMI 27.52 kg/m    Physical Exam Vitals reviewed.  Constitutional:      General: She is not in acute distress.    Appearance: Normal appearance. She is not ill-appearing.  HENT:     Head: Normocephalic and atraumatic.  Eyes:     Pupils: Pupils are equal,  round, and reactive to light.  Cardiovascular:     Rate and Rhythm: Normal rate and regular rhythm.  Pulmonary:     Effort: Pulmonary effort is normal. No respiratory distress.  Skin:    General: Skin is warm and dry.     Capillary Refill: Capillary refill takes less than 2 seconds.  Neurological:     Mental Status: She is alert and oriented to person, place, and time.  Psychiatric:        Mood and Affect: Mood normal.        Behavior: Behavior normal.        Assessment/Plan: 1. Prediabetes (Primary) Continue low carb low calorie diet and increase physical activity as tolerated. - Phendimetrazine  Tartrate 105 MG CP24; Take 1 capsule (105 mg total) by  mouth daily before breakfast.  Dispense: 30 capsule; Refill: 0  2. Vasomotor symptoms due to menopause Continue climara  patch and provera  tablet as prescribed   3. Postmenopausal bone loss Continue climara  patch and provera  tablet as prescribed   4. Overweight with body mass index (BMI) of 27 to 27.9 in adult Continue phendimetrazine  x4 weeks, then we will discuss GLP injections in January.  - Phendimetrazine  Tartrate 105 MG CP24; Take 1 capsule (105 mg total) by mouth daily before breakfast.  Dispense: 30 capsule; Refill: 0   General Counseling: Ruth Morales verbalizes understanding of the findings of todays visit and agrees with plan of treatment. I have discussed any further diagnostic evaluation that may be needed or ordered today. We also reviewed her medications today. she has been encouraged to call the office with any questions or concerns that should arise related to todays visit.    No orders of the defined types were placed in this encounter.   Meds ordered this encounter  Medications   Phendimetrazine  Tartrate 105 MG CP24    Sig: Take 1 capsule (105 mg total) by mouth daily before breakfast.    Dispense:  30 capsule    Refill:  0    Fill new script today, do not run through insurance, patient will bring a goodrx coupon     Return in about 4 weeks (around 05/03/2024) for F/U, Devonte Migues PCP discuss wegovy/zepbound.   Total time spent:30 Minutes Time spent includes review of chart, medications, test results, and follow up plan with the patient.   [place] Controlled Substance Database was reviewed by me.  This patient was seen by Mardy Maxin, FNP-C in collaboration with Dr. Sigrid Bathe as a part of collaborative care agreement.   Gustavo Dispenza R. Maxin, MSN, FNP-C Internal medicine

## 2024-04-26 ENCOUNTER — Other Ambulatory Visit: Payer: Self-pay | Admitting: Nurse Practitioner

## 2024-04-26 DIAGNOSIS — Z6827 Body mass index (BMI) 27.0-27.9, adult: Secondary | ICD-10-CM

## 2024-04-26 DIAGNOSIS — R7303 Prediabetes: Secondary | ICD-10-CM

## 2024-04-26 MED ORDER — PHENDIMETRAZINE TARTRATE ER 105 MG PO CP24: 105.0000 mg | ORAL_CAPSULE | Freq: Every day | ORAL | 0 refills | Status: DC

## 2024-04-26 NOTE — Progress Notes (Signed)
 Medication sent

## 2024-05-03 ENCOUNTER — Ambulatory Visit: Admitting: Nurse Practitioner

## 2024-05-03 ENCOUNTER — Encounter: Payer: Self-pay | Admitting: Nurse Practitioner

## 2024-05-03 VITALS — BP 138/88 | HR 76 | Temp 96.1°F | Resp 16 | Ht 68.0 in | Wt 179.3 lb

## 2024-05-03 DIAGNOSIS — E663 Overweight: Secondary | ICD-10-CM | POA: Diagnosis not present

## 2024-05-03 DIAGNOSIS — R7303 Prediabetes: Secondary | ICD-10-CM | POA: Diagnosis not present

## 2024-05-03 DIAGNOSIS — N951 Menopausal and female climacteric states: Secondary | ICD-10-CM

## 2024-05-03 DIAGNOSIS — Z6827 Body mass index (BMI) 27.0-27.9, adult: Secondary | ICD-10-CM | POA: Diagnosis not present

## 2024-05-03 MED ORDER — PHENDIMETRAZINE TARTRATE ER 105 MG PO CP24: 105.0000 mg | ORAL_CAPSULE | Freq: Every day | ORAL | 1 refills | Status: AC

## 2024-05-03 NOTE — Progress Notes (Signed)
 [facility] [address] [place], [place] [address]  Internal MEDICINE  Office Visit Note  Patient Name: Ruth Morales  [id number]  [id number]  Date of Service: 05/03/2024  Chief Complaint  Patient presents with   Follow-up    HPI Conswella presents for a follow-up visit for weight loss, prediabetes and perimenopause.  Weight loss -- discussed wegovy. Medicare will start covering it in July this year. She has lost a couple of lbs on phendimetrazine . Wants to try sample of wegovy.  Prediabetes -- working on low calorie low carb diet.  Vasomotor symptoms -- she is on climara  patch and provera  tablet and this combination is effective.    Current Medication: Outpatient Encounter Medications as of 05/03/2024  Medication Sig   Calcium Citrate-Vitamin D3 315-6.25 MG-MCG TABS Take by mouth.   celecoxib (CELEBREX) 100 MG capsule Take 100 mg by mouth 2 (two) times daily.   estradiol  (CLIMARA  - DOSED IN MG/24 HR) 0.05 mg/24hr patch Place 1 patch (0.05 mg total) onto the skin once a week.   loperamide  (IMODIUM ) 2 MG capsule Take 4 mg by mouth 2 (two) times daily.   medroxyPROGESTERone  (PROVERA ) 2.5 MG tablet TAKE 1 TABLET BY MOUTH EVERY DAY   Phendimetrazine  Tartrate 105 MG CP24 Take 1 capsule (105 mg total) by mouth daily before breakfast.   [DISCONTINUED] Phendimetrazine  Tartrate 105 MG CP24 Take 1 capsule (105 mg total) by mouth daily before breakfast.   No facility-administered encounter medications on file as of 05/03/2024.    Surgical History: Past Surgical History:  Procedure Laterality Date   BREAST BIOPSY Left 2003   needle bx-neg   BREAST SURGERY Bilateral 2004   breast reduction   CATARACT EXTRACTION W/PHACO Left 02/06/2018   Procedure: CATARACT EXTRACTION PHACO AND INTRAOCULAR LENS PLACEMENT (IOC);  Surgeon: Jaye Fallow, MD;  Location: [facility];  Service: Ophthalmology;  Laterality: Left;  US  00:31.5 CDE 3.49 Fluid pack Lot # [id number]   CATARACT EXTRACTION  W/PHACO Right 11/03/2021   Procedure: CATARACT EXTRACTION PHACO AND INTRAOCULAR LENS PLACEMENT (IOC) RIGHT 5.83 00:35.4;  Surgeon: Jaye Fallow, MD;  Location: [facility];  Service: Ophthalmology;  Laterality: Right;   COLON SURGERY  2004   colon removed   CTR     EXTRACORPOREAL SHOCK WAVE LITHOTRIPSY Right 02/26/2015   Procedure: EXTRACORPOREAL SHOCK WAVE LITHOTRIPSY (ESWL);  Surgeon: Charlie JONETTA Pack, MD;  Location: [facility];  Service: Urology;  Laterality: Right;   REDUCTION MAMMAPLASTY Bilateral 2005    Medical History: Past Medical History:  Diagnosis Date   Anxiety 11/21/2013   Arthritis    Breath shortness 11/21/2013   Crohn disease (HCC)    Dizziness 11/21/2013   Extensor tenosynovitis of wrist 12/24/2013   Ganglion cyst of wrist 12/24/2013   Hypotension, postural 11/22/2013   Psoriasis    Skin cancer    Thyroid  disease     Family History: Family History  Problem Relation Age of Onset   Diabetes Father    Diabetes Mother    Heart disease Maternal Grandmother    Breast cancer Neg Hx     Social History   Socioeconomic History   Marital status: Married    Spouse name: Not on file   Number of children: Not on file   Years of education: Not on file   Highest education level: Not on file  Occupational History   Not on file  Tobacco Use   Smoking status: Former   Smokeless tobacco: Never  Vaping Use  Vaping status: Never Used  Substance and Sexual Activity   Alcohol use: Yes    Alcohol/week: 1.0 standard drink of alcohol    Types: 1 Standard drinks or equivalent per week    Comment: occasional drink, once a month   Drug use: No   Sexual activity: Yes    Partners: Male  Other Topics Concern   Not on file  Social History Narrative   Not on file   Social Drivers of Health   Tobacco Use: Medium Risk (05/03/2024)   Patient History    Smoking Tobacco Use: Former    Smokeless Tobacco Use: Never    Passive Exposure: Not on Surveyor, Minerals Strain: Not on file  Food Insecurity: Not on file  Transportation Needs: Not on file  Physical Activity: Not on file  Stress: Not on file  Social Connections: Not on file  Intimate Partner Violence: Not on file  Depression (PHQ2-9): Low Risk (04/05/2024)   Depression (PHQ2-9)    PHQ-2 Score: 0  Alcohol Screen: Low Risk (03/23/2023)   Alcohol Screen    Last Alcohol Screening Score (AUDIT): 1  Housing: Not on file  Utilities: Not on file  Health Literacy: Not on file      Review of Systems  Constitutional:  Positive for appetite change and unexpected weight change. Negative for chills and fatigue.  HENT:  Negative for congestion, rhinorrhea, sneezing and sore throat.   Eyes:  Negative for redness.  Respiratory:  Negative for cough, chest tightness, shortness of breath and wheezing.   Cardiovascular: Negative.  Negative for chest pain and palpitations.  Gastrointestinal:  Negative for abdominal pain, constipation, diarrhea, nausea and vomiting.  Genitourinary:  Negative for dysuria and frequency.  Musculoskeletal:  Positive for arthralgias, back pain and neck pain. Negative for joint swelling.  Skin:  Negative for rash.  Neurological: Negative.  Negative for tremors and numbness.  Hematological:  Negative for adenopathy. Does not bruise/bleed easily.  Psychiatric/Behavioral:  Negative for behavioral problems (Depression), sleep disturbance and suicidal ideas. The patient is not nervous/anxious.     Vital Signs: BP (!) 140/92   Pulse 76   Temp (!) 96.1 F (35.6 C)   Resp 16   Ht 5' 8 (1.727 m)   Wt 179 lb 4.8 oz (81.3 kg) Comment: at home  SpO2 98%   BMI 27.26 kg/m    Physical Exam Vitals reviewed.  Constitutional:      General: She is not in acute distress.    Appearance: Normal appearance. She is not ill-appearing.  HENT:     Head: Normocephalic and atraumatic.  Eyes:     Pupils: Pupils are equal, round, and reactive to light.  Cardiovascular:      Rate and Rhythm: Normal rate and regular rhythm.  Pulmonary:     Effort: Pulmonary effort is normal. No respiratory distress.  Skin:    General: Skin is warm and dry.     Capillary Refill: Capillary refill takes less than 2 seconds.  Neurological:     Mental Status: She is alert and oriented to person, place, and time.  Psychiatric:        Mood and Affect: Mood normal.        Behavior: Behavior normal.        Assessment/Plan: 1. Prediabetes (Primary) Continue low carb low sugar diet and increase physical activity as tolerated.  - Phendimetrazine  Tartrate 105 MG CP24; Take 1 capsule (105 mg total) by mouth daily before breakfast.  Dispense:  30 capsule; Refill: 1  2. Vasomotor symptoms due to menopause Continue climara  patch and progesterone  tablet.   3. Overweight with body mass index (BMI) of 27 to 27.9 in adult Continue low carb low sugar diet, increase physical activity as tolerated. Try wegovy sample. Take short break from wegovy, may restart after a week or so. Follow up in 1 month - Phendimetrazine  Tartrate 105 MG CP24; Take 1 capsule (105 mg total) by mouth daily before breakfast.  Dispense: 30 capsule; Refill: 1   General Counseling: Sicilia verbalizes understanding of the findings of todays visit and agrees with plan of treatment. I have discussed any further diagnostic evaluation that may be needed or ordered today. We also reviewed her medications today. she has been encouraged to call the office with any questions or concerns that should arise related to todays visit.    No orders of the defined types were placed in this encounter.   Meds ordered this encounter  Medications   Phendimetrazine  Tartrate 105 MG CP24    Sig: Take 1 capsule (105 mg total) by mouth daily before breakfast.    Dispense:  30 capsule    Refill:  1    Fill new script today, do not run through insurance, patient will bring a goodrx coupon    Return in about 1 month (around 06/03/2024) for F/U,  eval new med, Vivan Agostino PCP.   Total time spent:30 Minutes Time spent includes review of chart, medications, test results, and follow up plan with the patient.   [place] Controlled Substance Database was reviewed by me.  This patient was seen by Mardy Maxin, FNP-C in collaboration with Dr. Sigrid Bathe as a part of collaborative care agreement.   Janesia Joswick R. Maxin, MSN, FNP-C Internal medicine

## 2024-06-05 ENCOUNTER — Ambulatory Visit: Admitting: Nurse Practitioner

## 2024-06-20 ENCOUNTER — Encounter: Admitting: Nurse Practitioner
